# Patient Record
Sex: Female | Born: 1935 | Race: White | Hispanic: No | Smoking: Former smoker
Health system: Southern US, Community
[De-identification: ages and names within clinical notes are randomized; demographics above are authoritative.]

## PROBLEM LIST (undated history)

## (undated) DIAGNOSIS — R609 Edema, unspecified: Secondary | ICD-10-CM

## (undated) DIAGNOSIS — G589 Mononeuropathy, unspecified: Secondary | ICD-10-CM

## (undated) DIAGNOSIS — Z9289 Personal history of other medical treatment: Secondary | ICD-10-CM

## (undated) DIAGNOSIS — D649 Anemia, unspecified: Secondary | ICD-10-CM

## (undated) DIAGNOSIS — M1990 Unspecified osteoarthritis, unspecified site: Secondary | ICD-10-CM

## (undated) DIAGNOSIS — Z87442 Personal history of urinary calculi: Secondary | ICD-10-CM

## (undated) DIAGNOSIS — T4145XA Adverse effect of unspecified anesthetic, initial encounter: Secondary | ICD-10-CM

## (undated) DIAGNOSIS — I4891 Unspecified atrial fibrillation: Secondary | ICD-10-CM

## (undated) DIAGNOSIS — E7800 Pure hypercholesterolemia, unspecified: Secondary | ICD-10-CM

## (undated) DIAGNOSIS — H409 Unspecified glaucoma: Secondary | ICD-10-CM

## (undated) DIAGNOSIS — I10 Essential (primary) hypertension: Secondary | ICD-10-CM

## (undated) DIAGNOSIS — T8859XA Other complications of anesthesia, initial encounter: Secondary | ICD-10-CM

## (undated) DIAGNOSIS — I700 Atherosclerosis of aorta: Secondary | ICD-10-CM

## (undated) DIAGNOSIS — C4490 Unspecified malignant neoplasm of skin, unspecified: Secondary | ICD-10-CM

## (undated) DIAGNOSIS — R911 Solitary pulmonary nodule: Secondary | ICD-10-CM

## (undated) DIAGNOSIS — I5032 Chronic diastolic (congestive) heart failure: Secondary | ICD-10-CM

## (undated) DIAGNOSIS — I080 Rheumatic disorders of both mitral and aortic valves: Secondary | ICD-10-CM

## (undated) DIAGNOSIS — E119 Type 2 diabetes mellitus without complications: Secondary | ICD-10-CM

## (undated) HISTORY — DX: Unspecified osteoarthritis, unspecified site: M19.90

## (undated) HISTORY — DX: Essential (primary) hypertension: I10

## (undated) HISTORY — PX: SKIN CANCER EXCISION: SHX779

## (undated) HISTORY — PX: CATARACT EXTRACTION W/ INTRAOCULAR LENS IMPLANT: SHX1309

## (undated) HISTORY — DX: Edema, unspecified: R60.9

## (undated) HISTORY — DX: Atherosclerosis of aorta: I70.0

## (undated) HISTORY — DX: Unspecified atrial fibrillation: I48.91

## (undated) HISTORY — DX: Pure hypercholesterolemia, unspecified: E78.00

## (undated) HISTORY — DX: Rheumatic disorders of both mitral and aortic valves: I08.0

## (undated) HISTORY — DX: Mononeuropathy, unspecified: G58.9

## (undated) HISTORY — DX: Chronic diastolic (congestive) heart failure: I50.32

---
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
## 1998-01-10 ENCOUNTER — Other Ambulatory Visit: Admission: RE | Admit: 1998-01-10 | Discharge: 1998-01-10 | Payer: Self-pay | Admitting: *Deleted

## 1998-03-01 ENCOUNTER — Encounter (HOSPITAL_COMMUNITY): Admission: RE | Admit: 1998-03-01 | Discharge: 1998-05-30 | Payer: Self-pay | Admitting: Cardiovascular Disease

## 1998-03-22 ENCOUNTER — Ambulatory Visit (HOSPITAL_COMMUNITY): Admission: RE | Admit: 1998-03-22 | Discharge: 1998-03-22 | Payer: Self-pay | Admitting: Cardiovascular Disease

## 1998-10-08 HISTORY — PX: ABDOMINAL HYSTERECTOMY: SHX81

## 1999-02-14 ENCOUNTER — Ambulatory Visit (HOSPITAL_COMMUNITY): Admission: RE | Admit: 1999-02-14 | Discharge: 1999-02-14 | Payer: Self-pay | Admitting: Gastroenterology

## 1999-02-14 ENCOUNTER — Encounter: Payer: Self-pay | Admitting: Gastroenterology

## 1999-02-15 ENCOUNTER — Other Ambulatory Visit: Admission: RE | Admit: 1999-02-15 | Discharge: 1999-02-15 | Payer: Self-pay | Admitting: Gynecology

## 1999-02-16 ENCOUNTER — Other Ambulatory Visit: Admission: RE | Admit: 1999-02-16 | Discharge: 1999-02-16 | Payer: Self-pay | Admitting: Obstetrics & Gynecology

## 1999-03-01 ENCOUNTER — Ambulatory Visit (HOSPITAL_COMMUNITY): Admission: RE | Admit: 1999-03-01 | Discharge: 1999-03-01 | Payer: Self-pay | Admitting: Obstetrics and Gynecology

## 1999-06-15 ENCOUNTER — Encounter (INDEPENDENT_AMBULATORY_CARE_PROVIDER_SITE_OTHER): Payer: Self-pay | Admitting: Specialist

## 1999-06-15 ENCOUNTER — Ambulatory Visit (HOSPITAL_COMMUNITY): Admission: RE | Admit: 1999-06-15 | Discharge: 1999-06-15 | Payer: Self-pay | Admitting: Obstetrics and Gynecology

## 1999-08-11 ENCOUNTER — Encounter: Payer: Self-pay | Admitting: Obstetrics and Gynecology

## 1999-08-16 ENCOUNTER — Encounter (INDEPENDENT_AMBULATORY_CARE_PROVIDER_SITE_OTHER): Payer: Self-pay | Admitting: Specialist

## 1999-08-16 ENCOUNTER — Inpatient Hospital Stay (HOSPITAL_COMMUNITY): Admission: RE | Admit: 1999-08-16 | Discharge: 1999-08-18 | Payer: Self-pay | Admitting: Obstetrics and Gynecology

## 2000-09-24 ENCOUNTER — Other Ambulatory Visit: Admission: RE | Admit: 2000-09-24 | Discharge: 2000-09-24 | Payer: Self-pay | Admitting: Obstetrics and Gynecology

## 2001-09-25 ENCOUNTER — Other Ambulatory Visit: Admission: RE | Admit: 2001-09-25 | Discharge: 2001-09-25 | Payer: Self-pay | Admitting: Obstetrics and Gynecology

## 2003-12-28 ENCOUNTER — Other Ambulatory Visit: Admission: RE | Admit: 2003-12-28 | Discharge: 2003-12-28 | Payer: Self-pay | Admitting: Obstetrics and Gynecology

## 2005-08-29 ENCOUNTER — Ambulatory Visit: Payer: Self-pay | Admitting: Cardiovascular Disease

## 2005-09-07 ENCOUNTER — Ambulatory Visit: Payer: Self-pay | Admitting: Cardiovascular Disease

## 2005-09-07 ENCOUNTER — Encounter: Payer: Self-pay | Admitting: Cardiology

## 2005-09-07 ENCOUNTER — Ambulatory Visit: Payer: Self-pay

## 2005-09-10 ENCOUNTER — Ambulatory Visit: Payer: Self-pay | Admitting: Internal Medicine

## 2005-09-24 ENCOUNTER — Ambulatory Visit: Payer: Self-pay | Admitting: Cardiovascular Disease

## 2005-11-02 ENCOUNTER — Ambulatory Visit: Payer: Self-pay | Admitting: Cardiovascular Disease

## 2005-11-14 ENCOUNTER — Encounter: Admission: RE | Admit: 2005-11-14 | Discharge: 2005-11-14 | Payer: Self-pay | Admitting: Family Medicine

## 2006-02-01 ENCOUNTER — Ambulatory Visit: Payer: Self-pay | Admitting: Cardiovascular Disease

## 2006-07-11 ENCOUNTER — Ambulatory Visit: Payer: Self-pay | Admitting: Cardiovascular Disease

## 2006-08-06 ENCOUNTER — Ambulatory Visit: Payer: Self-pay

## 2007-06-05 ENCOUNTER — Ambulatory Visit: Payer: Self-pay | Admitting: Cardiovascular Disease

## 2007-10-09 HISTORY — PX: TOTAL KNEE ARTHROPLASTY: SHX125

## 2007-11-11 ENCOUNTER — Ambulatory Visit: Payer: Self-pay | Admitting: Cardiovascular Disease

## 2008-09-20 ENCOUNTER — Inpatient Hospital Stay (HOSPITAL_COMMUNITY): Admission: RE | Admit: 2008-09-20 | Discharge: 2008-09-24 | Payer: Self-pay | Admitting: Orthopedic Surgery

## 2008-12-15 ENCOUNTER — Ambulatory Visit: Payer: Self-pay | Admitting: Cardiovascular Disease

## 2009-05-07 ENCOUNTER — Encounter: Payer: Self-pay | Admitting: Cardiovascular Disease

## 2009-06-20 DIAGNOSIS — G589 Mononeuropathy, unspecified: Secondary | ICD-10-CM | POA: Insufficient documentation

## 2009-06-20 DIAGNOSIS — N200 Calculus of kidney: Secondary | ICD-10-CM

## 2009-06-20 DIAGNOSIS — I4891 Unspecified atrial fibrillation: Secondary | ICD-10-CM

## 2009-06-20 DIAGNOSIS — E7800 Pure hypercholesterolemia, unspecified: Secondary | ICD-10-CM | POA: Insufficient documentation

## 2009-06-20 DIAGNOSIS — E119 Type 2 diabetes mellitus without complications: Secondary | ICD-10-CM

## 2009-06-20 DIAGNOSIS — I10 Essential (primary) hypertension: Secondary | ICD-10-CM | POA: Insufficient documentation

## 2009-06-20 DIAGNOSIS — R609 Edema, unspecified: Secondary | ICD-10-CM | POA: Insufficient documentation

## 2009-06-20 DIAGNOSIS — I080 Rheumatic disorders of both mitral and aortic valves: Secondary | ICD-10-CM

## 2009-06-20 DIAGNOSIS — M1990 Unspecified osteoarthritis, unspecified site: Secondary | ICD-10-CM | POA: Insufficient documentation

## 2009-06-20 DIAGNOSIS — H409 Unspecified glaucoma: Secondary | ICD-10-CM | POA: Insufficient documentation

## 2009-06-21 ENCOUNTER — Ambulatory Visit: Payer: Self-pay | Admitting: Cardiovascular Disease

## 2009-06-21 DIAGNOSIS — R011 Cardiac murmur, unspecified: Secondary | ICD-10-CM

## 2009-07-05 ENCOUNTER — Ambulatory Visit: Payer: Self-pay

## 2009-07-05 ENCOUNTER — Encounter: Payer: Self-pay | Admitting: Cardiovascular Disease

## 2009-08-24 ENCOUNTER — Encounter: Admission: RE | Admit: 2009-08-24 | Discharge: 2009-08-24 | Payer: Self-pay | Admitting: Obstetrics and Gynecology

## 2010-09-12 ENCOUNTER — Encounter: Payer: Self-pay | Admitting: Cardiovascular Disease

## 2010-09-12 ENCOUNTER — Ambulatory Visit: Payer: Self-pay | Admitting: Cardiovascular Disease

## 2010-10-29 ENCOUNTER — Encounter: Payer: Self-pay | Admitting: Obstetrics and Gynecology
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
## 2011-02-20 NOTE — Assessment & Plan Note (Signed)
[facility]                            CARDIOLOGY OFFICE NOTE   ZEMA, LIZARDO                   MRN:          [id number]  DATE:11/11/2007                            DOB:          09/30/1935    Jonnelle returns today in follow-up, as we have followed her for  hypertension, atrial fibrillation and edema.   She is doing well.  She has no documented coronary disease.  Her last  Myoview of August 06, 2006 was normal, with no evidence of significant  ischemia.  Her EF was 67%.  She continues to work at [employer].  She is doing fairly well.  Her limitations do not have anything to do  with chest pain, palpitations or dyspnea.  She hurt her ankle 3 years  ago and continues to have some problems with the left ankle; she sees  Dr. Leonides Grills for this.  She also has a right knee problem, is  getting injections by Dr. Ollen Gross.   Her review of systems was otherwise remarkable for occasional easy  bleeding from her varicosities in the leg.  She had a particular problem  this morning in the shower, where she had a small venial that bled.  In  general her INRs have been therapeutic, including today at 2.4.   Her review of systems are otherwise negative.   CURRENT MEDICATIONS:  1. Digitek 0.25 daily.  2. Celebrex 200 mg b.i.d.  3. Metanx once a day.  4. Potassium.  5. Carvedilol 25 daily.  6. Cardia 240 daily.  7. Lisinopril 40 daily.  8. Glipizide 10 daily.  9. Actos 30 daily.  10.Coumadin as directed.  11.Demodex 40 Monday, Wednesday and Friday, 20 other days.  12.Lipitor 10 daily.  13.Metformin 500 b.i.d.  14.Lentis 28 units daily.   PHYSICAL EXAMINATION:  Her exam is remarkable for an elderly white  female in no distress.  She has her trademark bright red hair.  Weight  163, blood pressure 138/86.  She is in atrial fibrillation at a rate of  80, respiratory 14, afebrile.  HEENT:  Unremarkable.  NECK:  Carotids are without  bruit, no lymphadenopathy, thyromegaly or  JVP elevation.  LUNGS:  Clear to diaphragmatic motion.  No wheezing.  S1 and S2 are  normal.  Normal heart sounds.  PMI normal.  ABDOMEN:  Benign.  Bowel sounds positive.  No AAA.  No tenderness.  No  hepatosplenomegaly or hepatojugular reflux.  EXTREMITIES:  Distal pulses are intact.  She has trace edema  bilaterally, with marked varicosities particularly behind the left knee  NEUROLOGIC:  Nonfocal.  SKIN:  Warm and dry.   EKG:  Shows atrial fibrillation with digoxin-effect.   IMPRESSION:  1. CHRONIC ATRIAL FIBRILLATION.  Rate control and anticoagulation      going well.  Continue current doses of Digoxin, beta blocker and      calcium blocker.  To follow up in the Coumadin Clinic every 6      weeks.  2. HYPERTENSION.  Currently well controlled.  Continue current      medications, low-salt diet.  3. LOWER EXTREMITY  VARICOSITIES.  I have asked Makayle to see Dr.      Consuello Closs or CVTS in regards to sclerosis.  I think this      would be helpful, particularly in light of the fact she      occasionally has a venial that bleeds.  She has fairly marked      varicosities behind the left knee, and is at risk for      thrombophlebitis.  She does wear support hose, and she will think      about it.  4.  MULTIPLE ORTHOPEDIC PROBLEMS.  Follow up with Dr.      Homero Fellers Aluisio and Dr. Leonides Grills.Meriam Sprague would be a candidate      for knee surgery, if she has to have a replacement.  She was      limping a bit today and she will continue her Celebrex as an anti-      inflammatory; since it works well.  As there is no documented      coronary disease, I will see her back in a year.     Noralyn Pick. Eden Emms, MD, [facility]  Electronically Signed    PCN/MedQ  DD: 11/11/2007  DT: 11/11/2007  Job #: [id number]   cc:   Ollen Gross, M.D.  Leonides Grills, M.D.

## 2011-02-20 NOTE — Op Note (Signed)
NAMETONEE, SILVERSTEIN            ACCOUNT NO.:  [id number]   MEDICAL RECORD NO.:  [id number]          PATIENT TYPE:  INP   LOCATION:  0008                         FACILITY:  [facility]   PHYSICIAN:  Ollen Gross, M.D.    DATE OF BIRTH:  April 18, 1935   DATE OF PROCEDURE:  09/20/2008  DATE OF DISCHARGE:                               OPERATIVE REPORT   PREOPERATIVE DIAGNOSIS:  Osteoarthritis, right knee.   POSTOPERATIVE DIAGNOSIS:  Osteoarthritis, right knee.   PROCEDURE:  Right total knee arthroplasty.   SURGEON:  Ollen Gross, M.D.   ASSISTANT:  Jamelle Rushing, P.A.   ANESTHESIA:  Spinal.   ESTIMATED BLOOD LOSS:  Minimal.   DRAIN:  None.   TOURNIQUET TIME:  37 minutes at 300 mmHg.   COMPLICATIONS:  None.   CONDITION.:  Stable to recovery.   BRIEF CLINICAL NOTE:  Ms. Isaac is a 76 year old female with severe  end-stage arthritis of the right knee with progressively worsening pain  and dysfunction.  She has failed nonoperative management and presents  now for right total knee arthroplasty.   PROCEDURE IN DETAIL:  After successful administration of spinal  anesthetic, a tourniquet was placed on the right thigh,  and right lower  extremity prepped and draped in the usual sterile fashion.  Extremity  was wrapped in Esmarch, knee flexed, tourniquet inflated to 300 mmHg.  Midline incision was made with a 10-blade through subcutaneous tissue to  the level of the extensor mechanism.  A fresh blade was used to make a  medial parapatellar arthrotomy.  Soft tissue over the proximal medial  tibia is subperiosteally elevated to the joint line with the knife and  into the semimembranosus bursa with a Cobb elevator.  Soft tissue  laterally is elevated with attention being paid to avoid the patellar  tendon on tibial tubercle.  Patella was subluxed laterally, knee flexed  90 degrees, and ACL and PCL removed.  Drill was used to create a  starting hole in the distal femur and the canal  was thoroughly  irrigated.  The 5-degree right valgus alignment guide was placed with  referencing of the posterior condyles and the blocks pinned to remove 11  mm off the distal femur.  Distal femoral resection is made with an  oscillating saw.  I took 11 because of preop flexion contracture.  Sizing blocks were placed, size 4 is most appropriate AP but 3 medial  and lateral, thus a 4 narrow will be the appropriate component.  The  size 4 cutting block was placed and the anterior, posterior and chamfer  cuts are made.  The rotation for the cuts was made off the epicondylar  axis.   The tibia was subluxed forward and menisci removed.  Extramedullary  tibial alignment guide is placed referencing proximally at the medial  aspect of the tibial tubercle and distally along the second metatarsal  axis and tibial crest.  The block was pinned to remove about 2 mm from  the more deficient medial side.  The medial side was fairly deficient.  Tibial resection is made with an oscillating saw.  Sizing block  was  placed, size 3 is most appropriate.  The proximal tibia is then prepared  with the modular drill and keel punch for the size 3.  Femoral  preparation is completed with the intercondylar cut for the size 4.   Size 3 mobile bearing tibial trial, size 4 narrow posterior stabilized  femoral trial and a 12.5-mm posterior stabilized rotating platform  insert trial are placed.  There was a little play with the 12.5 so I  went to the 15 which allowed for full extension with excellent varus-  valgus, anterior and posterior balance throughout full range of motion.  We needed the 15 because of the neck tibial cut to get down to the base  of medial defect.  Patella was then everted and thickness measured to be  24 mm.  Freehand resection was taken to 14 mm, 38 template was placed,  lug holes were drilled, trial patella was placed and it tracks normally.  Osteophytes were removed off the posterior femur  with the trial in  place.  All trials were removed and then the cut bone surfaces are  prepared with pulsatile lavage.  Cement was mixed and once ready for  implantation the size 3 mobile bearing tibial tray, size 4 narrow  posterior stabilized femur and 38 patella are cemented into place and  the patella was held with a clamp.  The trial 15-mm insert was placed,  the knee held in full extension, and all extruded cement removed.  When  the cement was fully hardened and the knee permanent, a 15-mm posterior  stabilized rotating platform insert is placed into the tibial tray.  The  wound was then copiously irrigated with saline solution and the FloSeal  injected on the posterior capsule, medial and lateral gutters and  suprapatellar area.  Moist sponge is placed and tourniquet released with  a total time of 37 minutes.  The sponge was held for 2 minutes and then  removed.  Minimal bleeding was encountered.  The bleeding that is  encountered is stopped with electrocautery.  Wounds again irrigated and  then the arthrotomy closed with interrupted #1 PDS.  Flexion against  gravity is 135 degrees.  Subcu was closed with interrupted 2-0 Vicryl,  subcuticular running 4-0 Monocryl.  The incision is cleaned and dried  and Steri-Strips and a bulky sterile dressing are applied.  She was then  placed into a knee immobilizer, awakened, and transported to recovery in  stable condition.      Ollen Gross, M.D.  Electronically Signed     FA/MEDQ  D:  09/20/2008  T:  09/21/2008  Job:  [id number]

## 2011-02-20 NOTE — Assessment & Plan Note (Signed)
[facility]                            CARDIOLOGY OFFICE NOTE   BRADLEE, BRIDGERS                   MRN:          [id number]  DATE:12/15/2008                            DOB:          [birth year]    Destiny [name] returns today for followup.  She has history of hypertension,  MR, atrial fibrillation, lower extremity edema with significant lower  extremity varicosities.  She has been doing well.  She has no documented  coronary disease.  I believe her last Myoview was in 2007 and was  normal.  She is not having chest pain.   Since I last saw her, she had a right knee replaced by Dr. Lequita Halt.   She continues to take p.r.n. Celebrex.  Her rehab has gone well.  She is  walking much better.   She is on chronic Coumadin for atrial fibrillation.  She did not have  any significant problems in regards to DVTs.   Her INRs have been therapeutic.  In regards to her coronary risk  factors, her hypertension is well controlled.  She is on Lipitor for  hypercholesterolemia and oral hypoglycemics.   She needs a followup hemoglobin A1c.   The patient has not had any significant chest pain.  She does not have  palpitations despite being in chronic AFib.  She has mild exertional  dyspnea which is stable.  She has had some MR in the past.  Her last  echo done September 10, 2005, showed an EF of 40-50% with mild mitral  regurgitation.   Since her symptoms have been stable and her EF was 67% by Myoview, we  have not repeated our assessment of LV function.   REVIEW OF SYSTEMS:  Otherwise, remarkable for occasional pain in her  knee, chronic varicosities, and varicose veins in both lower  extremities.  No recent phlebitis.  Improved appetite.  No weight loss.  No palpitations, syncope, PND, or orthopnea.  Trace lower extremity  edema.  No melena.  No bleeding diathesis, on Coumadin.  No change in  mental status.  No TIA or CVA.   Her meds include:  1. Digoxin 0.25 a  day.  2. Cosopt.  3. Klor-Con 20 b.i.d.  4. Metformin 2 g nightly.  5. Carvedilol 25 b.i.d.  6. Hydrochlorothiazide 12.5 a day.  7. Cinnamon.  8. Fish oil.  9. Metanx.  10.Cartia 240 a day.  11.Lisinopril 40 a day.  12.Glipizide 10 a day.  13.Actos 30 a day.  14.Magnesium.  15.Multivitamins.  16.Coumadin as directed.  17.Lipitor 10 a day.  18.Lantus 28 units at night.   PHYSICAL EXAMINATION:  GENERAL:  Remarkable for an animated female with  red hair.  VITAL SIGNS:  Her weight is 164, blood pressure 140/80, pulse 68 and  irregular, respiratory rate 14, afebrile.  HEENT:  Unremarkable.  NECK:  Carotids normal without bruit.  No lymphadenopathy, thyromegaly,  or JVP elevation.  LUNGS:  Clear.  Good diaphragmatic motion.  No wheezing.  HEART:  S1 and S2 with normal heart sounds.  PMI normal.  ABDOMEN:  Benign.  Bowel sounds positive.  No AAA.  No tenderness.  No  bruit.  No hepatosplenomegaly, hepatojugular reflux, or tenderness.  EXTREMITIES:  Distal pulses are intact.  Marked varicosities in both  lower extremities with recent right knee replacement.  NEUROLOGIC:  Nonfocal.  SKIN:  Warm and dry.  MUSCULOSKELETAL:  No muscular weakness.   EKG shows atrial fibrillation with dig effect, nonspecific ST-T wave  changes.   IMPRESSION:  1. Chronic atrial fibrillation.  Good rate control.  Continue      anticoagulation with Coumadin.  Followup in the clinic.  Currently      asymptomatic.  2. Mitral insufficiency.  Murmur not evident on exam.  No need for      followup at this time.  No need for subacute bacterial endocarditis      prophylaxis.  3. History of decreased left ventricular function in the setting of      atrial fibrillation and lower extremity edema.  Symptoms currently      stable.  Consider followup evaluation of LV function by echo in a      year.  4. Superficial varicosities, marked, referred to Dr. Donia Ast in the      [facility].  The patient is somewhat  hesitant to do this since it      is not likely to be covered by insurance.  Long-term risk of      thrombophlebitis, lower extremity edema, and deep venous thrombosis      were discussed.  5. Diabetes.  Followup with primary care MD.  Hemoglobin A1c to be      done quarterly.  6. Status post right knee replacement.  Continue to follow up with      Physical Therapy at Dr. Deri Fuelling office.  7. Lower extremity edema, improved.  Continue hydrochlorothiazide and      potassium replacement.  These were secondary to venous      insufficiency.  Elevate legs at the end of the day.  Low-salt diet.  8. Neuropathy.  Continue Metanx secondary to diabetes.  No nonhealing      ulcers.  No evidence of significant peripheral vascular disease in      regards to her lower extremity symptoms.  9. Abnormal EKG secondary to chronic atrial fibrillation and dig      effect.  No evidence of ischemia with negative Myoview in 2007 and      no evidence of chest pain.      Noralyn Pick. Eden Emms, MD, [facility]  Electronically Signed    PCN/MedQ  DD: 12/15/2008  DT: 12/16/2008  Job #: [id number]

## 2011-02-20 NOTE — H&P (Signed)
Destiny [name], Destiny [name]            ACCOUNT NO.:  [id number]   MEDICAL RECORD NO.:  [id number]          PATIENT TYPE:  INP   LOCATION:                               FACILITY:  [name]   PHYSICIAN:  Ollen Gross, M.D.    DATE OF BIRTH:  [birth year]   DATE OF ADMISSION:  09/10/2008  DATE OF DISCHARGE:                              HISTORY & PHYSICAL   DATE OF OFFICE VISIT HISTORY AND PHYSICAL:  August 26, 2008.   DATE OF ADMISSION:  September 20, 2008.   CHIEF COMPLAINT:  Right knee pain.   HISTORY OF PRESENT ILLNESS:  The patient is a 76 year old female who has  been seen by Dr. Lequita Halt for ongoing right knee pain.  It has been  ongoing for several years now abd has gotten worse with time.  She has  had cortisone injections in the past.  She has also had an arthroscopy  Dr. Luiz Blare back in 2004, which showed arthritis, really did not provide  her any long-term benefit.  She has been seen by Dr. Lequita Halt, found to  have end-stage arthritis, felt to be a good candidate for surgery.  Risks and benefits have been discussed and she elects to proceed with  surgery.  She has been seen preoperatively by Dr. Charlton Haws.  It is  felt she would be a candidate for knee surgery.   ALLERGIES:  No known drug allergies.   CURRENT MEDICATIONS:  Actos, metformin, Lipitor, Xalatan, Cosopt,  Lantus, calcium plus D, magnesium, Darvocet, Coumadin, glipizide ER,  lisinopril, potassium, hydrochlorothiazide, Diltiazem, digoxin,  carvedilol, multivitamin, vitamin E and cinnamon.   PAST MEDICAL HISTORY:  1. Hypertension.  2. Coronary arterial disease.  3. Hypercholesterolemia.  4. Glaucoma.  5. Past history of cataracts.  6. Insulin dependent diabetes mellitus.  7. Renal calculi.  8. Chronic atrial fib.   PAST SURGICAL HISTORY:  Tonsillectomy, appendectomy, hysterectomy,  cataract surgery, removal of toenails off great toes.   FAMILY HISTORY:  Noncontributory.   SOCIAL HISTORY:  Widow, past smoker.   Lives alone.  She does have a  caregiver lined up after surgery.  She lives in a [place] home.  She  does have a living will.   REVIEW OF SYSTEMS:  GENERAL:  No fevers, chills or night sweats.  NEURO:  No seizures, syncope or paralysis.  RESPIRATORY:  No shortness of  breath, productive cough or hemoptysis.  CARDIOVASCULAR:  No chest pain  or orthopnea.  GI:  No nausea, vomiting, diarrhea, constipation.  GU:  No dysuria, hematuria or discharge.  MUSCULOSKELETAL:  Right knee.   PHYSICAL EXAM:  VITAL SIGNS:  Pulse 54, respirations 12, blood pressure  is 158/62.  GENERAL:  76 year old white female well-nourished, well-developed, in no  acute distress.  She is alert, oriented and cooperative.  She is  accompanied by her friend.  HEENT:  Normocephalic, atraumatic.  Pupils are round and reactive.  Oropharynx clear.  EOMs intact.  She does have full upper and lower  denture plates.  NECK:  Supple.  CHEST:  Clear.  HEART:  Regular rate and rhythm with a grade 2/6 systolic  ejection  murmur best heard over the aortic point.  ABDOMEN:  Soft, nontender.  Bowel sounds present.  RECTAL/BREASTS/GENITALIA:  Not done.  Not pertinent to present illness.  EXTREMITIES:  Right knee:  No effusion.  Range of motion 10-115, marked  crepitus, no instability.   IMPRESSION:  Osteoarthritis right knee.   PLAN:  The patient will be admitted to [facility] and undergo  a right total knee replacement arthroplasty.  Surgery will be performed  by Dr. Ollen Gross.  Dr. Eden Emms is her cardiologist.  He will be  notified of the room on admission and be consulted if needed for any  assistance with the patient throughout the hospital course.      Alexzandrew L. Perkins, P.A.C.      Ollen Gross, M.D.  Electronically Signed    ALP/MEDQ  D:  09/19/2008  T:  09/19/2008  Job:  [id number]   cc:   Ollen Gross, M.D.  Fax: [phone]   Noralyn Pick. Eden Emms, MD, [facility]  [address]  [address]   [place]  [place] [address]   Doris Cheadle. Foy Guadalajara, M.D.  Fax: [phone]

## 2011-02-20 NOTE — Assessment & Plan Note (Signed)
[facility]                            CARDIOLOGY OFFICE NOTE   ANAALICIA, REIMANN                   MRN:          [id number]  DATE:06/05/2007                            DOB:          05/25/1935    Arihana returns today for followup.  She is doing fairly well.  I  primarily follow her for chronic a-fib.  I tend to see her at [employer] where she works.   She has not had any significant palpitations, PND or orthopnea.  There  has been no chest pain.  She has mild chronic lower extremity edema  which is stable.   She has been seen in the Coumadin clinic on a regular basis.  Her INRs  have been therapeutic.   She has a baseline abnormal EKG which shows partially dig-effect, but  also inferolateral T-wave changes.  Her last Myoview study was done on  August 06, 2006 and was normal with no evidence of coronary artery  disease.  She did have a catheterization in 1999 which was normal.  She  has had fluctuating ejection fractions in the past which have been as  low as 35%.  We think that this is somehow related to rate control.  Her  last ejection fraction was low normal in the 50% range.   She has not had any significant congestive heart failure symptoms.  I  have talked to her in the past about getting off Actos, but her primary  care MD has not done this.   REVIEW OF SYSTEMS:  Otherwise remarkable for significant right knee  pain.  She has had some steroid injections by Dr. Lequita Halt.  She may need  surgery in the future.  She needs to see him in followup in a month or  two.   MEDICATIONS:  1. Cartia 240 a day.  2. Lisinopril 40 a day.  3. Glipizide 10 a day.  4. Actos 30 a day.  5. Magnesium.  6. Coreg 12.5 b.i.d.  7. Coumadin as directed.  8. Demadex varying dosages.  9. Lipitor 10 a day.  10.Metformin 500 mg nightly.  11.Lantus 28 a day.   PHYSICAL EXAMINATION:  An elderly white female in no distress.  Her  weight is 162,  blood pressure is 130/80, pulse is 80 and irregular.  Respiratory rate is 14, she is afebrile.  HEENT:  Normal.  Carotids are normal without bruits.  There is no lymphadenopathy.  No  thyromegaly, no JVP elevation.  LUNGS:  Clear with good diaphragmatic motion and wheezing.  There is an S1 and S2 with normal heart sounds.  PMI is normal.  ABDOMEN:  Benign.  Bowel sounds positive.  No tenderness, no  hepatosplenomegaly, no hepatojugular reflux, no triple A, no renal  bruits.  Femorals are +3 bilaterally without bruit.  PTs are +2.  There is trace  lower extremity edema with minor varicosities.  NEURO:  Nonfocal.  SKIN:  Warm and dry.  There is no muscular weakness.   EKG shows atrial fibrillation with inferolateral T-wave changes.  Poor R-  wave progression.  Possible dig-effect.  IMPRESSION:  1. Atrial fibrillation, good rate control on calcium blockers and beta      blocker.  Currently asymptomatic.  Continue Coumadin      anticoagulation.  2. Baseline abnormal EKG, worse than would be expected for just dig-      effect.  She has had normal catheterization many years ago and a      nonischemic Myoview in 2007.  We will continue to follow this, but      I think the EKG is stable.  3. History of mild volume overload in the setting of decreased      ejection fraction.  Likely nonischemic cardiomyopathy.  Continue      current dose of diuretics and angiotensin-converting enzyme      inhibitor.  Currently appears euvolemic.  4. Mild lower extremity edema, improved, made worse by varicosities.      Continue elevation of the legs as much as possible, although this      is hard when she works at [employer].  Continue current dose of      diuretic.  5. Diabetes.  She will talk to Dr. Foy Guadalajara in regards to adjusting her      medications.  I prefer her to be off Actos in terms of her volume      status with lower extremity edema and decreased left ventricular      function.  Hemoglobin  A1c in 3 months.  6. Possible need for right knee surgery.  The patient would have to      have her Coumadin stopped 4-5 days before surgery.  She is      currently cleared for any surgery that may need to take place in      the next year.  She will see Dr. Lequita Halt in regards to any further      prednisone injections.  I will see her back in 6 months.     Noralyn Pick. Eden Emms, MD, [facility]  Electronically Signed    PCN/MedQ  DD: 06/05/2007  DT: 06/06/2007  Job #: [id number]

## 2011-02-23 NOTE — Assessment & Plan Note (Signed)
[facility]                              CARDIOLOGY OFFICE NOTE   Destiny, [name]                   MRN:          [id number]  DATE:07/11/2006                            DOB:          [birth year]    Destiny [name] returns today for followup.  She has chronic atrial fibrillation on  anticoagulation.  She does not like being on Coumadin.  I think she is a  candidate for the ROCKET trial since she is hypertensive and diabetic.  She  has no known coronary disease.  She has not had a stress test in about 4  years.  Given the proclivity for coronary disease in diabetics, I think it  is reasonable to do an adenosine Myoview on her.  She cannot exercise due to  previous arthroscopic surgery in the right knee and left ankle problems.   From a cardiac standpoint, she has been stable.  She had has previous  congestive heart failure with low-normal EF and diastolic dysfunction.  She  is not having PND, orthopnea or lower extremity edema.  Her atrial  fibrillation is well rate controlled on Cartia 240 a day and Coreg.  We have  placed her on Coreg due to her diabetes and decreased insulin resistance.   She is hyperlipidemic and is on 10 of Lipitor.   Her last hemoglobin A1c was equal to 6.9.   Her review of systems otherwise remarkable for some continued left ankle  pain which she is seeing Dr. Lestine Box for.   EXAMINATION:  She is in atrial fibrillation, rate of 70.  Blood pressure is  120/70.  Lungs are clear, carotids are normal.  There is an S1, S2 with  normal heart sounds.  She does have a soft systolic murmur in the right  lateral sternal border.  Abdomen is benign.  Lower extremities with intact  pulses, no edema.  There are some varicosities in the legs.  There is no  thyromegaly, there is no lymphadenopathy.  Pupils are equal, round, and  reactive to light and accommodation.  She did just have some laser surgery  in her left eye and sees [facility] for this.   MEDICATIONS:  Listed in the chart.   IMPRESSION:  Stable chronic atrial fibrillation on Coumadin.  We will see if  she is a candidate for the ROCKET trial.  We will continue her Coreg and  Cartia for rate control.  Her blood pressure is well controlled on  lisinopril.  She has not had any recurrent congestive heart failure.  Activity primarily limited by orthopedic problems.  Continue to be careful  with fluid retention on Actos.  She is requiring 20 of Demodex a day.   As long as her Myoview is normal I will see her back in about 6 months.  Her  EKG today showed atrial fibrillation and nonspecific ST-T wave changes.            ______________________________  Noralyn Pick Eden Emms, MD, [facility]     PCN/MedQ  DD:  07/11/2006  DT:  07/12/2006  Job #:  [id number]

## 2011-02-23 NOTE — Discharge Summary (Signed)
NAMESAVITA, Destiny [name]            ACCOUNT NO.:  [id number]   MEDICAL RECORD NO.:  [id number]          PATIENT TYPE:  INP   LOCATION:  1601                         FACILITY:  [facility]   PHYSICIAN:  Ollen Gross, M.D.    DATE OF BIRTH:  05-11-1935   DATE OF ADMISSION:  09/20/2008  DATE OF DISCHARGE:  09/24/2008                               DISCHARGE SUMMARY   ADMITTING DIAGNOSES:  1. Osteoarthritis right knee.  2. Hypertension.  3. Hypercholesterolemia.  4. Glaucoma.  5. Past history of cataracts.  6. Insulin dependent diabetes mellitus.  7. Renal calculi.  8. Chronic atrial fibrillation.   DISCHARGE DIAGNOSES:  1. Osteoarthritis right knee status post right total knee replacement      arthroplasty.  2. Mild postop blood loss anemia, did not require transfusion.  3. Postop hypokalemia improved.  4. Hypertension.  5. Hypercholesterolemia.  6. Glaucoma.  7. Past history of cataracts.  8. Insulin dependent diabetes mellitus.  9. Renal calculi.  10.Chronic atrial fibrillation.   PROCEDURE:  On September 20, 2008, right total knee.  Surgeon Dr.  Lequita Halt.  Assistant, Oneida Alar PA-C.  Anesthesia spinal.  Consults none.   BRIEF HISTORY:  Destiny [name] is a 76 year old female with severe end-  stage arthritis right knee, progressive worsening pain, dysfunction.  Failed operative management, now presents for right total knee  arthroplasty.   LABORATORY DATA:  Preop CBC hemoglobin 12.3, hematocrit 37.2, white cell  count 6.7, platelets 208.  Postop hemoglobin 10.8 and then down to 10.5,  drift down to 9.8, last H&H was 9.7 and 28.6.  PT/PTT preop 15.8 and 32  respectively.  INR 1.2.  Serial protimes followed per Coumadin protocol.  Last PT/INR 23.2 and 1.9.  Chem panel on admission, elevated glucose of  205, low albumin of 3.4.  Remaining Chem panel within normal limits.  Serial B mets were followed.  Glucose came down to 131, potassium  dropped from 4.4 to 3.2 back up to 3.6.   Remaining electrolytes remained  within normal limits.  Preop UA small bili, trace ketones otherwise  negative.  Cloudy appearance.  Blood group type A+.   STUDIES:  EKG September 15, 2008, atrial fib with slow ventricular  response, incomplete right bundle branch block, anterior septal infarct  age undetermined, ST and T-wave abnormalities since last tracing rate is  slower confirmed by Dr. Peter Swaziland.   Chest x-ray two-view September 15, 2008, moderate enlargement, cardiac  silhouette no acute processes identified.   HOSPITAL COURSE:  The patient admitted to [facility],  tolerated procedure well, later transferred to the recovery room on  orthopedic floor.  Started on PCA and p.o. analgesic pain control  following surgery.  Given 24 hours postop IV antibiotics.  Started on  Coumadin for DVT prophylaxis.  Did have fair amount of pain through the  evening, was doing a little bit better, increased the PCA.  Encouraged  p.o. medications.  She had chronic a fib, so she started back on her  better blockers and digoxin, rate was in the 90s.  She had diabetes so  she was on sliding  scale and had other medications for diabetes.  Potassium was a little low, probably two delusional components.  We put  her on potassium due to the atrial fib and chronic Coumadin.  She was  started on a Lovenox bridge postop until the INR was therapeutic.  She  was placed back on Coumadin.  Started getting up out of bed on day #1,  by day #2, she was walking about 10-15 feet.  Incision looked good.  We  changed the dressing, doing better, pain was under better control.  Potassium was back up to 3.6.  INR was at 2.0, so we discontinued the  Lovenox bridge, continued her therapy.  By day #3, she continued to  progress well, but had not met all of her goals, only walking about 12  feet the morning.  She did a little bit better that afternoon about 75  feet.  Hemoglobin was 9.8.  She was asymptomatic.  Rate  was in the 80s.  She was therapeutic, needed one more day of therapy, and by postop day  #4, 09/24/08, the patient was doing well.  Hemoglobin essentially  stabilized.  It was 9.8 the day before and 9.7 on the date of discharge.  We did place her on iron, rate was controlled in the 90s.  She was  already back on her home medications.  She had a little bit of mild  headache, but other than that, doing well with her therapy and was  discharged home.   DISCHARGE/PLAN:  1. The patient discharged home on 09/24/2008.  2. Discharge diagnoses, please see above.  3. Discharge medication:  Coumadin, Nu-Iron, Percocet, Robaxin.  4. Follow-up last week of December.  Call the office for an      appointment.  5. Diet, heart-healthy diet.  6. Activity:  Weightbearing as tolerated, total knee protocol.  Home      health PT, home health nursing.   DISPOSITION/CONDITION ON DISCHARGE:  Improved.      Destiny [name], P.A.C.      Ollen Gross, M.D.  Electronically Signed    ALP/MEDQ  D:  11/04/2008  T:  11/04/2008  Job:  [id number]   cc:   Ollen Gross, M.D.  Fax: [phone]   Noralyn Pick. Eden Emms, MD, [facility]  [address]  [address]  [place]  [place] [address]   Doris Cheadle. Foy Guadalajara, M.D.  Fax: [phone]

## 2011-03-08 ENCOUNTER — Encounter: Payer: Self-pay | Admitting: Cardiovascular Disease

## 2011-03-19 ENCOUNTER — Other Ambulatory Visit (HOSPITAL_COMMUNITY): Payer: Self-pay | Admitting: Cardiovascular Disease

## 2011-03-19 DIAGNOSIS — I350 Nonrheumatic aortic (valve) stenosis: Secondary | ICD-10-CM

## 2011-03-20 ENCOUNTER — Ambulatory Visit (HOSPITAL_COMMUNITY): Payer: Medicare Other | Attending: Family Medicine | Admitting: Radiology

## 2011-03-20 ENCOUNTER — Ambulatory Visit (INDEPENDENT_AMBULATORY_CARE_PROVIDER_SITE_OTHER): Payer: Self-pay | Admitting: Cardiovascular Disease

## 2011-03-20 ENCOUNTER — Encounter: Payer: Self-pay | Admitting: Cardiovascular Disease

## 2011-03-20 DIAGNOSIS — I4891 Unspecified atrial fibrillation: Secondary | ICD-10-CM

## 2011-03-20 DIAGNOSIS — E119 Type 2 diabetes mellitus without complications: Secondary | ICD-10-CM | POA: Insufficient documentation

## 2011-03-20 DIAGNOSIS — R011 Cardiac murmur, unspecified: Secondary | ICD-10-CM | POA: Insufficient documentation

## 2011-03-20 DIAGNOSIS — R609 Edema, unspecified: Secondary | ICD-10-CM

## 2011-03-20 DIAGNOSIS — I080 Rheumatic disorders of both mitral and aortic valves: Secondary | ICD-10-CM

## 2011-03-20 DIAGNOSIS — I10 Essential (primary) hypertension: Secondary | ICD-10-CM

## 2011-03-20 DIAGNOSIS — I350 Nonrheumatic aortic (valve) stenosis: Secondary | ICD-10-CM

## 2011-03-20 DIAGNOSIS — I079 Rheumatic tricuspid valve disease, unspecified: Secondary | ICD-10-CM | POA: Insufficient documentation

## 2011-03-20 DIAGNOSIS — E7800 Pure hypercholesterolemia, unspecified: Secondary | ICD-10-CM

## 2011-03-20 NOTE — Assessment & Plan Note (Signed)
Cholesterol is at goal.  Continue current dose of statin and diet Rx.  No myalgias or side effects.  F/U  LFT's in 6 months. No results found for this basename: LDLCALC             

## 2011-03-20 NOTE — Assessment & Plan Note (Signed)
Mild AS and trivial MR on echo today. F/U echo in 2 years

## 2011-03-20 NOTE — Assessment & Plan Note (Signed)
Good rate control and anticoagulation.  F/U INR Destiny [name]
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
## 2011-03-20 NOTE — Assessment & Plan Note (Signed)
Well controlled.  Continue current medications and low sodium Dash type diet.    

## 2011-07-13 LAB — GLUCOSE, CAPILLARY
Glucose-Capillary: 104 mg/dL — ABNORMAL HIGH (ref 70–99)
Glucose-Capillary: 117 mg/dL — ABNORMAL HIGH (ref 70–99)
Glucose-Capillary: 120 mg/dL — ABNORMAL HIGH (ref 70–99)
Glucose-Capillary: 123 mg/dL — ABNORMAL HIGH (ref 70–99)
Glucose-Capillary: 136 mg/dL — ABNORMAL HIGH (ref 70–99)
Glucose-Capillary: 154 mg/dL — ABNORMAL HIGH (ref 70–99)
Glucose-Capillary: 165 mg/dL — ABNORMAL HIGH (ref 70–99)
Glucose-Capillary: 166 mg/dL — ABNORMAL HIGH (ref 70–99)
Glucose-Capillary: 171 mg/dL — ABNORMAL HIGH (ref 70–99)
Glucose-Capillary: 205 mg/dL — ABNORMAL HIGH (ref 70–99)

## 2011-07-13 LAB — CBC
HCT: 28.6 % — ABNORMAL LOW (ref 36.0–46.0)
HCT: 31 % — ABNORMAL LOW (ref 36.0–46.0)
HCT: 32.6 % — ABNORMAL LOW (ref 36.0–46.0)
Hemoglobin: 10.5 g/dL — ABNORMAL LOW (ref 12.0–15.0)
Hemoglobin: 12.3 g/dL (ref 12.0–15.0)
Hemoglobin: 9.8 g/dL — ABNORMAL LOW (ref 12.0–15.0)
MCHC: 33.3 g/dL (ref 30.0–36.0)
MCV: 95.8 fL (ref 78.0–100.0)
Platelets: 158 10*3/uL (ref 150–400)
Platelets: 177 10*3/uL (ref 150–400)
RBC: 3.9 MIL/uL (ref 3.87–5.11)
RDW: 14 % (ref 11.5–15.5)
RDW: 14.3 % (ref 11.5–15.5)
WBC: 10 10*3/uL (ref 4.0–10.5)
WBC: 12.8 10*3/uL — ABNORMAL HIGH (ref 4.0–10.5)

## 2011-07-13 LAB — BASIC METABOLIC PANEL
BUN: 7 mg/dL (ref 6–23)
Chloride: 99 mEq/L (ref 96–112)
GFR calc non Af Amer: >60 mL/min (ref >60)
GFR calc non Af Amer: >60 mL/min (ref >60)
Glucose, Bld: 113 mg/dL — ABNORMAL HIGH (ref 70–99)
Potassium: 3.2 mEq/L — ABNORMAL LOW (ref 3.5–5.1)
Potassium: 3.6 mEq/L (ref 3.5–5.1)
Sodium: 137 mEq/L (ref 135–145)

## 2011-07-13 LAB — URINALYSIS, ROUTINE W REFLEX MICROSCOPIC
Nitrite: NEGATIVE
Specific Gravity, Urine: 1.031 — ABNORMAL HIGH (ref 1.005–1.030)
pH: 5.5 (ref 5.0–8.0)

## 2011-07-13 LAB — PROTIME-INR
INR: 1.9 — ABNORMAL HIGH (ref 0.00–1.49)
INR: 2.1 — ABNORMAL HIGH (ref 0.00–1.49)
Prothrombin Time: 15.8 seconds — ABNORMAL HIGH (ref 11.6–15.2)
Prothrombin Time: 23.2 seconds — ABNORMAL HIGH (ref 11.6–15.2)

## 2011-07-13 LAB — COMPREHENSIVE METABOLIC PANEL
ALT: 18 U/L (ref 0–35)
Alkaline Phosphatase: 46 U/L (ref 39–117)
CO2: 28 mEq/L (ref 19–32)
Glucose, Bld: 205 mg/dL — ABNORMAL HIGH (ref 70–99)
Potassium: 4.4 mEq/L (ref 3.5–5.1)
Sodium: 141 mEq/L (ref 135–145)
Total Protein: 6.7 g/dL (ref 6.0–8.3)

## 2011-07-13 LAB — TYPE AND SCREEN: Antibody Screen: NEGATIVE

## 2011-07-13 LAB — ABO/RH: ABO/RH(D): A POS

## 2011-09-17 ENCOUNTER — Ambulatory Visit (INDEPENDENT_AMBULATORY_CARE_PROVIDER_SITE_OTHER): Payer: Medicare Other | Admitting: Cardiovascular Disease

## 2011-09-17 ENCOUNTER — Encounter: Payer: Self-pay | Admitting: Cardiovascular Disease

## 2011-09-17 DIAGNOSIS — E7800 Pure hypercholesterolemia, unspecified: Secondary | ICD-10-CM

## 2011-09-17 DIAGNOSIS — I4891 Unspecified atrial fibrillation: Secondary | ICD-10-CM

## 2011-09-17 DIAGNOSIS — E119 Type 2 diabetes mellitus without complications: Secondary | ICD-10-CM

## 2011-09-17 DIAGNOSIS — I10 Essential (primary) hypertension: Secondary | ICD-10-CM

## 2011-09-17 DIAGNOSIS — R011 Cardiac murmur, unspecified: Secondary | ICD-10-CM

## 2011-09-17 MED ORDER — RIVAROXABAN 20 MG PO TABS: 20.0000 mg | ORAL_TABLET | Freq: Every day | ORAL | Status: DC

## 2011-09-17 NOTE — Assessment & Plan Note (Signed)
Cholesterol is at goal.  Continue current dose of statin and diet Rx.  No myalgias or side effects.  F/U  LFT's in 6 months. No results found for this basename: LDLCALC             

## 2011-09-17 NOTE — Assessment & Plan Note (Signed)
Well controlled.  Continue current medications and low sodium Dash type diet.    
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
## 2011-09-17 NOTE — Assessment & Plan Note (Signed)
Discussed low carb diet.  Target hemoglobin A1c is 6.5 or less.  Continue current medications. F.U Dr Foy Guadalajara

## 2011-09-17 NOTE — Assessment & Plan Note (Signed)
Rate contorl ok  Change to [facility] for anticoagulation  F/U with me 1 month

## 2011-09-17 NOTE — Patient Instructions (Signed)
Your physician recommends that you schedule a follow-up appointment in:  1 MONTH WITH DR [facility] Your physician has recommended you make the following change in your medication: STOP COUMADIN START  XARELTO   20 MG   1 TAB EVERY DAY  START Wednesday  09-19-11

## 2011-09-17 NOTE — Assessment & Plan Note (Signed)
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  F/U echo for symptoms

## 2011-09-18 NOTE — Progress Notes (Signed)
Addended by: Kem Parkinson on: 09/18/2011 04:52 PM   Modules accepted: Orders

## 2011-10-15 ENCOUNTER — Telehealth: Payer: Self-pay | Admitting: *Deleted

## 2011-10-15 NOTE — Telephone Encounter (Signed)
PT AWARE WILL MAIL [employer]  TO PT AS PT REQUESTED  TODAY ./CY

## 2011-10-22 ENCOUNTER — Ambulatory Visit (INDEPENDENT_AMBULATORY_CARE_PROVIDER_SITE_OTHER): Payer: Medicare Other | Admitting: Cardiovascular Disease

## 2011-10-22 ENCOUNTER — Encounter: Payer: Self-pay | Admitting: Cardiovascular Disease

## 2011-10-22 DIAGNOSIS — I10 Essential (primary) hypertension: Secondary | ICD-10-CM

## 2011-10-22 DIAGNOSIS — E119 Type 2 diabetes mellitus without complications: Secondary | ICD-10-CM

## 2011-10-22 DIAGNOSIS — E7800 Pure hypercholesterolemia, unspecified: Secondary | ICD-10-CM

## 2011-10-22 DIAGNOSIS — I4891 Unspecified atrial fibrillation: Secondary | ICD-10-CM

## 2011-10-22 MED ORDER — WARFARIN SODIUM 5 MG PO TABS: ORAL_TABLET | ORAL | Status: DC
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
## 2011-10-22 NOTE — Assessment & Plan Note (Signed)
Mild AS by echo 6/12  Consider F/U in 6 months

## 2011-10-22 NOTE — Patient Instructions (Addendum)
Your physician wants you to follow-up in: 3 MONTHS WITH DR Haywood Filler will receive a reminder letter in the mail two months in advance. If you don't receive a letter, please call our office to schedule the follow-up appointment. Your physician has recommended you make the following change in your medication: RESTART COUMADIN  10 MG TODAY THEN 5 MG DAILY  CHECK PT AT DR FRIED'S OFFICE ON Thursday  10-25-11 AT 11:00 AM

## 2011-10-22 NOTE — Assessment & Plan Note (Signed)
Cholesterol is at goal.  Continue current dose of statin and diet Rx.  No myalgias or side effects.  F/U  LFT's in 6 months. No results found for this basename: LDLCALC             

## 2011-10-22 NOTE — Assessment & Plan Note (Signed)
Well controlled.  Continue current medications and low sodium Dash type diet.    

## 2011-10-22 NOTE — Assessment & Plan Note (Signed)
Good rate control  Resume coumadin  Needs 3 day overlap of coumadin with xarelto

## 2011-10-22 NOTE — Assessment & Plan Note (Signed)
Poor control  Discussed diet  F/U primary

## 2012-02-06 ENCOUNTER — Ambulatory Visit (INDEPENDENT_AMBULATORY_CARE_PROVIDER_SITE_OTHER): Payer: Medicare Other | Admitting: Cardiovascular Disease

## 2012-02-06 ENCOUNTER — Encounter: Payer: Self-pay | Admitting: Cardiovascular Disease

## 2012-02-06 VITALS — BP 142/68 | HR 70 | Ht 62.0 in | Wt 173.0 lb

## 2012-02-06 DIAGNOSIS — R0609 Other forms of dyspnea: Secondary | ICD-10-CM

## 2012-02-06 DIAGNOSIS — I10 Essential (primary) hypertension: Secondary | ICD-10-CM

## 2012-02-06 DIAGNOSIS — R011 Cardiac murmur, unspecified: Secondary | ICD-10-CM

## 2012-02-06 DIAGNOSIS — E7800 Pure hypercholesterolemia, unspecified: Secondary | ICD-10-CM

## 2012-02-06 DIAGNOSIS — E119 Type 2 diabetes mellitus without complications: Secondary | ICD-10-CM

## 2012-02-06 DIAGNOSIS — R609 Edema, unspecified: Secondary | ICD-10-CM

## 2012-02-06 DIAGNOSIS — I4891 Unspecified atrial fibrillation: Secondary | ICD-10-CM

## 2012-02-06 HISTORY — PX: TRANSESOPHAGEAL ECHOCARDIOGRAM: SHX273

## 2012-02-06 LAB — BASIC METABOLIC PANEL
BUN: 16 mg/dL (ref 6–23)
Chloride: 103 mEq/L (ref 96–112)
Potassium: 4.6 mEq/L (ref 3.5–5.1)
Sodium: 141 mEq/L (ref 135–145)

## 2012-02-06 LAB — CBC WITH DIFFERENTIAL/PLATELET
Basophils Relative: 0.4 % (ref 0.0–3.0)
Eosinophils Relative: 3.1 % (ref 0.0–5.0)
HCT: 35.3 % — ABNORMAL LOW (ref 36.0–46.0)
Hemoglobin: 11.3 g/dL — ABNORMAL LOW (ref 12.0–15.0)
Lymphs Abs: 1.2 10*3/uL (ref 0.7–4.0)
Monocytes Relative: 8.1 % (ref 3.0–12.0)
Neutro Abs: 5.3 10*3/uL (ref 1.4–7.7)
RBC: 3.76 Mil/uL — ABNORMAL LOW (ref 3.87–5.11)
RDW: 15.4 % — ABNORMAL HIGH (ref 11.5–14.6)
WBC: 7.3 10*3/uL (ref 4.5–10.5)

## 2012-02-06 LAB — HEPATIC FUNCTION PANEL
ALT: 16 U/L (ref 0–35)
AST: 22 U/L (ref 0–37)
Albumin: 3.7 g/dL (ref 3.5–5.2)
Alkaline Phosphatase: 41 U/L (ref 39–117)
Bilirubin, Direct: 0.1 mg/dL (ref 0.0–0.3)
Total Protein: 6.8 g/dL (ref 6.0–8.3)

## 2012-02-06 LAB — BRAIN NATRIURETIC PEPTIDE: Pro B Natriuretic peptide (BNP): 281 pg/mL — ABNORMAL HIGH (ref 0.0–100.0)

## 2012-02-06 NOTE — Assessment & Plan Note (Signed)
Cholesterol is at goal.  Continue current dose of statin and diet Rx.  No myalgias or side effects.  F/U  LFT's in 6 months. No results found for this basename: LDLCALC             

## 2012-02-06 NOTE — Assessment & Plan Note (Signed)
Diuretic recenlty stopped due to low BP  Checking labs for BNP, BUN and Cr to see where we stand.  Unless Actos stopped she will likely need to be back on diuretic

## 2012-02-06 NOTE — Assessment & Plan Note (Signed)
Given dyspnea will recheck echo but AS does not sound severe. Also check EF and MR

## 2012-02-06 NOTE — Assessment & Plan Note (Signed)
Check A1c.  Woiuld prefer for her not to be on Actos given edema, weight gain.  Will forward to Dr Foy Guadalajara.

## 2012-02-06 NOTE — Patient Instructions (Signed)
Your physician wants you to follow-up in:  6 MONTH WITH DR Haywood Filler will receive a reminder letter in the mail two months in advance. If you don't receive a letter, please call our office to schedule the follow-up appointment. Your physician recommends that you continue on your current medications as directed. Please refer to the Current Medication list given to you today.  Your physician recommends that you return for lab work in: TODAY  BMET BNP  CBC WITH DIFF A1C LIVER  TSH  DX 427.31  Your physician has requested that you have an echocardiogram. Echocardiography is a painless test that uses sound waves to create images of your heart. It provides your doctor with information about the size and shape of your heart and how well your heart's chambers and valves are working. This procedure takes approximately one hour. There are no restrictions for this procedure. DX 427.31

## 2012-02-06 NOTE — Progress Notes (Signed)
Destiny [name] is seen today for F/U chronic afib, She has HTN and lower extremity edema from varicosities. She had successful right knee replacement by Dr. Despina Hick in December 2010 . She continues to struggle with her work at [employer].She has a history of mild MR, EF normal and mild AS with last echo done 9/11 She has progressive  exertional dyspnea . She has been compliant wi th her meds. There is no history of SSSCP or CAD. Has postural dizzyness.  She is on Actos and diuretic recently stopped and she complains of more ankle edema  Tried Xarelto but cost too much Wants to go back on coumadin. Normally had venous INR done at [facility] office  Reviewed labs from [place]: LDL 62 A1c 7.2 Normal LFT;s done 12/12   ROS: Denies fever, malais, weight loss, blurry vision, decreased visual acuity, cough, sputum, SOB, hemoptysis, pleuritic pain, palpitaitons, heartburn, abdominal pain, melena, lower extremity edema, claudication, or rash.  All other systems reviewed and negative  General: Affect appropriate Chronically ill female with red hair HEENT: normal Neck supple with no adenopathy JVP normal no bruits no thyromegaly Lungs clear with no wheezing and good diaphragmatic motion Heart:  S1/S2 midl to moderate AS  murmur, no rub, gallop or click PMI normal Abdomen: benighn, BS positve, no tenderness, no AAA no bruit.  No HSM or HJR Distal pulses intact with no bruits Plus one bilateral edema with varicose veins Neuro non-focal Skin warm and dry No muscular weakness   Current Outpatient Prescriptions  Medication Sig Dispense Refill  . atorvastatin (LIPITOR) 10 MG tablet Take 10 mg by mouth daily.        . Calcium Carbonate-Vit D-Min 600-400 MG-UNIT TABS Take by mouth daily.        . carvedilol (COREG) 25 MG tablet Take 25 mg by mouth 2 (two) times daily.        . Cinnamon 500 MG capsule Take 500 mg by mouth daily.        . cyanocobalamin 500 MCG tablet Take 500 mcg by mouth daily.        . digoxin  (LANOXIN) 0.25 MG tablet Take 250 mcg by mouth daily.        Marland Kitchen DILTIAZEM HCL ER BEADS PO Take by mouth daily.        . dorzolamide-timolol (COSOPT) 22.3-6.8 MG/ML ophthalmic solution 1 drop 2 (two) times daily.        Marland Kitchen glipiZIDE (GLUCOTROL) 10 MG 24 hr tablet Take 10 mg by mouth daily.        . insulin glargine (LANTUS) 100 UNIT/ML injection Inject into the skin daily. 32 units       . latanoprost (XALATAN) 0.005 % ophthalmic solution 1 drop daily.        Marland Kitchen lisinopril (PRINIVIL,ZESTRIL) 40 MG tablet Take 40 mg by mouth daily.        . Magnesium Oxide 250 MG TABS Take by mouth daily.        . metFORMIN (GLUMETZA) 500 MG (MOD) 24 hr tablet 4 tabs by  daily      . Multiple Vitamins-Minerals (MULTIVITAL) tablet Take 1 tablet by mouth daily.        . Omega-3 Fatty Acids (FISH OIL) 1000 MG CAPS Take by mouth daily.        . pioglitazone (ACTOS) 30 MG tablet Take 30 mg by mouth daily.        . potassium chloride SA (K-DUR,KLOR-CON) 20 MEQ tablet Take 20 mEq by mouth  2 (two) times daily.        . vitamin E 400 UNIT capsule Take 400 Units by mouth daily.        Marland Kitchen warfarin (COUMADIN) 5 MG tablet AS DIRECTED  90 tablet  3    Allergies  Review of patient's allergies indicates no known allergies.  Electrocardiogram:  Assessment and Plan

## 2012-02-06 NOTE — Assessment & Plan Note (Signed)
STable to my eyes  From venous disease.  D/C Actos if possible.  Low sodium diet.  Will see what labs look like regarding restarting diuretic

## 2012-02-06 NOTE — Assessment & Plan Note (Signed)
Good rate control and anticoagulation.  INR with Dr Foy Guadalajara

## 2012-02-14 ENCOUNTER — Other Ambulatory Visit: Payer: Self-pay

## 2012-02-14 ENCOUNTER — Ambulatory Visit (HOSPITAL_COMMUNITY): Payer: Medicare Other | Attending: Cardiology

## 2012-02-14 DIAGNOSIS — I4891 Unspecified atrial fibrillation: Secondary | ICD-10-CM | POA: Insufficient documentation

## 2012-02-14 DIAGNOSIS — R0602 Shortness of breath: Secondary | ICD-10-CM | POA: Insufficient documentation

## 2012-02-14 DIAGNOSIS — R609 Edema, unspecified: Secondary | ICD-10-CM

## 2012-02-14 DIAGNOSIS — E785 Hyperlipidemia, unspecified: Secondary | ICD-10-CM | POA: Insufficient documentation

## 2012-03-06 ENCOUNTER — Telehealth: Payer: Self-pay | Admitting: *Deleted

## 2012-03-06 MED ORDER — HYDROCHLOROTHIAZIDE 12.5 MG PO CAPS: 12.5000 mg | ORAL_CAPSULE | Freq: Every day | ORAL | Status: DC | PRN

## 2012-03-06 NOTE — Telephone Encounter (Signed)
Just use HCTZ as needed

## 2012-03-06 NOTE — Telephone Encounter (Signed)
Spoke with pt, aware of dr nishan's recommendations. 

## 2012-03-06 NOTE — Telephone Encounter (Signed)
Spoke with pt, she was recently changed from actos to tradjanta 5 mg daily. She is breathing better, she has no edema. She does not track her bp. She questioned if she needed to restart the HCTZ. Will forward for dr Eden Emms review.

## 2012-03-19 ENCOUNTER — Encounter: Payer: Self-pay | Admitting: Cardiovascular Disease

## 2012-10-27 ENCOUNTER — Other Ambulatory Visit: Payer: Self-pay | Admitting: Urology

## 2012-11-20 ENCOUNTER — Encounter (HOSPITAL_BASED_OUTPATIENT_CLINIC_OR_DEPARTMENT_OTHER): Payer: Self-pay | Admitting: *Deleted

## 2012-11-20 NOTE — Progress Notes (Signed)
To [facility] at 0715-Istat,Ekg on arrival,Npo after Mn-instructed to take diltiazem,lanoxin,coreg klor-con with small amt water that am.

## 2012-11-26 ENCOUNTER — Ambulatory Visit (HOSPITAL_BASED_OUTPATIENT_CLINIC_OR_DEPARTMENT_OTHER): Payer: Medicare Other | Admitting: Certified Registered"

## 2012-11-26 ENCOUNTER — Encounter (HOSPITAL_BASED_OUTPATIENT_CLINIC_OR_DEPARTMENT_OTHER): Payer: Self-pay | Admitting: Certified Registered"

## 2012-11-26 ENCOUNTER — Encounter (HOSPITAL_BASED_OUTPATIENT_CLINIC_OR_DEPARTMENT_OTHER): Payer: Self-pay | Admitting: *Deleted

## 2012-11-26 ENCOUNTER — Ambulatory Visit (HOSPITAL_BASED_OUTPATIENT_CLINIC_OR_DEPARTMENT_OTHER)
Admission: RE | Admit: 2012-11-26 | Discharge: 2012-11-26 | Disposition: A | Discharge status: Home / Self Care | Payer: Medicare Other | Source: Ambulatory Visit | Attending: Urology | Admitting: Urology

## 2012-11-26 ENCOUNTER — Encounter (HOSPITAL_BASED_OUTPATIENT_CLINIC_OR_DEPARTMENT_OTHER)
Admission: RE | Disposition: A | Discharge status: Home / Self Care | Payer: Self-pay | Source: Ambulatory Visit | Attending: Urology

## 2012-11-26 ENCOUNTER — Other Ambulatory Visit: Payer: Self-pay

## 2012-11-26 DIAGNOSIS — E7800 Pure hypercholesterolemia, unspecified: Secondary | ICD-10-CM | POA: Insufficient documentation

## 2012-11-26 DIAGNOSIS — I4891 Unspecified atrial fibrillation: Secondary | ICD-10-CM | POA: Insufficient documentation

## 2012-11-26 DIAGNOSIS — Z7901 Long term (current) use of anticoagulants: Secondary | ICD-10-CM | POA: Insufficient documentation

## 2012-11-26 DIAGNOSIS — Z794 Long term (current) use of insulin: Secondary | ICD-10-CM | POA: Insufficient documentation

## 2012-11-26 DIAGNOSIS — Z87891 Personal history of nicotine dependence: Secondary | ICD-10-CM | POA: Insufficient documentation

## 2012-11-26 DIAGNOSIS — N201 Calculus of ureter: Secondary | ICD-10-CM | POA: Insufficient documentation

## 2012-11-26 DIAGNOSIS — N1330 Unspecified hydronephrosis: Secondary | ICD-10-CM | POA: Insufficient documentation

## 2012-11-26 DIAGNOSIS — E119 Type 2 diabetes mellitus without complications: Secondary | ICD-10-CM | POA: Insufficient documentation

## 2012-11-26 DIAGNOSIS — I509 Heart failure, unspecified: Secondary | ICD-10-CM | POA: Insufficient documentation

## 2012-11-26 DIAGNOSIS — M810 Age-related osteoporosis without current pathological fracture: Secondary | ICD-10-CM | POA: Insufficient documentation

## 2012-11-26 DIAGNOSIS — I10 Essential (primary) hypertension: Secondary | ICD-10-CM | POA: Insufficient documentation

## 2012-11-26 HISTORY — PX: HOLMIUM LASER APPLICATION: SHX5852

## 2012-11-26 HISTORY — PX: CYSTOSCOPY W/ RETROGRADES: SHX1426

## 2012-11-26 HISTORY — DX: Other complications of anesthesia, initial encounter: T88.59XA

## 2012-11-26 HISTORY — DX: Adverse effect of unspecified anesthetic, initial encounter: T41.45XA

## 2012-11-26 LAB — POCT I-STAT 4, (NA,K, GLUC, HGB,HCT)
Glucose, Bld: 175 mg/dL — ABNORMAL HIGH (ref 70–99)
Potassium: 4.3 mEq/L (ref 3.5–5.1)
Sodium: 142 mEq/L (ref 135–145)

## 2012-11-26 LAB — GLUCOSE, CAPILLARY: Glucose-Capillary: 201 mg/dL — ABNORMAL HIGH (ref 70–99)

## 2012-11-26 SURGERY — CYSTOURETEROSCOPY, WITH STENT INSERTION
Anesthesia: General | Site: Ureter | Laterality: Right | Wound class: Clean Contaminated

## 2012-11-26 MED ORDER — CEPHALEXIN 250 MG PO CAPS: 250.0000 mg | ORAL_CAPSULE | Freq: Two times a day (BID) | ORAL | Status: DC

## 2012-11-26 MED ORDER — WARFARIN SODIUM 5 MG PO TABS: ORAL_TABLET | ORAL | Status: DC

## 2012-11-26 MED ORDER — SENNA-DOCUSATE SODIUM 8.6-50 MG PO TABS: 1.0000 | ORAL_TABLET | Freq: Two times a day (BID) | ORAL | Status: DC

## 2012-11-26 MED ORDER — FENTANYL CITRATE 0.05 MG/ML IJ SOLN
INTRAMUSCULAR | Status: DC | PRN
  Administered 2012-11-26: 25 ug via INTRAVENOUS
  Administered 2012-11-26: 50 ug via INTRAVENOUS

## 2012-11-26 MED ORDER — FENTANYL CITRATE 0.05 MG/ML IJ SOLN
25.0000 ug | INTRAMUSCULAR | Status: DC | PRN
  Filled 2012-11-26: qty 1

## 2012-11-26 MED ORDER — LACTATED RINGERS IV SOLN
INTRAVENOUS | Status: DC
  Administered 2012-11-26: 08:00:00 via INTRAVENOUS
  Filled 2012-11-26: qty 1000

## 2012-11-26 MED ORDER — IOHEXOL 350 MG/ML SOLN
INTRAVENOUS | Status: DC | PRN
  Administered 2012-11-26: 18 mL

## 2012-11-26 MED ORDER — MEPERIDINE HCL 25 MG/ML IJ SOLN
6.2500 mg | INTRAMUSCULAR | Status: DC | PRN
  Filled 2012-11-26: qty 1

## 2012-11-26 MED ORDER — DEXAMETHASONE SODIUM PHOSPHATE 4 MG/ML IJ SOLN
INTRAMUSCULAR | Status: DC | PRN
  Administered 2012-11-26: 8 mg via INTRAVENOUS

## 2012-11-26 MED ORDER — LIDOCAINE HCL (CARDIAC) 20 MG/ML IV SOLN
INTRAVENOUS | Status: DC | PRN
  Administered 2012-11-26: 50 mg via INTRAVENOUS

## 2012-11-26 MED ORDER — SODIUM CHLORIDE 0.9 % IR SOLN
Status: DC | PRN
  Administered 2012-11-26: 6000 mL

## 2012-11-26 MED ORDER — ONDANSETRON HCL 4 MG/2ML IJ SOLN
INTRAMUSCULAR | Status: DC | PRN
  Administered 2012-11-26: 4 mg via INTRAVENOUS

## 2012-11-26 MED ORDER — LACTATED RINGERS IV SOLN
INTRAVENOUS | Status: DC
  Filled 2012-11-26: qty 1000

## 2012-11-26 MED ORDER — PROMETHAZINE HCL 25 MG/ML IJ SOLN
6.2500 mg | INTRAMUSCULAR | Status: DC | PRN
  Filled 2012-11-26: qty 1

## 2012-11-26 MED ORDER — PROPOFOL 10 MG/ML IV BOLUS
INTRAVENOUS | Status: DC | PRN
  Administered 2012-11-26: 140 mg via INTRAVENOUS

## 2012-11-26 MED ORDER — EPHEDRINE SULFATE 50 MG/ML IJ SOLN
INTRAMUSCULAR | Status: DC | PRN
  Administered 2012-11-26: 10 mg via INTRAVENOUS

## 2012-11-26 MED ORDER — CEFAZOLIN SODIUM 1-5 GM-% IV SOLN
1.0000 g | INTRAVENOUS | Status: DC
  Filled 2012-11-26: qty 50

## 2012-11-26 MED ORDER — OXYCODONE-ACETAMINOPHEN 5-325 MG PO TABS: 1.0000 | ORAL_TABLET | ORAL | Status: DC | PRN

## 2012-11-26 MED ORDER — CEFAZOLIN SODIUM-DEXTROSE 2-3 GM-% IV SOLR
2.0000 g | INTRAVENOUS | Status: DC
  Filled 2012-11-26: qty 50

## 2012-11-26 SURGICAL SUPPLY — 50 items
ADAPTER CATH URET PLST 4-6FR (CATHETERS) ×2 IMPLANT
ADPR CATH URET STRL DISP 4-6FR (CATHETERS) ×1
BAG DRAIN URO-CYSTO SKYTR STRL (DRAIN) ×2 IMPLANT
BAG DRN UROCATH (DRAIN) ×1
BAG URO CATCHER STRL LF (DRAPE) ×2 IMPLANT
BASKET LASER NITINOL 1.9FR (BASKET) ×2 IMPLANT
BASKET STNLS GEMINI 4WIRE 3FR (BASKET) IMPLANT
BASKET ZERO TIP NITINOL 2.4FR (BASKET) IMPLANT
BRUSH URET BIOPSY 3F (UROLOGICAL SUPPLIES) IMPLANT
BSKT STON RTRVL 120 1.9FR (BASKET) ×1
BSKT STON RTRVL GEM 120X11 3FR (BASKET)
BSKT STON RTRVL ZERO TP 2.4FR (BASKET)
CANISTER SUCT LVC 12 LTR MEDI- (MISCELLANEOUS) ×2 IMPLANT
CATH FOLEY 2WAY  3CC  8FR (CATHETERS)
CATH FOLEY 2WAY 3CC 8FR (CATHETERS) IMPLANT
CATH INTERMIT  6FR 70CM (CATHETERS) ×2 IMPLANT
CATH URET 5FR 28IN CONE TIP (BALLOONS)
CATH URET 5FR 28IN OPEN ENDED (CATHETERS) IMPLANT
CATH URET 5FR 70CM CONE TIP (BALLOONS) IMPLANT
CLOTH BEACON ORANGE TIMEOUT ST (SAFETY) ×2 IMPLANT
DRAPE CAMERA CLOSED 9X96 (DRAPES) ×2 IMPLANT
ELECT REM PT RETURN 9FT ADLT (ELECTROSURGICAL)
ELECTRODE REM PT RTRN 9FT ADLT (ELECTROSURGICAL) IMPLANT
FIBER LASER TRAC TIP (UROLOGICAL SUPPLIES) IMPLANT
GLOVE BIO SURGEON STRL SZ7 (GLOVE) ×2 IMPLANT
GLOVE BIO SURGEON STRL SZ7.5 (GLOVE) ×2 IMPLANT
GLOVE BIOGEL PI IND STRL 7.0 (GLOVE) ×1 IMPLANT
GLOVE BIOGEL PI INDICATOR 7.0 (GLOVE) ×1
GLOVE ECLIPSE 6.5 STRL STRAW (GLOVE) ×2 IMPLANT
GOWN PREVENTION PLUS LG XLONG (DISPOSABLE) IMPLANT
GOWN PREVENTION PLUS XLARGE (GOWN DISPOSABLE) ×2 IMPLANT
GOWN STRL NON-REIN LRG LVL3 (GOWN DISPOSABLE) ×2 IMPLANT
GOWN STRL REIN XL XLG (GOWN DISPOSABLE) ×2 IMPLANT
GUIDEWIRE 0.038 PTFE COATED (WIRE) IMPLANT
GUIDEWIRE ANG ZIPWIRE 038X150 (WIRE) ×2 IMPLANT
GUIDEWIRE STR DUAL SENSOR (WIRE) ×2 IMPLANT
IV NS IRRIG 3000ML ARTHROMATIC (IV SOLUTION) ×4 IMPLANT
KIT BALLIN UROMAX 15FX10 (LABEL) IMPLANT
KIT BALLN UROMAX 15FX4 (MISCELLANEOUS) IMPLANT
KIT BALLN UROMAX 26 75X4 (MISCELLANEOUS)
LASER FIBER DISP (UROLOGICAL SUPPLIES) IMPLANT
PACK CYSTOSCOPY (CUSTOM PROCEDURE TRAY) ×2 IMPLANT
SET HIGH PRES BAL DIL (LABEL)
SHEATH ACCESS URETERAL 24CM (SHEATH) ×2 IMPLANT
SHEATH URET ACCESS 12FR/35CM (UROLOGICAL SUPPLIES) IMPLANT
SHEATH URET ACCESS 12FR/55CM (UROLOGICAL SUPPLIES) IMPLANT
STENT URET 6FRX24 CONTOUR (STENTS) ×2 IMPLANT
SYRINGE 10CC LL (SYRINGE) ×2 IMPLANT
SYRINGE IRR TOOMEY STRL 70CC (SYRINGE) IMPLANT
TUBE FEEDING 8FR 16IN STR KANG (MISCELLANEOUS) ×2 IMPLANT

## 2012-11-26 NOTE — Brief Op Note (Signed)
11/26/2012  9:23 AM  PATIENT:  Destiny [name]  78 y.o. female  PRE-OPERATIVE DIAGNOSIS:  RIGHT URETERAL STONE, HYDRONEPHOSIS  POST-OPERATIVE DIAGNOSIS:  RIGHT URETERAL STONE, HYDRONEPHOSIS  PROCEDURE:  Procedure(s): CYSTOSCOPY WITH URETEROSCOPY AND STENT PLACEMENT (Right) HOLMIUM LASER APPLICATION (Right) CYSTOSCOPY WITH RETROGRADE PYELOGRAM (Right)  SURGEON:  Surgeon(s) and Role:    * Sebastian Ache, MD - Primary  PHYSICIAN ASSISTANT:   ASSISTANTS: none   ANESTHESIA:   general  EBL:  Total I/O In: 200 [I.V.:200] Out: -   BLOOD ADMINISTERED:none  DRAINS: none   LOCAL MEDICATIONS USED:  NONE  SPECIMEN:  Source of Specimen:  Rt ureteral stone  DISPOSITION OF SPECIMEN:  [facility] for Compositional Analysis  COUNTS:  YES  TOURNIQUET:  * No tourniquets in log *  DICTATION: .Other Dictation: Dictation Number [id number]  PLAN OF CARE: Discharge to home after PACU  PATIENT DISPOSITION:  PACU - hemodynamically stable.   Delay start of Pharmacological VTE agent (>24hrs) due to surgical blood loss or risk of bleeding: yes

## 2012-11-26 NOTE — Anesthesia Preprocedure Evaluation (Addendum)
Anesthesia Evaluation  Patient identified by MRN, date of birth, ID band Patient awake    Reviewed: Allergy & Precautions, H&P , NPO status , Patient's Chart, lab work & pertinent test results  History of Anesthesia Complications (+) PROLONGED EMERGENCE  Airway Mallampati: II TM Distance: >3 FB Neck ROM: Full    Dental no notable dental hx. (+) Edentulous Upper and Edentulous Lower   Pulmonary neg pulmonary ROS,  breath sounds clear to auscultation  Pulmonary exam normal       Cardiovascular hypertension, Pt. on medications negative cardio ROS  + dysrhythmias Atrial Fibrillation + Valvular Problems/Murmurs (mild AS by Echo 2013. No MR. EF 60%) AS Rhythm:Regular Rate:Normal     Neuro/Psych negative neurological ROS  negative psych ROS   GI/Hepatic negative GI ROS, Neg liver ROS,   Endo/Other  negative endocrine ROSdiabetes, Insulin Dependent and Oral Hypoglycemic Agents  Renal/GU negative Renal ROS  negative genitourinary   Musculoskeletal negative musculoskeletal ROS (+)   Abdominal   Peds negative pediatric ROS (+)  Hematology negative hematology ROS (+)   Anesthesia Other Findings   Reproductive/Obstetrics negative OB ROS                          Anesthesia Physical Anesthesia Plan  ASA: III  Anesthesia Plan: General   Post-op Pain Management:    Induction: Intravenous  Airway Management Planned: LMA  Additional Equipment:   Intra-op Plan:   Post-operative Plan: Extubation in OR  Informed Consent: I have reviewed the patients History and Physical, chart, labs and discussed the procedure including the risks, benefits and alternatives for the proposed anesthesia with the patient or authorized representative who has indicated his/her understanding and acceptance.   Dental advisory given  Plan Discussed with: CRNA  Anesthesia Plan Comments:         Anesthesia Quick  Evaluation

## 2012-11-26 NOTE — Anesthesia Procedure Notes (Signed)
Procedure Name: LMA Insertion Date/Time: 11/26/2012 8:35 AM Performed by: Renella Cunas D Pre-anesthesia Checklist: Patient identified, Emergency Drugs available, Suction available and Patient being monitored Patient Re-evaluated:Patient Re-evaluated prior to inductionOxygen Delivery Method: Circle System Utilized Preoxygenation: Pre-oxygenation with 100% oxygen Intubation Type: IV induction Ventilation: Mask ventilation without difficulty LMA: LMA inserted LMA Size: 4.0 Number of attempts: 1 Airway Equipment and Method: bite block Placement Confirmation: positive ETCO2 Tube secured with: Tape Dental Injury: Teeth and Oropharynx as per pre-operative assessment

## 2012-11-26 NOTE — Transfer of Care (Signed)
Immediate Anesthesia Transfer of Care Note  Patient: Destiny [name]  Procedure(s) Performed: Procedure(s) (LRB): CYSTOSCOPY WITH URETEROSCOPY AND STENT PLACEMENT (Right) HOLMIUM LASER APPLICATION (Right) CYSTOSCOPY WITH RETROGRADE PYELOGRAM (Right)  Patient Location: PACU  Anesthesia Type: General  Level of Consciousness: awake, oriented, sedated and patient cooperative  Airway & Oxygen Therapy: Patient Spontanous Breathing and Patient connected to face mask oxygen  Post-op Assessment: Report given to PACU RN and Post -op Vital signs reviewed and stable  Post vital signs: Reviewed and stable  Complications: No apparent anesthesia complications

## 2012-11-26 NOTE — Anesthesia Postprocedure Evaluation (Signed)
  Anesthesia Post-op Note  Patient: Destiny [name]  Procedure(s) Performed: Procedure(s) (LRB): CYSTOSCOPY WITH URETEROSCOPY AND STENT PLACEMENT (Right) HOLMIUM LASER APPLICATION (Right) CYSTOSCOPY WITH RETROGRADE PYELOGRAM (Right)  Patient Location: PACU  Anesthesia Type: General  Level of Consciousness: awake and alert   Airway and Oxygen Therapy: Patient Spontanous Breathing  Post-op Pain: mild  Post-op Assessment: Post-op Vital signs reviewed, Patient's Cardiovascular Status Stable, Respiratory Function Stable, Patent Airway and No signs of Nausea or vomiting  Last Vitals:  Filed Vitals:   11/26/12 1015  BP:   Pulse: 62  Temp:   Resp: 15    Post-op Vital Signs: stable   Complications: No apparent anesthesia complications

## 2012-11-26 NOTE — H&P (Signed)
Destiny [name] is an 78 y.o. female.    Chief Complaint: Pre-Op Rt Ureteroscopic Stone Manipulation  HPI:   1 - Microhematuria - Pt with microblood noted by [name] on several occasions. She is on coumadin for A-Fib. She has remote >20PY smoking history. No dye / chemical exposure. No gross episodes. CT Urogram 10/2012 with Rt > Lt nephrolithaisis, no enhancing masses. Cysto pending for time os stone surgery..  2 - Rt Flank Pain / Ureteral Stones - Multifocal Rt ureteral (6mm upper, 6mm mid, severe hydro) and left lower pole 6mm stones found on w/u.  No piror stones. No fevers. She  PMH sig for AFib on Coumadin (no CVA, managed by Charlton Haws of [facility]) , IDDM2, CHF, knee replacement. 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  Today [name] is seen to proceed with rt ureteroscopic stone manipulation to address her proximal rt ureteral obstructing stones as well as cysto to complete he hematuria evaluation.  Most recent UA non-worrisome for infectious parameters.  Past Medical History  Diagnosis Date  . Mitral valve insufficiency and aortic valve insufficiency   . Unspecified essential hypertension   . Mononeuritis of unspecified site   . Edema   . Calculus of kidney   . Unspecified glaucoma(365.9)   . Pure hypercholesterolemia   . Osteoarthrosis, unspecified whether generalized or localized, unspecified site   . Atrial fibrillation 1999-diagnosed  . Diabetes mellitus without mention of complication     Past Surgical History  Procedure Laterality Date  . Catarct surgery Left   . Joint replacement Right 2009    knee  . Tonsillectomy  1943  . Appendectomy  1954  . Abdominal hysterectomy  2000    partial  . Transesophageal echocardiogram  02/2012    History reviewed. No pertinent family history. Social History:  reports that she quit smoking about 24 years ago. She has never used smokeless tobacco. She reports that  drinks  alcohol. Her drug history is not on file.  Allergies: No Known Allergies  No prescriptions prior to admission    No results found for this or any previous visit (from the past 48 hour(s)). No results found.  Review of Systems  Constitutional: Negative.  Negative for fever and chills.  HENT: Negative.   Eyes: Negative.   Respiratory: Negative.   Cardiovascular: Negative.   Gastrointestinal: Negative.   Genitourinary: Positive for hematuria and flank pain.  Musculoskeletal: Negative.   Skin: Negative.   Neurological: Negative.   Endo/Heme/Allergies: Negative.   Psychiatric/Behavioral: Negative.     Height 5\' 2"  (1.575 m), weight 71.668 kg (158 lb). Physical Exam  Constitutional: She is oriented to person, place, and time. She appears well-developed and well-nourished.  HENT:  Head: Normocephalic.  Eyes: EOM are normal. Pupils are equal, round, and reactive to light.  Neck: Normal range of motion. Neck supple.  Cardiovascular: Normal rate.   Respiratory: Effort normal.  GI: Soft. Bowel sounds are normal.  Genitourinary:  Mild Rt CVAT  Musculoskeletal: Normal range of motion.  Neurological: She is alert and oriented to person, place, and time.  Skin: Skin is warm and dry.  Psychiatric: She has a normal mood and affect. Her behavior is normal. Judgment and thought content normal.     Assessment/Plan  1 - Hematuria - Eval with exam, cysto, and imaging fortunately only reveals nerphrolithiasis as likley cause. Will manage as per below.  2 - Rt Flank Pain / Ureteral  Stones - We re-discussed ureteroscopic stone manipulation with basketing and laser-lithotripsy in detail.  We discussed risks including bleeding, infection, damage to kidney / ureter  bladder, rarely loss of kidney. We discussed anesthetic risks and rare but serious surgical complications including DVT, PE, MI, and mortality. We specifically addressed that in 5-10% of cases a staged approach is required with  stenting followed by re-attempt ureteroscopy if anatomy unfavorable. The patient voiced understanding and wises to proceed.   Will need paralysis / ET tube if pt unable to come off anticoagulation.  Floy Angert 11/26/2012, 6:01 AM

## 2012-11-26 NOTE — Op Note (Signed)
Destiny [name], [name] NO.:  [id number]  MEDICAL RECORD NO.:  [id number]  LOCATION:                                 FACILITY:  PHYSICIAN:  Sebastian Ache, MD     DATE OF BIRTH:  1935/09/04  DATE OF PROCEDURE:  11/26/2012 DATE OF DISCHARGE:                              OPERATIVE REPORT   PREOPERATIVE DIAGNOSES:  Right proximal ureteral stone, hydronephrosis, flank pain, hematuria.  PROCEDURES: 1. Cystoscopy with right retrograde pyelogram interpretation. 2. Right ureteroscopy with laser lithotripsy. 3. Placement of right ureteral stent 6 x 24 with tether to right     thigh.  FINDINGS: 1. Moderate to severe right hydronephrosis to the level of the UPJ     where 2 small ureteral stones were seen.  SPECIMEN:  Right ureteral stone for composition analysis.  ESTIMATED BLOOD LOSS:  Nil.  COMPLICATIONS:  None.  INDICATION:  Destiny [name] is a very pleasant 78 year old lady with recent history of flank pain and hematuria.  She was found on evaluation to have 2 small right ureteral stones and moderate-severe right hydronephrosis. Overall, her renal function was normal.  She has history of AFib on Coumadin.  She had cardiac clearance with holding her Coumadin for 5 days prior in preparation for definitive management of this with right ureteroscopic stone manipulation.  Informed consent obtained and placed in medical record.  PROCEDURE IN DETAIL:  The patient being Destiny [name] was verified and the procedure being right ureteroscopic stone manipulation was confirmed.  The procedure was carried out.  Time-out was performed. Intravenous antibiotics administered.  General LMA anesthesia was introduced.  The patient was placed into a low lithotomy position. Sterile field was created by prepping and draping the patient's vagina, introitus, and proximal thighs using iodine x3.  Next, cystourethroscopy was performed using a 22-French rigid cystoscope with 12-degree  offset lens.  Inspection of the bladder revealed no diverticula, calcifications, papular lesions.  There was a moderate cystocele not past the introitus resulted in some angulation of the ureters.  The right ureteral orifice was gently cannulated using a 6-French end-hole catheter and right retrograde pyelogram was obtained.  Right retrograde pyelogram demonstrated single right ureter, single system right kidney.  There was a filling defect in the mid ureter consistent with a possible stone.  There was also filling defect in the proximal ureter consistent with possible stone as well as moderate to severe hydronephrosis above this.  A 0.038 Glidewire was advanced to the level of upper pole and set aside as a safety wire.  Next, semi-rigid ureteroscopy was performed in the entire length of the right ureter alongside a separate Sensor working wire.  In the mid ureter, a single calcification was encountered.  This appeared to be amenabe to simple basketing.  As such, an escape type basket was used to grasp and this removed in its entirety.  Inspection of the remainder of the ureter revealed no additional calcifications.  As there was suspicion of stone multifocality, it was felt that inspection the renal pelvis was warranted.  As such, a 12/14 ureteral access sheath was placed at the level of the proximal ureter over the Sensor working wire using continuous fluoroscopic guidance.  Next, a flexible ureteroscopy was performed of the proximal ureter and systematic inspection of all calices was performed on the right side using 8-French digital ureteroscope.  Indeed, a separate calcification was noted.  This appeared approximately 6-7 mm and too large for simple basketing.  As such, holmium laser energy was applied using a 200 nanometer fiber at settings of 0.5 joules and 5 hertz.  This was fragmented into 3 smaller pieces.  Each of these were grasped with an escape-type basket and brought out  in their entirety.  Repeat inspection of the renal pelvis revealed no residual stone fragments.  The sheath was removed under continuous ureteroscopic laser and no mucosal abnormalities were found. Notably, all ureteroscopic portions of the procedure were performed with a 8-French feeding tube in the urinary bladder for pressure release. Finally, a new 6 x 24 double-J stent was placed at the level of the kidney and urinary bladder using continuous fluoroscopic vision.  Tether was fashioned in the patient's right thigh.  The procedure was then terminated.  The patient tolerated the procedure well.  There were no immediate periprocedural complications.  The patient was taken to postanesthesia care unit in stable condition.          ______________________________ Sebastian Ache, MD     TM/MEDQ  D:  11/26/2012  T:  11/26/2012  Job:  [id number]

## 2012-11-27 ENCOUNTER — Encounter (HOSPITAL_BASED_OUTPATIENT_CLINIC_OR_DEPARTMENT_OTHER): Payer: Self-pay | Admitting: Urology

## 2013-01-14 ENCOUNTER — Ambulatory Visit: Payer: Medicare Other | Admitting: Cardiovascular Disease

## 2013-04-27 ENCOUNTER — Encounter: Payer: Self-pay | Admitting: Cardiovascular Disease

## 2013-04-27 ENCOUNTER — Ambulatory Visit (INDEPENDENT_AMBULATORY_CARE_PROVIDER_SITE_OTHER): Payer: Medicare Other | Admitting: Cardiovascular Disease

## 2013-04-27 VITALS — BP 130/70 | HR 63 | Wt 161.0 lb

## 2013-04-27 DIAGNOSIS — R0989 Other specified symptoms and signs involving the circulatory and respiratory systems: Secondary | ICD-10-CM

## 2013-04-27 DIAGNOSIS — I4891 Unspecified atrial fibrillation: Secondary | ICD-10-CM

## 2013-04-27 DIAGNOSIS — I10 Essential (primary) hypertension: Secondary | ICD-10-CM

## 2013-04-27 DIAGNOSIS — I080 Rheumatic disorders of both mitral and aortic valves: Secondary | ICD-10-CM

## 2013-04-27 DIAGNOSIS — E7800 Pure hypercholesterolemia, unspecified: Secondary | ICD-10-CM

## 2013-04-27 DIAGNOSIS — E119 Type 2 diabetes mellitus without complications: Secondary | ICD-10-CM

## 2013-04-27 NOTE — Assessment & Plan Note (Signed)
Cholesterol is at goal.  Continue current dose of statin and diet Rx.  No myalgias or side effects.  F/U  LFT's in 6 months. No results found for this basename: [facility]   Labs done with Dr Foy Gu[place] see HPI

## 2013-04-27 NOTE — Patient Instructions (Addendum)
Your physician wants you to follow-up in:    6 MONTHS WITH DR  NISHAN  You will receive a reminder letter in the mail two months in advance. If you don't receive a letter, please call our office to schedule the follow-up appointment.  Your physician recommends that you continue on your current medications as directed. Please refer to the Current Medication list given to you today.   Your physician has requested that you have a carotid duplex. This test is an ultrasound of the carotid arteries in your neck. It looks at blood flow through these arteries that supply the brain with blood. Allow one hour for this exam. There are no restrictions or special instructions.   

## 2013-04-27 NOTE — Assessment & Plan Note (Signed)
Good rate control and anticoagulation ECG 2/14 stable Digoxan effect

## 2013-04-27 NOTE — Assessment & Plan Note (Signed)
No change in murmur consider f/u echo in a year

## 2013-04-27 NOTE — Assessment & Plan Note (Signed)
Well controlled.  Continue current medications and low sodium Dash type diet.    

## 2013-04-27 NOTE — Assessment & Plan Note (Signed)
Discussed low carb diet.  Target hemoglobin A1c is 6.5 or less.  Continue current medications.  

## 2013-04-27 NOTE — Progress Notes (Signed)
Patient ID: Destiny [name], female   DOB: [birth year], 78 y.o.   MRN: [id number] Destiny [name] is seen today for F/U chronic afib, She has HTN and lower extremity edema from varicosities. She had successful right knee replacement by Dr. Despina Hick in December 2010 . She continues to struggle with her work at [employer].She has a history of mild MR, EF normal and mild AS with last echo done 9/11 She has progressive exertional dyspnea . She has been compliant wi th her meds. There is no history of SSSCP or CAD. Has postural dizzyness. She is on Actos and diuretic recently stopped and she complains of more ankle edema  Tried Xarelto but cost too much Back on coumadin since 2013  Normally had venous INR done at [facility] office  Reviewed labs from [place]: LDL 62 A1c 7.2 Normal LFT;s done 12/13  Doesn't like her boss at [employer]  Recent surgery for kidney stones and doing better. Coumadin stopped with no lovenox bridge and did fine  ROS: Denies fever, malais, weight loss, blurry vision, decreased visual acuity, cough, sputum, SOB, hemoptysis, pleuritic pain, palpitaitons, heartburn, abdominal pain, melena, lower extremity edema, claudication, or rash.  All other systems reviewed and negative  General: Affect appropriate Healthy:  appears stated age HEENT: normal Neck supple with no adenopathy JVP normal right  bruits no thyromegaly Lungs clear with no wheezing and good diaphragmatic motion Heart:  S1/S2 SEM murmur, no rub, gallop or click PMI normal Abdomen: benighn, BS positve, no tenderness, no AAA no bruit.  No HSM or HJR Distal pulses intact with no bruits No edema Neuro non-focal Skin warm and dry No muscular weakness   Current Outpatient Prescriptions  Medication Sig Dispense Refill  . atorvastatin (LIPITOR) 10 MG tablet Take 10 mg by mouth daily.        . Calcium Carbonate-Vit D-Min 600-400 MG-UNIT TABS Take by mouth daily.        Marland Kitchen CARTIA XT 240 MG 24 hr capsule Take 1 capsule by  mouth daily.      . carvedilol (COREG) 25 MG tablet Take 25 mg by mouth 2 (two) times daily.        . Cinnamon 500 MG capsule Take 500 mg by mouth daily.        . cyanocobalamin 500 MCG tablet Take 500 mcg by mouth daily.        . digoxin (LANOXIN) 0.25 MG tablet Take 250 mcg by mouth daily.        . dorzolamide-timolol (COSOPT) 22.3-6.8 MG/ML ophthalmic solution 1 drop 2 (two) times daily.        Marland Kitchen glipiZIDE (GLUCOTROL) 10 MG 24 hr tablet Take 10 mg by mouth daily.        . hydrochlorothiazide (MICROZIDE) 12.5 MG capsule Take 12.5 mg by mouth daily.      . insulin glargine (LANTUS) 100 UNIT/ML injection Inject into the skin at bedtime. 32 units      . latanoprost (XALATAN) 0.005 % ophthalmic solution 1 drop daily.        Marland Kitchen linagliptin (TRADJENTA) 5 MG TABS tablet Take 5 mg by mouth daily.      Marland Kitchen lisinopril (PRINIVIL,ZESTRIL) 40 MG tablet Take 40 mg by mouth daily.        . Magnesium Oxide 250 MG TABS Take by mouth daily.        . metFORMIN (GLUMETZA) 500 MG (MOD) 24 hr tablet 4 tabs by  daily      . Multiple Vitamins-Minerals (  MULTIVITAL) tablet Take 1 tablet by mouth daily.        . Omega-3 Fatty Acids (FISH OIL) 1000 MG CAPS Take by mouth daily.        . potassium chloride SA (K-DUR,KLOR-CON) 20 MEQ tablet Take 20 mEq by mouth 2 (two) times daily.        . vitamin E 400 UNIT capsule Take 400 Units by mouth daily.        Marland Kitchen warfarin (COUMADIN) 5 MG tablet AS DIRECTED. Restart after ureteral stent removal.  90 tablet  3   No current facility-administered medications for this visit.    Allergies  Review of patient's allergies indicates no known allergies.  Electrocardiogram:  11/28/12 AFIB RATE 64 NONSPECIFIC ST/T WAVE CHANGES   Assessment and Plan

## 2013-05-11 ENCOUNTER — Encounter (INDEPENDENT_AMBULATORY_CARE_PROVIDER_SITE_OTHER): Payer: Medicare Other

## 2013-05-11 DIAGNOSIS — R0989 Other specified symptoms and signs involving the circulatory and respiratory systems: Secondary | ICD-10-CM

## 2013-09-18 ENCOUNTER — Telehealth: Payer: Self-pay | Admitting: *Deleted

## 2013-09-18 NOTE — Telephone Encounter (Signed)
Spoke with pt, questions answered.

## 2013-09-18 NOTE — Telephone Encounter (Signed)
Left message for pt to call, she had left me a voicemail to call her.

## 2013-09-18 NOTE — Telephone Encounter (Signed)
Patient returned call

## 2013-11-03 ENCOUNTER — Encounter: Payer: Self-pay | Admitting: Cardiovascular Disease

## 2013-11-03 ENCOUNTER — Ambulatory Visit (INDEPENDENT_AMBULATORY_CARE_PROVIDER_SITE_OTHER): Payer: Medicare HMO | Admitting: Cardiovascular Disease

## 2013-11-03 VITALS — BP 135/75 | HR 80 | Resp 11 | Ht 62.0 in | Wt 156.1 lb

## 2013-11-03 DIAGNOSIS — I10 Essential (primary) hypertension: Secondary | ICD-10-CM

## 2013-11-03 DIAGNOSIS — E119 Type 2 diabetes mellitus without complications: Secondary | ICD-10-CM

## 2013-11-03 DIAGNOSIS — I359 Nonrheumatic aortic valve disorder, unspecified: Secondary | ICD-10-CM

## 2013-11-03 DIAGNOSIS — I350 Nonrheumatic aortic (valve) stenosis: Secondary | ICD-10-CM

## 2013-11-03 DIAGNOSIS — R0989 Other specified symptoms and signs involving the circulatory and respiratory systems: Secondary | ICD-10-CM

## 2013-11-03 DIAGNOSIS — I4891 Unspecified atrial fibrillation: Secondary | ICD-10-CM

## 2013-11-03 DIAGNOSIS — R011 Cardiac murmur, unspecified: Secondary | ICD-10-CM

## 2013-11-03 NOTE — Assessment & Plan Note (Signed)
F/U echo for AS  Likely moderate

## 2013-11-03 NOTE — Assessment & Plan Note (Signed)
Good rate control and anticoagulation f/u coumadin clinic next week

## 2013-11-03 NOTE — Assessment & Plan Note (Signed)
Known 37% LICA  F/u duplex next visit

## 2013-11-03 NOTE — Assessment & Plan Note (Signed)
Well controlled.  Continue current medications and low sodium Dash type diet.    

## 2013-11-03 NOTE — Patient Instructions (Signed)
Your physician has requested that you have an echocardiogram. Echocardiography is a painless test that uses sound waves to create images of your heart. It provides your doctor with information about the size and shape of your heart and how well your heart's chambers and valves are working. This procedure takes approximately one hour. There are no restrictions for this procedure.  Your physician wants you to follow-up in: 6 months with Dr. Johnsie Cancel. You will receive a reminder letter in the mail two months in advance. If you don't receive a letter, please call our office to schedule the follow-up appointment.

## 2013-11-03 NOTE — Assessment & Plan Note (Signed)
Discussed low carb diet.  Target hemoglobin A1c is 6.5 or less.  Continue current medications.  

## 2013-11-03 NOTE — Progress Notes (Signed)
Patient ID: Destiny [name], female   DOB: 09-10-1935, 79 y.o.   MRN: [id number] Destiny [name] is seen today for F/U chronic afib, She has HTN and lower extremity edema from varicosities. She had successful right knee replacement by Dr. Maureen Ralphs in December 2010 . She continues to struggle with her work at [employer].She has a history of mild MR, EF normal and mild AS with last echo done 9/11 She has progressive exertional dyspnea . She has been compliant wi th her meds. There is no history of SSSCP or CAD. Has postural dizzyness. She is on Actos and diuretic recently stopped and she complains of more ankle edema  Tried Xarelto but cost too much Back on coumadin since 2013 Normally had venous INR done at [facility] office  Reviewed labs from [place]: LDL 62 A1c 7.2 Normal LFT;s done 12/13  Doesn't like her boss at [employer]  Recent surgery for kidney stones and doing better. Coumadin stopped with no lovenox bridge and did fine      ROS: Denies fever, malais, weight loss, blurry vision, decreased visual acuity, cough, sputum, SOB, hemoptysis, pleuritic pain, palpitaitons, heartburn, abdominal pain, melena, lower extremity edema, claudication, or rash.  All other systems reviewed and negative  General: Affect appropriate Healthy:  appears stated age 84: normal Neck supple with no adenopathy JVP normal right  bruits no thyromegaly Lungs clear with no wheezing and good diaphragmatic motion Heart:  S1/S2 AS murmur, no rub, gallop or click PMI normal Abdomen: benighn, BS positve, no tenderness, no AAA no bruit.  No HSM or HJR Distal pulses intact with no bruits No edema Neuro non-focal Skin warm and dry No muscular weakness   Current Outpatient Prescriptions  Medication Sig Dispense Refill  . ONE TOUCH ULTRA TEST test strip       . atorvastatin (LIPITOR) 10 MG tablet Take 10 mg by mouth daily.        . Calcium Carbonate-Vit D-Min 600-400 MG-UNIT TABS Take by mouth daily.        Marland Kitchen CARTIA  XT 240 MG 24 hr capsule Take 1 capsule by mouth daily.      . carvedilol (COREG) 25 MG tablet Take 25 mg by mouth 2 (two) times daily.        . Cinnamon 500 MG capsule Take 500 mg by mouth daily.        . cyanocobalamin 500 MCG tablet Take 500 mcg by mouth daily.        . digoxin (LANOXIN) 0.25 MG tablet Take 250 mcg by mouth daily.        . dorzolamide-timolol (COSOPT) 22.3-6.8 MG/ML ophthalmic solution 1 drop 2 (two) times daily.        Marland Kitchen glipiZIDE (GLUCOTROL) 10 MG 24 hr tablet Take 10 mg by mouth daily.        . hydrochlorothiazide (MICROZIDE) 12.5 MG capsule Take 12.5 mg by mouth daily.      . insulin glargine (LANTUS) 100 UNIT/ML injection Inject into the skin at bedtime. 32 units      . latanoprost (XALATAN) 0.005 % ophthalmic solution 1 drop daily.        Marland Kitchen linagliptin (TRADJENTA) 5 MG TABS tablet Take 5 mg by mouth daily.      Marland Kitchen lisinopril (PRINIVIL,ZESTRIL) 40 MG tablet Take 40 mg by mouth daily.        . Magnesium Oxide 250 MG TABS Take by mouth daily.        . metFORMIN (GLUMETZA) 500 MG (MOD)  24 hr tablet 4 tabs by  daily      . Multiple Vitamins-Minerals (MULTIVITAL) tablet Take 1 tablet by mouth daily.        . Omega-3 Fatty Acids (FISH OIL) 1000 MG CAPS Take by mouth daily.        . potassium chloride SA (K-DUR,KLOR-CON) 20 MEQ tablet Take 20 mEq by mouth 2 (two) times daily.        . vitamin E 400 UNIT capsule Take 400 Units by mouth daily.        Marland Kitchen warfarin (COUMADIN) 5 MG tablet AS DIRECTED. Restart after ureteral stent removal.  90 tablet  3   No current facility-administered medications for this visit.    Allergies  Review of patient's allergies indicates no known allergies.  Electrocardiogram:  afib nonspecific St/T wave changes   Assessment and Plan

## 2013-11-24 ENCOUNTER — Other Ambulatory Visit (HOSPITAL_COMMUNITY): Payer: Medicare HMO

## 2013-12-16 ENCOUNTER — Encounter: Payer: Self-pay | Admitting: Cardiovascular Disease

## 2013-12-16 LAB — PROTIME-INR

## 2013-12-24 ENCOUNTER — Other Ambulatory Visit (HOSPITAL_BASED_OUTPATIENT_CLINIC_OR_DEPARTMENT_OTHER): Payer: Self-pay | Admitting: Family Medicine

## 2013-12-24 DIAGNOSIS — K863 Pseudocyst of pancreas: Principal | ICD-10-CM

## 2013-12-24 DIAGNOSIS — K862 Cyst of pancreas: Secondary | ICD-10-CM

## 2014-01-05 ENCOUNTER — Ambulatory Visit (HOSPITAL_COMMUNITY): Payer: Medicare HMO | Attending: Cardiology | Admitting: Radiology

## 2014-01-05 DIAGNOSIS — I079 Rheumatic tricuspid valve disease, unspecified: Secondary | ICD-10-CM | POA: Insufficient documentation

## 2014-01-05 DIAGNOSIS — I10 Essential (primary) hypertension: Secondary | ICD-10-CM | POA: Insufficient documentation

## 2014-01-05 DIAGNOSIS — R609 Edema, unspecified: Secondary | ICD-10-CM | POA: Insufficient documentation

## 2014-01-05 DIAGNOSIS — Z87891 Personal history of nicotine dependence: Secondary | ICD-10-CM | POA: Insufficient documentation

## 2014-01-05 DIAGNOSIS — R0989 Other specified symptoms and signs involving the circulatory and respiratory systems: Secondary | ICD-10-CM | POA: Insufficient documentation

## 2014-01-05 DIAGNOSIS — I359 Nonrheumatic aortic valve disorder, unspecified: Secondary | ICD-10-CM

## 2014-01-05 DIAGNOSIS — I350 Nonrheumatic aortic (valve) stenosis: Secondary | ICD-10-CM

## 2014-01-05 DIAGNOSIS — I080 Rheumatic disorders of both mitral and aortic valves: Secondary | ICD-10-CM | POA: Insufficient documentation

## 2014-01-05 DIAGNOSIS — R011 Cardiac murmur, unspecified: Secondary | ICD-10-CM | POA: Insufficient documentation

## 2014-01-05 DIAGNOSIS — I4891 Unspecified atrial fibrillation: Secondary | ICD-10-CM | POA: Insufficient documentation

## 2014-01-05 DIAGNOSIS — E119 Type 2 diabetes mellitus without complications: Secondary | ICD-10-CM | POA: Insufficient documentation

## 2014-01-05 NOTE — Progress Notes (Signed)
Echocardiogram performed.  

## 2014-01-06 ENCOUNTER — Ambulatory Visit (HOSPITAL_BASED_OUTPATIENT_CLINIC_OR_DEPARTMENT_OTHER)
Admission: RE | Admit: 2014-01-06 | Discharge: 2014-01-06 | Disposition: A | Discharge status: Home / Self Care | Payer: Medicare HMO | Source: Ambulatory Visit | Attending: Family Medicine | Admitting: Family Medicine

## 2014-01-06 ENCOUNTER — Encounter (HOSPITAL_BASED_OUTPATIENT_CLINIC_OR_DEPARTMENT_OTHER): Payer: Self-pay

## 2014-01-06 DIAGNOSIS — K8020 Calculus of gallbladder without cholecystitis without obstruction: Secondary | ICD-10-CM | POA: Insufficient documentation

## 2014-01-06 DIAGNOSIS — K862 Cyst of pancreas: Secondary | ICD-10-CM | POA: Insufficient documentation

## 2014-01-06 DIAGNOSIS — K863 Pseudocyst of pancreas: Principal | ICD-10-CM

## 2014-01-06 DIAGNOSIS — R109 Unspecified abdominal pain: Secondary | ICD-10-CM | POA: Insufficient documentation

## 2014-01-06 DIAGNOSIS — K4090 Unilateral inguinal hernia, without obstruction or gangrene, not specified as recurrent: Secondary | ICD-10-CM | POA: Insufficient documentation

## 2014-01-06 MED ORDER — IOHEXOL 300 MG/ML  SOLN
100.0000 mL | Freq: Once | INTRAMUSCULAR | Status: AC | PRN
  Administered 2014-01-06: 100 mL via INTRAVENOUS

## 2014-01-06 NOTE — Discharge Instructions (Signed)
° ° °  Outpatient Metformin Instructions (Glucophage, Glucovance, Fortamet, Riomet, Metaglip, Glumetza, Actoplus met  Avandamet, Janumet)   Patient: Destiny [name]                                                01/06/2014:    Radiology Exam:     As part of your exam today in the Radiology Department, you were given a radiographic contrast material or x-ray dye.  Because you have had this contrast material and you are taking a Metformin drug (Glucophage, Glucovance, Avandamet, Fortamet, Riomet, Metaglip, Glumetza, Actoplus met, Actoplus Met XR, Prandimet or Janumet), please observe the following instructions:   DO NOT  Take this medication for 48 hours after your exam.  Because you have normal renal function and have no comorbidities, you may restart your medication in 48 hours with no need for a renal function test or consultation with your physician.  You have normal renal function but have some comorbidities.  Comorbidities include liver disease, alcohol overuse, heart failure, myocardial or muscular ischemia, sepsis, or other severe infection.  Therefore you should consult your physician before restarting your medication.  You have impaired renal function.  You should consult your physician before restarting your medication and you are advised to get a renal function test before restarting your medication.  Please discuss this with your physician.   Call your doctor before you start taking this medication again.  Your doctor may want to check your kidney function before you start taking this medication again.  I understand these instructions and have had an opportunity to discuss them with Radiology Department personnel.

## 2014-01-14 ENCOUNTER — Telehealth: Payer: Self-pay | Admitting: Cardiovascular Disease

## 2014-01-14 NOTE — Telephone Encounter (Signed)
Echo report routed to Dr. Johnsie Cancel for review. Will contact pt once reviewed.

## 2014-01-14 NOTE — Telephone Encounter (Signed)
Spoke with pt and let her know Dr.Nishan needs to review echo and we will call after reviewed.  She will not be home until after 4:30 tomorrow.

## 2014-01-14 NOTE — Telephone Encounter (Signed)
New message ° ° ° ° ° °Want echo results °

## 2014-01-15 NOTE — Telephone Encounter (Signed)
Just mild AS echo in a year ----- Message ----- From: Baxter Flattery Sent: 01/14/2014 3:46 PM To: Josue Hector, MD ECHO REPORT  PT  AWARE OF  ECHO  RESULTS COPY  MAILED  AT PT'S REQUEST .Adonis Housekeeper

## 2014-04-20 ENCOUNTER — Encounter: Payer: Self-pay | Admitting: Cardiovascular Disease

## 2014-04-20 ENCOUNTER — Ambulatory Visit (INDEPENDENT_AMBULATORY_CARE_PROVIDER_SITE_OTHER): Payer: Commercial Managed Care - HMO | Admitting: Cardiovascular Disease

## 2014-04-20 VITALS — BP 158/66 | HR 46 | Ht 62.0 in | Wt 152.4 lb

## 2014-04-20 DIAGNOSIS — I482 Chronic atrial fibrillation, unspecified: Secondary | ICD-10-CM

## 2014-04-20 DIAGNOSIS — R609 Edema, unspecified: Secondary | ICD-10-CM | POA: Diagnosis not present

## 2014-04-20 DIAGNOSIS — I10 Essential (primary) hypertension: Secondary | ICD-10-CM | POA: Diagnosis not present

## 2014-04-20 DIAGNOSIS — I4891 Unspecified atrial fibrillation: Secondary | ICD-10-CM | POA: Diagnosis not present

## 2014-04-20 DIAGNOSIS — E7800 Pure hypercholesterolemia, unspecified: Secondary | ICD-10-CM

## 2014-04-20 LAB — CBC WITH DIFFERENTIAL/PLATELET
Basophils Absolute: 0.1 10*3/uL (ref 0.0–0.1)
Basophils Relative: 1.3 % (ref 0.0–3.0)
EOS PCT: 3.1 % (ref 0.0–5.0)
Eosinophils Absolute: 0.3 10*3/uL (ref 0.0–0.7)
HEMATOCRIT: 37.1 % (ref 36.0–46.0)
HEMOGLOBIN: 12.1 g/dL (ref 12.0–15.0)
LYMPHS ABS: 2.1 10*3/uL (ref 0.7–4.0)
Lymphocytes Relative: 25.8 % (ref 12.0–46.0)
MCHC: 32.6 g/dL (ref 30.0–36.0)
MCV: 93.7 fl (ref 78.0–100.0)
MONOS PCT: 9.8 % (ref 3.0–12.0)
Monocytes Absolute: 0.8 10*3/uL (ref 0.1–1.0)
Neutro Abs: 5 10*3/uL (ref 1.4–7.7)
Neutrophils Relative %: 60 % (ref 43.0–77.0)
PLATELETS: 176 10*3/uL (ref 150.0–400.0)
RBC: 3.96 Mil/uL (ref 3.87–5.11)
RDW: 14.9 % (ref 11.5–15.5)
WBC: 8.3 10*3/uL (ref 4.0–10.5)

## 2014-04-20 LAB — BASIC METABOLIC PANEL
BUN: 10 mg/dL (ref 6–23)
CALCIUM: 9.8 mg/dL (ref 8.4–10.5)
CHLORIDE: 101 meq/L (ref 96–112)
CO2: 29 meq/L (ref 19–32)
Creatinine, Ser: 0.6 mg/dL (ref 0.4–1.2)
GFR: 109.22 mL/min (ref >60.00)
GLUCOSE: 105 mg/dL — AB (ref 70–99)
Potassium: 3.6 mEq/L (ref 3.5–5.1)
SODIUM: 137 meq/L (ref 135–145)

## 2014-04-20 NOTE — Assessment & Plan Note (Signed)
Continue current meds dependant from varicosities  Stable and non painful

## 2014-04-20 NOTE — Assessment & Plan Note (Signed)
Well controlled.  Continue current medications and low sodium Dash type diet.    

## 2014-04-20 NOTE — Patient Instructions (Addendum)
Your physician recommends that you schedule a follow-up appointment in: [place]  [place] Your physician has recommended you make the following change in your medication: STOP  DIGOXIN AND  CARDIZEM  AND  DECREASE CARVEDILOL  25 MG  TO  1 TAB  DAILY  Your physician recommends that you return for lab work in: [place]

## 2014-04-20 NOTE — Progress Notes (Signed)
Patient ID: REATHEL TURI, female   DOB: 10/13/1934, 79 y.o.   MRN: [id number] Destiny [name] is seen today for F/U chronic afib, She has HTN and lower extremity edema from varicosities. She had successful right knee replacement by Dr. Maureen Ralphs in December 2010 . She continues to struggle with her work at [employer].She has a history of mild MR, EF normal and mild AS . She has been compliant wi th her meds. There is no history of SSSCP or CAD. Has postural dizzyness.     Tried Xarelto but cost too much Back on coumadin since 2013 Normally had venous INR done at [place] office  Last year was off coumadin for kidney stone surgery and did not require lovenox bridge  Reviewed labs from [place]: LDL 62 A1c 7.2 Normal LFT;s   Echo 3/15  Study Conclusions  - Left ventricle: The cavity size was mildly dilated. Wall thickness was increased in a pattern of moderate LVH. Systolic function was normal. The estimated ejection fraction was in the range of 60% to 65%. Wall motion was normal; there were no regional wall motion abnormalities. - Aortic valve: There was mild stenosis. - Left atrium: The atrium was moderately to severely dilated. - Right atrium: The atrium was moderately dilated.  Mean gradient 15 mmHg peak 27   Some diarrhea  No presyncope or chest pain    ROS: Denies fever, malais, weight loss, blurry vision, decreased visual acuity, cough, sputum, SOB, hemoptysis, pleuritic pain, palpitaitons, heartburn, abdominal pain, melena, lower extremity edema, claudication, or rash.  All other systems reviewed and negative  General: Affect appropriate Healthy:  appears stated age 79: normal Neck supple with no adenopathy JVP normal no bruits no thyromegaly Lungs clear with no wheezing and good diaphragmatic motion Heart:  S1/S2 AS  murmur, no rub, gallop or click PMI normal Abdomen: benighn, BS positve, no tenderness, no AAA no bruit.  No HSM or HJR Distal pulses intact with no  bruits Plus one bilateral  Edema  With varicosities  Neuro non-focal Skin warm and dry No muscular weakness   Current Outpatient Prescriptions  Medication Sig Dispense Refill  . atorvastatin (LIPITOR) 10 MG tablet Take 10 mg by mouth daily.        . Calcium Carbonate-Vit D-Min 600-400 MG-UNIT TABS Take by mouth daily.        Marland Kitchen CARTIA XT 240 MG 24 hr capsule Take 1 capsule by mouth daily.      . carvedilol (COREG) 25 MG tablet Take 25 mg by mouth 2 (two) times daily.        . Cinnamon 500 MG capsule Take 500 mg by mouth daily.        . cyanocobalamin 500 MCG tablet Take 500 mcg by mouth daily.        . digoxin (LANOXIN) 0.25 MG tablet Take 250 mcg by mouth daily.        Marland Kitchen diltiazem (CARDIZEM) 120 MG tablet Take 120 mg by mouth 2 (two) times daily.      . dorzolamide-timolol (COSOPT) 22.3-6.8 MG/ML ophthalmic solution 1 drop 2 (two) times daily.       Marland Kitchen glipiZIDE (GLUCOTROL) 10 MG 24 hr tablet Take 10 mg by mouth daily.        . hydrochlorothiazide (MICROZIDE) 12.5 MG capsule Take 12.5 mg by mouth daily.      . insulin glargine (LANTUS) 100 UNIT/ML injection Inject into the skin at bedtime. 32 units      . latanoprost (XALATAN) 0.005 %  ophthalmic solution 1 drop daily.        Marland Kitchen linagliptin (TRADJENTA) 5 MG TABS tablet Take 5 mg by mouth daily.      Marland Kitchen lisinopril (PRINIVIL,ZESTRIL) 40 MG tablet Take 40 mg by mouth daily.        . Magnesium Oxide 250 MG TABS Take by mouth daily.        . metFORMIN (GLUMETZA) 500 MG (MOD) 24 hr tablet 4 tabs by  daily      . Multiple Vitamins-Minerals (MULTIVITAL) tablet Take 1 tablet by mouth daily.        . Omega-3 Fatty Acids (FISH OIL) 1000 MG CAPS Take by mouth daily.        . ONE TOUCH ULTRA TEST test strip       . potassium chloride SA (K-DUR,KLOR-CON) 20 MEQ tablet Take 20 mEq by mouth 2 (two) times daily.        . vitamin E 400 UNIT capsule Take 400 Units by mouth daily.        Marland Kitchen warfarin (COUMADIN) 5 MG tablet AS DIRECTED. Restart after ureteral  stent removal.  90 tablet  3   No current facility-administered medications for this visit.    Allergies  Review of patient's allergies indicates no known allergies.  Electrocardiogram:   11/29/11  afib rate 64 otherwise normal  Today junctional rate 46 QT 424/371 Dig effect   Assessment and Plan

## 2014-04-20 NOTE — Assessment & Plan Note (Signed)
Cholesterol is at goal.  Continue current dose of statin and diet Rx.  No myalgias or side effects.  F/U  LFT's in 6 months. No results found for this basename: [facility]  Labs with Dr Maceo Pro

## 2014-04-20 NOTE — Assessment & Plan Note (Signed)
Stop cardizem and dig  Decrease beta blocker  Check BMET and digoxin level today No indication for hospitalization.  INR being followed by Dr Maceo Pro   She had "vaginal" bleeding with xarelto and it was too expensive for her

## 2014-04-30 ENCOUNTER — Encounter: Payer: Self-pay | Admitting: Cardiovascular Disease

## 2014-04-30 LAB — PROTIME-INR

## 2014-06-29 ENCOUNTER — Telehealth: Payer: Self-pay | Admitting: Cardiovascular Disease

## 2014-06-29 NOTE — Telephone Encounter (Signed)
New message           Pt would like Altha Harm to give her a call concerning some paperwork she just received in the mail

## 2014-06-29 NOTE — Telephone Encounter (Signed)
PT  HAS  FORM THAT NEEDS TO BE COMPLETED  WILL BRING  TO [facility] ON  Friday   AND WILL BE COMPLETED  AND WILL FAX  TO   [facility]   AT  PT'S REQUEST./CY

## 2014-07-02 ENCOUNTER — Ambulatory Visit (INDEPENDENT_AMBULATORY_CARE_PROVIDER_SITE_OTHER): Payer: Commercial Managed Care - HMO | Admitting: Cardiovascular Disease

## 2014-07-02 ENCOUNTER — Encounter: Payer: Self-pay | Admitting: Cardiovascular Disease

## 2014-07-02 ENCOUNTER — Ambulatory Visit: Payer: Commercial Managed Care - HMO | Admitting: Cardiovascular Disease

## 2014-07-02 VITALS — BP 132/82 | HR 60 | Ht 62.0 in | Wt 157.1 lb

## 2014-07-02 DIAGNOSIS — I10 Essential (primary) hypertension: Secondary | ICD-10-CM

## 2014-07-02 DIAGNOSIS — L989 Disorder of the skin and subcutaneous tissue, unspecified: Secondary | ICD-10-CM

## 2014-07-02 DIAGNOSIS — E7800 Pure hypercholesterolemia, unspecified: Secondary | ICD-10-CM

## 2014-07-02 DIAGNOSIS — I4891 Unspecified atrial fibrillation: Secondary | ICD-10-CM

## 2014-07-02 DIAGNOSIS — E119 Type 2 diabetes mellitus without complications: Secondary | ICD-10-CM

## 2014-07-02 DIAGNOSIS — I4819 Other persistent atrial fibrillation: Secondary | ICD-10-CM

## 2014-07-02 NOTE — Progress Notes (Signed)
Patient ID: Destiny [name], female   DOB: [birth year], 79 y.o.   MRN: [id number] Destiny [name] is seen today for F/U chronic afib, She has HTN and lower extremity edema from varicosities. She had successful right knee replacement by Dr. Maureen Ralphs in December 2010 . She continues to struggle with her work at [employer].She has a history of mild MR, EF normal and mild AS . She has been compliant wi th her meds. There is no history of SSSCP or CAD. Has postural dizzyness.  Tried Xarelto but cost too much Back on coumadin since 2013 Normally had venous INR done at [place] office Last year was off coumadin for kidney stone surgery and did not require lovenox bridge  Reviewed labs from [facility]: LDL 62 A1c 7.2 Normal LFT;s  Echo 3/15  Study Conclusions  - Left ventricle: The cavity size was mildly dilated. Wall thickness was increased in a pattern of moderate LVH. Systolic function was normal. The estimated ejection fraction was in the range of 60% to 65%. Wall motion was normal; there were no regional wall motion abnormalities. - Aortic valve: There was mild stenosis. - Left atrium: The atrium was moderately to severely dilated. - Right atrium: The atrium was moderately dilated.  Mean gradient 15 mmHg peak 27   Last visit HR 46  And cardizem / Digoxen stopped  Feels much better with more energy Insulin dose also lowered    ROS: Denies fever, malais, weight loss, blurry vision, decreased visual acuity, cough, sputum, SOB, hemoptysis, pleuritic pain, palpitaitons, heartburn, abdominal pain, melena, lower extremity edema, claudication, or rash.  All other systems reviewed and negative  General: Affect appropriate Healthy:  appears stated age 79: normal Neck supple with no adenopathy JVP normal no bruits no thyromegaly Lungs rhonchi mild  Wheezing LLL  and good diaphragmatic motion Heart:  S1/S2 no murmur, no rub, gallop or click PMI normal Abdomen: benighn, BS positve, no tenderness, no AAA no  bruit.  No HSM or HJR Distal pulses intact with no bruits No edema Neuro non-focal Skin warm and dry  Basal cell over left temporal area  No muscular weakness   Current Outpatient Prescriptions  Medication Sig Dispense Refill  . atorvastatin (LIPITOR) 10 MG tablet Take 10 mg by mouth daily.        . Calcium Carbonate-Vit D-Min 600-400 MG-UNIT TABS Take by mouth daily.        Marland Kitchen CARTIA XT 240 MG 24 hr capsule Take 1 capsule by mouth daily.      . carvedilol (COREG) 25 MG tablet Take 25 mg by mouth 2 (two) times daily.        . Cinnamon 500 MG capsule Take 500 mg by mouth daily.        . cyanocobalamin 500 MCG tablet Take 500 mcg by mouth daily.        . dorzolamide-timolol (COSOPT) 22.3-6.8 MG/ML ophthalmic solution 1 drop 2 (two) times daily.       Marland Kitchen glipiZIDE (GLUCOTROL) 10 MG 24 hr tablet Take 10 mg by mouth daily.        . hydrochlorothiazide (MICROZIDE) 12.5 MG capsule Take 12.5 mg by mouth daily.      . insulin glargine (LANTUS) 100 UNIT/ML injection Inject into the skin at bedtime. 32 units      . latanoprost (XALATAN) 0.005 % ophthalmic solution 1 drop daily.        Marland Kitchen linagliptin (TRADJENTA) 5 MG TABS tablet Take 5 mg by mouth daily.      Marland Kitchen  lisinopril (PRINIVIL,ZESTRIL) 40 MG tablet Take 40 mg by mouth daily.        . Magnesium Oxide 250 MG TABS Take by mouth daily.        . metFORMIN (GLUMETZA) 500 MG (MOD) 24 hr tablet 4 tabs by  daily      . Multiple Vitamins-Minerals (MULTIVITAL) tablet Take 1 tablet by mouth daily.        . Omega-3 Fatty Acids (FISH OIL) 1000 MG CAPS Take by mouth daily.        . ONE TOUCH ULTRA TEST test strip       . potassium chloride SA (K-DUR,KLOR-CON) 20 MEQ tablet Take 20 mEq by mouth 2 (two) times daily.        . vitamin E 400 UNIT capsule Take 400 Units by mouth daily.        Marland Kitchen warfarin (COUMADIN) 5 MG tablet AS DIRECTED. Restart after ureteral stent removal.  90 tablet  3   No current facility-administered medications for this visit.     Allergies  Review of patient's allergies indicates no known allergies.  Electrocardiogram: afib rate 56  LVH with strain   Assessment and Plan

## 2014-07-02 NOTE — Assessment & Plan Note (Signed)
Cholesterol is at goal.  Continue current dose of statin and diet Rx.  No myalgias or side effects.  F/U  LFT's in 6 months. No results found for this basename: LDLCALC             

## 2014-07-02 NOTE — Assessment & Plan Note (Signed)
Discussed low carb diet.  Target hemoglobin A1c is 6.5 or less.  Continue current medications. F/U Dr Maceo Pro  Insulin dose recently decreased

## 2014-07-02 NOTE — Assessment & Plan Note (Signed)
Well controlled.  Continue current medications and low sodium Dash type diet.    

## 2014-07-02 NOTE — Assessment & Plan Note (Signed)
Good rate control and anticoagulation bradycardia resolved off diltiazem and digoxen

## 2014-07-02 NOTE — Patient Instructions (Signed)
Your physician wants you to follow-up in:  6 MONTHS WITH DR NISHAN  You will receive a reminder letter in the mail two months in advance. If you don't receive a letter, please call our office to schedule the follow-up appointment. Your physician recommends that you continue on your current medications as directed. Please refer to the Current Medication list given to you today. 

## 2014-07-02 NOTE — Assessment & Plan Note (Signed)
Appears to have basal cell carcinoma left temporal area.  Dr Maceo Pro to do biopsy next week  Hold coumadin 2 days before Prefer f/u with Dr. Janann August for wide excision if needed

## 2014-07-13 ENCOUNTER — Emergency Department (HOSPITAL_COMMUNITY): Payer: Medicare HMO

## 2014-07-13 ENCOUNTER — Encounter (HOSPITAL_COMMUNITY): Payer: Self-pay | Admitting: Emergency Medicine

## 2014-07-13 ENCOUNTER — Observation Stay (HOSPITAL_COMMUNITY)
Admission: EM | Admit: 2014-07-13 | Discharge: 2014-07-15 | Disposition: A | Discharge status: Home / Self Care | Payer: Medicare HMO | Attending: Family Medicine | Admitting: Family Medicine

## 2014-07-13 DIAGNOSIS — Y92099 Unspecified place in other non-institutional residence as the place of occurrence of the external cause: Secondary | ICD-10-CM

## 2014-07-13 DIAGNOSIS — H409 Unspecified glaucoma: Secondary | ICD-10-CM | POA: Diagnosis not present

## 2014-07-13 DIAGNOSIS — Y929 Unspecified place or not applicable: Secondary | ICD-10-CM | POA: Diagnosis not present

## 2014-07-13 DIAGNOSIS — Z794 Long term (current) use of insulin: Secondary | ICD-10-CM | POA: Insufficient documentation

## 2014-07-13 DIAGNOSIS — R109 Unspecified abdominal pain: Principal | ICD-10-CM

## 2014-07-13 DIAGNOSIS — E785 Hyperlipidemia, unspecified: Secondary | ICD-10-CM | POA: Diagnosis not present

## 2014-07-13 DIAGNOSIS — W19XXXA Unspecified fall, initial encounter: Secondary | ICD-10-CM | POA: Diagnosis not present

## 2014-07-13 DIAGNOSIS — Y92009 Unspecified place in unspecified non-institutional (private) residence as the place of occurrence of the external cause: Secondary | ICD-10-CM

## 2014-07-13 DIAGNOSIS — M1990 Unspecified osteoarthritis, unspecified site: Secondary | ICD-10-CM | POA: Insufficient documentation

## 2014-07-13 DIAGNOSIS — R0902 Hypoxemia: Secondary | ICD-10-CM | POA: Diagnosis not present

## 2014-07-13 DIAGNOSIS — I340 Nonrheumatic mitral (valve) insufficiency: Secondary | ICD-10-CM | POA: Diagnosis not present

## 2014-07-13 DIAGNOSIS — Z79899 Other long term (current) drug therapy: Secondary | ICD-10-CM | POA: Diagnosis not present

## 2014-07-13 DIAGNOSIS — I358 Other nonrheumatic aortic valve disorders: Secondary | ICD-10-CM | POA: Diagnosis not present

## 2014-07-13 DIAGNOSIS — Z7901 Long term (current) use of anticoagulants: Secondary | ICD-10-CM | POA: Insufficient documentation

## 2014-07-13 DIAGNOSIS — Z87891 Personal history of nicotine dependence: Secondary | ICD-10-CM | POA: Insufficient documentation

## 2014-07-13 DIAGNOSIS — I10 Essential (primary) hypertension: Secondary | ICD-10-CM | POA: Insufficient documentation

## 2014-07-13 DIAGNOSIS — I482 Chronic atrial fibrillation, unspecified: Secondary | ICD-10-CM

## 2014-07-13 DIAGNOSIS — E119 Type 2 diabetes mellitus without complications: Secondary | ICD-10-CM

## 2014-07-13 DIAGNOSIS — I4891 Unspecified atrial fibrillation: Secondary | ICD-10-CM

## 2014-07-13 DIAGNOSIS — K862 Cyst of pancreas: Secondary | ICD-10-CM | POA: Insufficient documentation

## 2014-07-13 DIAGNOSIS — R10A1 Flank pain, right side: Secondary | ICD-10-CM

## 2014-07-13 LAB — COMPREHENSIVE METABOLIC PANEL
ALBUMIN: 3.3 g/dL — AB (ref 3.5–5.2)
ALT: 15 U/L (ref 0–35)
ANION GAP: 11 (ref 5–15)
AST: 23 U/L (ref 0–37)
Alkaline Phosphatase: 35 U/L — ABNORMAL LOW (ref 39–117)
BILIRUBIN TOTAL: 0.7 mg/dL (ref 0.3–1.2)
BUN: 10 mg/dL (ref 6–23)
CALCIUM: 8.5 mg/dL (ref 8.4–10.5)
CHLORIDE: 102 meq/L (ref 96–112)
CO2: 25 mEq/L (ref 19–32)
CREATININE: 0.48 mg/dL — AB (ref 0.50–1.10)
GFR calc Af Amer: >90 mL/min (ref >90)
GFR calc non Af Amer: >90 mL/min (ref >90)
Glucose, Bld: 185 mg/dL — ABNORMAL HIGH (ref 70–99)
Potassium: 4.1 mEq/L (ref 3.7–5.3)
Sodium: 138 mEq/L (ref 137–147)
TOTAL PROTEIN: 6.6 g/dL (ref 6.0–8.3)

## 2014-07-13 LAB — CBC WITH DIFFERENTIAL/PLATELET
Basophils Absolute: 0 10*3/uL (ref 0.0–0.1)
Basophils Absolute: 0 10*3/uL (ref 0.0–0.1)
Basophils Relative: 0 % (ref 0–1)
Basophils Relative: 0 % (ref 0–1)
EOS ABS: 0 10*3/uL (ref 0.0–0.7)
EOS PCT: 0 % (ref 0–5)
Eosinophils Absolute: 0 10*3/uL (ref 0.0–0.7)
Eosinophils Relative: 0 % (ref 0–5)
HCT: 34.8 % — ABNORMAL LOW (ref 36.0–46.0)
HEMATOCRIT: 37.4 % (ref 36.0–46.0)
HEMOGLOBIN: 11.2 g/dL — AB (ref 12.0–15.0)
HEMOGLOBIN: 12.3 g/dL (ref 12.0–15.0)
LYMPHS ABS: 0.9 10*3/uL (ref 0.7–4.0)
LYMPHS PCT: 17 % (ref 12–46)
Lymphocytes Relative: 9 % — ABNORMAL LOW (ref 12–46)
Lymphs Abs: 1.2 10*3/uL (ref 0.7–4.0)
MCH: 30.1 pg (ref 26.0–34.0)
MCH: 30.8 pg (ref 26.0–34.0)
MCHC: 32.9 g/dL (ref 30.0–36.0)
MCHC: 32.2 g/dL (ref 30.0–36.0)
MCV: 93.5 fL (ref 78.0–100.0)
MCV: 93.7 fL (ref 78.0–100.0)
MONO ABS: 0.6 10*3/uL (ref 0.1–1.0)
MONOS PCT: 6 % (ref 3–12)
Monocytes Absolute: 0.7 10*3/uL (ref 0.1–1.0)
Monocytes Relative: 9 % (ref 3–12)
NEUTROS ABS: 5.4 10*3/uL (ref 1.7–7.7)
Neutro Abs: 8.5 10*3/uL — ABNORMAL HIGH (ref 1.7–7.7)
Neutrophils Relative %: 85 % — ABNORMAL HIGH (ref 43–77)
Neutrophils Relative %: 74 % (ref 43–77)
PLATELETS: 195 10*3/uL (ref 150–400)
Platelets: 179 10*3/uL (ref 150–400)
RBC: 3.72 MIL/uL — AB (ref 3.87–5.11)
RBC: 3.99 MIL/uL (ref 3.87–5.11)
RDW: 14.3 % (ref 11.5–15.5)
RDW: 14.6 % (ref 11.5–15.5)
WBC: 9.9 10*3/uL (ref 4.0–10.5)
WBC: 7.3 10*3/uL (ref 4.0–10.5)

## 2014-07-13 LAB — GLUCOSE, CAPILLARY
GLUCOSE-CAPILLARY: 170 mg/dL — AB (ref 70–99)
Glucose-Capillary: 171 mg/dL — ABNORMAL HIGH (ref 70–99)
Glucose-Capillary: 176 mg/dL — ABNORMAL HIGH (ref 70–99)
Glucose-Capillary: 156 mg/dL — ABNORMAL HIGH (ref 70–99)

## 2014-07-13 LAB — BASIC METABOLIC PANEL
Anion gap: 15 (ref 5–15)
BUN: 12 mg/dL (ref 6–23)
CALCIUM: 9.4 mg/dL (ref 8.4–10.5)
CO2: 25 meq/L (ref 19–32)
CREATININE: 0.43 mg/dL — AB (ref 0.50–1.10)
Chloride: 95 mEq/L — ABNORMAL LOW (ref 96–112)
GFR calc Af Amer: >90 mL/min (ref >90)
GLUCOSE: 266 mg/dL — AB (ref 70–99)
Potassium: 3.9 mEq/L (ref 3.7–5.3)
Sodium: 135 mEq/L — ABNORMAL LOW (ref 137–147)

## 2014-07-13 LAB — PROTIME-INR
INR: 1.4 (ref 0.00–1.49)
Prothrombin Time: 17.3 seconds — ABNORMAL HIGH (ref 11.6–15.2)

## 2014-07-13 LAB — I-STAT CHEM 8, ED
BUN: 10 mg/dL (ref 6–23)
CALCIUM ION: 1.21 mmol/L (ref 1.13–1.30)
CHLORIDE: 98 meq/L (ref 96–112)
Creatinine, Ser: 0.5 mg/dL (ref 0.50–1.10)
GLUCOSE: 267 mg/dL — AB (ref 70–99)
HCT: 41 % (ref 36.0–46.0)
Hemoglobin: 13.9 g/dL (ref 12.0–15.0)
Potassium: 3.6 mEq/L — ABNORMAL LOW (ref 3.7–5.3)
Sodium: 137 mEq/L (ref 137–147)
TCO2: 27 mmol/L (ref 0–100)

## 2014-07-13 LAB — TROPONIN I: Troponin I: <0.3 ng/mL (ref <0.30)

## 2014-07-13 LAB — PRO B NATRIURETIC PEPTIDE: Pro B Natriuretic peptide (BNP): 2973 pg/mL — ABNORMAL HIGH (ref 0–450)

## 2014-07-13 LAB — CK: CK TOTAL: 101 U/L (ref 7–177)

## 2014-07-13 MED ORDER — VITAMIN E 180 MG (400 UNIT) PO CAPS
400.0000 [IU] | ORAL_CAPSULE | Freq: Every day | ORAL | Status: DC
  Administered 2014-07-13 – 2014-07-15 (×3): 400 [IU] via ORAL
  Filled 2014-07-13 (×3): qty 1

## 2014-07-13 MED ORDER — WARFARIN - PHARMACIST DOSING INPATIENT: Freq: Every day | Status: DC

## 2014-07-13 MED ORDER — FENTANYL CITRATE 0.05 MG/ML IJ SOLN
50.0000 ug | Freq: Once | INTRAMUSCULAR | Status: AC
  Administered 2014-07-13: 50 ug via INTRAVENOUS
  Filled 2014-07-13: qty 2

## 2014-07-13 MED ORDER — FISH OIL 1000 MG PO CAPS: 1.0000 | ORAL_CAPSULE | Freq: Every day | ORAL | Status: DC

## 2014-07-13 MED ORDER — HYDROCHLOROTHIAZIDE 12.5 MG PO CAPS
12.5000 mg | ORAL_CAPSULE | Freq: Every day | ORAL | Status: DC
  Administered 2014-07-13 – 2014-07-14 (×2): 12.5 mg via ORAL
  Filled 2014-07-13 (×3): qty 1

## 2014-07-13 MED ORDER — INSULIN GLARGINE 100 UNIT/ML ~~LOC~~ SOLN
16.0000 [IU] | Freq: Every day | SUBCUTANEOUS | Status: DC
  Administered 2014-07-13 – 2014-07-14 (×2): 16 [IU] via SUBCUTANEOUS
  Filled 2014-07-13 (×3): qty 0.16

## 2014-07-13 MED ORDER — ONDANSETRON HCL 4 MG PO TABS: 4.0000 mg | ORAL_TABLET | Freq: Four times a day (QID) | ORAL | Status: DC | PRN

## 2014-07-13 MED ORDER — ACETAMINOPHEN 650 MG RE SUPP: 650.0000 mg | Freq: Four times a day (QID) | RECTAL | Status: DC | PRN

## 2014-07-13 MED ORDER — CARVEDILOL 25 MG PO TABS
25.0000 mg | ORAL_TABLET | Freq: Once | ORAL | Status: AC
  Administered 2014-07-13: 25 mg via ORAL
  Filled 2014-07-13: qty 1

## 2014-07-13 MED ORDER — DILTIAZEM LOAD VIA INFUSION
5.0000 mg | Freq: Once | INTRAVENOUS | Status: DC
  Filled 2014-07-13: qty 5

## 2014-07-13 MED ORDER — SODIUM CHLORIDE 0.9 % IJ SOLN
3.0000 mL | Freq: Two times a day (BID) | INTRAMUSCULAR | Status: DC
  Administered 2014-07-13 – 2014-07-15 (×2): 3 mL via INTRAVENOUS

## 2014-07-13 MED ORDER — ONDANSETRON HCL 4 MG/2ML IJ SOLN
4.0000 mg | Freq: Four times a day (QID) | INTRAMUSCULAR | Status: DC | PRN
  Administered 2014-07-13 – 2014-07-14 (×3): 4 mg via INTRAVENOUS
  Filled 2014-07-13 (×3): qty 2

## 2014-07-13 MED ORDER — ACETAMINOPHEN 325 MG PO TABS
650.0000 mg | ORAL_TABLET | Freq: Four times a day (QID) | ORAL | Status: DC | PRN
  Administered 2014-07-13 – 2014-07-14 (×2): 650 mg via ORAL
  Filled 2014-07-13 (×2): qty 2

## 2014-07-13 MED ORDER — IOHEXOL 300 MG/ML  SOLN
100.0000 mL | Freq: Once | INTRAMUSCULAR | Status: AC | PRN
  Administered 2014-07-13: 100 mL via INTRAVENOUS

## 2014-07-13 MED ORDER — SODIUM CHLORIDE 0.9 % IV BOLUS (SEPSIS)
1000.0000 mL | Freq: Once | INTRAVENOUS | Status: AC
  Administered 2014-07-13: 1000 mL via INTRAVENOUS

## 2014-07-13 MED ORDER — MORPHINE SULFATE 2 MG/ML IJ SOLN
1.0000 mg | INTRAMUSCULAR | Status: DC | PRN
  Administered 2014-07-13 – 2014-07-14 (×7): 1 mg via INTRAVENOUS
  Filled 2014-07-13 (×7): qty 1

## 2014-07-13 MED ORDER — ADULT MULTIVITAMIN W/MINERALS CH
1.0000 | ORAL_TABLET | Freq: Every day | ORAL | Status: DC
  Administered 2014-07-13 – 2014-07-15 (×3): 1 via ORAL
  Filled 2014-07-13 (×3): qty 1

## 2014-07-13 MED ORDER — DILTIAZEM HCL 100 MG IV SOLR
5.0000 mg/h | INTRAVENOUS | Status: DC
  Filled 2014-07-13: qty 100

## 2014-07-13 MED ORDER — CYANOCOBALAMIN 500 MCG PO TABS
500.0000 ug | ORAL_TABLET | Freq: Every day | ORAL | Status: DC
  Administered 2014-07-14 – 2014-07-15 (×2): 500 ug via ORAL
  Filled 2014-07-13 (×2): qty 1

## 2014-07-13 MED ORDER — FUROSEMIDE 10 MG/ML IJ SOLN
20.0000 mg | Freq: Once | INTRAMUSCULAR | Status: AC
  Administered 2014-07-13: 20 mg via INTRAVENOUS
  Filled 2014-07-13: qty 2

## 2014-07-13 MED ORDER — MULTIVITAL PO TABS: 1.0000 | ORAL_TABLET | Freq: Every day | ORAL | Status: DC

## 2014-07-13 MED ORDER — DORZOLAMIDE HCL-TIMOLOL MAL 2-0.5 % OP SOLN
1.0000 [drp] | Freq: Two times a day (BID) | OPHTHALMIC | Status: DC
  Administered 2014-07-13 – 2014-07-15 (×5): 1 [drp] via OPHTHALMIC
  Filled 2014-07-13: qty 10

## 2014-07-13 MED ORDER — OMEGA-3-ACID ETHYL ESTERS 1 G PO CAPS
1.0000 g | ORAL_CAPSULE | Freq: Every day | ORAL | Status: DC
  Administered 2014-07-13 – 2014-07-15 (×3): 1 g via ORAL
  Filled 2014-07-13 (×3): qty 1

## 2014-07-13 MED ORDER — WARFARIN SODIUM 6 MG PO TABS
6.0000 mg | ORAL_TABLET | Freq: Once | ORAL | Status: AC
  Administered 2014-07-13: 6 mg via ORAL
  Filled 2014-07-13: qty 1

## 2014-07-13 MED ORDER — MAGNESIUM OXIDE 400 (241.3 MG) MG PO TABS
200.0000 mg | ORAL_TABLET | Freq: Every day | ORAL | Status: DC
  Administered 2014-07-13 – 2014-07-15 (×3): 200 mg via ORAL
  Filled 2014-07-13 (×3): qty 0.5

## 2014-07-13 MED ORDER — ATORVASTATIN CALCIUM 10 MG PO TABS
10.0000 mg | ORAL_TABLET | Freq: Every day | ORAL | Status: DC
Start: 2014-07-13 — End: 2014-07-15
  Administered 2014-07-13 – 2014-07-14 (×2): 10 mg via ORAL
  Filled 2014-07-13 (×3): qty 1

## 2014-07-13 MED ORDER — HEPARIN (PORCINE) IN NACL 100-0.45 UNIT/ML-% IJ SOLN
900.0000 [IU]/h | INTRAMUSCULAR | Status: DC
  Administered 2014-07-13: 900 [IU]/h via INTRAVENOUS
  Filled 2014-07-13: qty 250

## 2014-07-13 MED ORDER — MORPHINE SULFATE 4 MG/ML IJ SOLN
4.0000 mg | Freq: Once | INTRAMUSCULAR | Status: AC
  Administered 2014-07-13: 4 mg via INTRAVENOUS
  Filled 2014-07-13: qty 1

## 2014-07-13 MED ORDER — SODIUM CHLORIDE 0.9 % IJ SOLN
3.0000 mL | Freq: Two times a day (BID) | INTRAMUSCULAR | Status: DC
  Administered 2014-07-13 – 2014-07-15 (×4): 3 mL via INTRAVENOUS

## 2014-07-13 MED ORDER — LATANOPROST 0.005 % OP SOLN
1.0000 [drp] | Freq: Every day | OPHTHALMIC | Status: DC
  Administered 2014-07-13 – 2014-07-14 (×2): 1 [drp] via OPHTHALMIC
  Filled 2014-07-13: qty 2.5

## 2014-07-13 MED ORDER — MAGNESIUM OXIDE 250 MG PO TABS: 1.0000 | ORAL_TABLET | Freq: Every day | ORAL | Status: DC

## 2014-07-13 MED ORDER — LINAGLIPTIN 5 MG PO TABS
5.0000 mg | ORAL_TABLET | Freq: Every day | ORAL | Status: DC
  Administered 2014-07-13 – 2014-07-15 (×3): 5 mg via ORAL
  Filled 2014-07-13 (×3): qty 1

## 2014-07-13 MED ORDER — CARVEDILOL 12.5 MG PO TABS
12.5000 mg | ORAL_TABLET | Freq: Two times a day (BID) | ORAL | Status: DC
Start: 2014-07-13 — End: 2014-07-15
  Administered 2014-07-13 – 2014-07-15 (×4): 12.5 mg via ORAL
  Filled 2014-07-13 (×4): qty 1

## 2014-07-13 MED ORDER — CETYLPYRIDINIUM CHLORIDE 0.05 % MT LIQD
7.0000 mL | Freq: Two times a day (BID) | OROMUCOSAL | Status: DC
  Administered 2014-07-13 – 2014-07-15 (×5): 7 mL via OROMUCOSAL

## 2014-07-13 MED ORDER — INSULIN ASPART 100 UNIT/ML ~~LOC~~ SOLN
0.0000 [IU] | Freq: Three times a day (TID) | SUBCUTANEOUS | Status: DC
  Administered 2014-07-13 (×3): 2 [IU] via SUBCUTANEOUS
  Administered 2014-07-14 (×2): 1 [IU] via SUBCUTANEOUS
  Administered 2014-07-15: 3 [IU] via SUBCUTANEOUS

## 2014-07-13 MED ORDER — LISINOPRIL 40 MG PO TABS
40.0000 mg | ORAL_TABLET | Freq: Every day | ORAL | Status: DC
  Administered 2014-07-13 – 2014-07-15 (×3): 40 mg via ORAL
  Filled 2014-07-13 (×3): qty 1

## 2014-07-13 MED ORDER — GLIPIZIDE ER 10 MG PO TB24
10.0000 mg | ORAL_TABLET | Freq: Every day | ORAL | Status: DC
  Administered 2014-07-13 – 2014-07-15 (×3): 10 mg via ORAL
  Filled 2014-07-13 (×3): qty 1

## 2014-07-13 MED ORDER — CARVEDILOL 25 MG PO TABS: 25.0000 mg | ORAL_TABLET | Freq: Every day | ORAL | Status: DC

## 2014-07-13 NOTE — ED Notes (Signed)
Patient arrives by [facility] with complaints of a fall at 1400 yesterday.  Patient lives by herself and had driven herself to the dermatologist, after returning home, she slipped and fell and injured her back.  Patient stated back pain worsened and now radiating to her right upper and lower quadrants.  Patient received Zofran 4 mg IV by EMS for complaints of nausea and has an IV of #20 angio left ACF.  12 lead shows Atrial fib with RVR 120-patient has a history of atrial fib.  CBG 290 and patient has not taken her Lantus tonight.  Vital signs 164/104 HR 120 O2 sat 96% with DI26

## 2014-07-13 NOTE — ED Notes (Signed)
Pt able to ambulate in hallway with the walker with standby assist without any difficulties. Pt had c/o of soreness in the right back area but was able to bear weight on the right leg. After ambulation p'ts heart rate was in the 130s but decreased down to 110s once settled in the bed. Pt placed back on 2L N/C after ambulation due to O2 sats dropping down to 87%-88%.

## 2014-07-13 NOTE — Care Management Note (Addendum)
    Page 1 of 2   07/15/2014     11:04:35 AM CARE MANAGEMENT NOTE 07/15/2014  Patient:  Destiny [name], Destiny [name]   Account Number:  [id number]  Date Initiated:  07/13/2014  Documentation initiated by:  [facility]  Subjective/Objective Assessment:   79 Y/O F ADMITTED W/R FLANK PAIN.     Action/Plan:   FROM HOME.   Anticipated DC Date:  07/15/2014   Anticipated DC Plan:  [place]  CM consult      Choice offered to / List presented to:  C-1 Patient   DME arranged  [facility]      DME agency  [facility]     HH arranged  HH-2 PT      [place].   Status of service:  Completed, signed off Medicare Important Message given?   (If response is "NO", the following Medicare IM given date fields will be blank) Date Medicare IM given:   Medicare IM given by:   Date Additional Medicare IM given:   Additional Medicare IM given by:    Discharge Disposition:  [place]  Per UR Regulation:  Reviewed for med. necessity/level of care/duration of stay  If discussed at Long Length of Stay Meetings, dates discussed:    Comments:  07/15/14 Eleri Ruben RN,BSN NCM [phone] 10:55A RECEIVED HOME 02 ORDER,& HHPT ORDER.FAXED ALL HOME 02 TO [facility] W/CONFIRMATION.AWAIT DELIVERY OF HOME 02.  AWAITING HOME 02 ORDER-[facility]-WILL FAX ALL INFO-THEY HAVE A 4HR WINDOW FOR DELIVERY TO HOSPITAL. HHPT-[facility] REP AWARE,AWAITING HHPT ORDER.MD/NURSE UPDATED.  07/14/14 Jahnessa Vanduyn RN,BSN NCM [phone] AWAIT PT RECOMMENDATIONS.IF HHPT NEEDED [facility] CHOSEN [facility] AWARE OF POSSIBLE REFERRAL.AWAIT HHPT ORDER.PATIENT ALREADY HAS CANE,RW,TRANSPORT W/C, HOME 02-HAS TRAVEL.  07/13/14 Benjamin Casanas RN,BSN NCM [phone] AWAIT PT RECOMMENDATIONS.

## 2014-07-13 NOTE — Progress Notes (Signed)
Report from [place], [place] in ED.

## 2014-07-13 NOTE — Progress Notes (Signed)
ANTICOAGULATION CONSULT NOTE - Initial Consult  Pharmacy Consult for Coumadin Indication: atrial fibrillation  No Known Allergies  Patient Measurements:    Vital Signs: Temp: 98 F (36.7 C) (10/06 0724) Temp Source: Oral (10/06 0724) BP: 114/70 mmHg (10/06 0724) Pulse Rate: 78 (10/06 0724)  Labs:  Recent Labs  07/13/14 0057 07/13/14 0122  HGB 12.3 13.9  HCT 37.4 41.0  PLT 195  --   LABPROT 17.3*  --   INR 1.40  --   CREATININE 0.43* 0.50  TROPONINI <0.30  --     The CrCl is unknown because both a height and weight (above a minimum accepted value) are required for this calculation.   Medical History: Past Medical History  Diagnosis Date  . Mitral valve insufficiency and aortic valve insufficiency   . Unspecified essential hypertension   . Mononeuritis of unspecified site   . Edema   . Calculus of kidney   . Unspecified glaucoma   . Pure hypercholesterolemia   . Osteoarthrosis, unspecified whether generalized or localized, unspecified site   . Atrial fibrillation 1999-diagnosed  . Diabetes mellitus without mention of complication   . Complication of anesthesia     slow to awaken    Medications:   (Not in a hospital admission)  Assessment: 79yo F on Coumadin for A.fib, admitted after falling at home. She denies hitting her head. She has been off Coumadin x 2 days for a skin biopsy which was done 07/12/14. Pharmacy is asked to resume Coumadin. INR 1.4 on admission. Most recent regimen was 5mg  daily except 2.5mg  on MWF.   Goal of Therapy:  INR 2-3 Monitor platelets by anticoagulation protocol: Yes   Plan:   Give Coumadin 6mg  PO at 10am.  Check PT/INR daily.  Romeo Rabon, PharmD, pager [phone]. 07/13/2014,7:59 AM.

## 2014-07-13 NOTE — ED Notes (Signed)
Bed: LY65 Expected date:  Expected time:  Means of arrival:  Comments: EMS/fall/[place]

## 2014-07-13 NOTE — Progress Notes (Signed)
Pt arrived from ED to room 1402, amb to BR then to bed w/ standby assist and slightly unsteady gait,  Tele 2 confirmed w/ CCMD, pt oriented to callbell and environment, POC discussed w/ pt

## 2014-07-13 NOTE — ED Notes (Addendum)
Patient states that she doesn't remember falling.

## 2014-07-13 NOTE — H&P (Signed)
[facility] History and Physical  Destiny [name] YHC:[id number] DOB: [birth year] DOA: 07/13/2014  Referring physician: ER physician. PCP: Abigail Miyamoto, MD   Chief Complaint: Right flank pain.  HPI: Destiny [name] is a 79 y.o. female with history of atrial fibrillation, diabetes mellitus, hypertension, hyperlipidemia had a fall yesterday at her house after tripping. Patient was trying to lock the door at her porch when she suddenly lost balance and fell. She did hit her head but did not lose consciousness and started experiencing right flank pain. Patient has been off Coumadin for last 2 days for skin biopsy which was done yesterday. Patient denies any chest pain or shortness of breath. In the ER patient had CT abdomen pelvis chest and head and neck all of which did not show anything acute. Initially patient was found to be in A. fib with RVR which improved without any intervention and when pain was controlled. Patient was found in mild hypoxic in the ER. Patient's pain was initially controlled with morphine but pain seems to be coming back at this time. Patient will be admitted for further observation for her right flank pain and mild hypoxia.   Review of Systems: As presented in the history of presenting illness, rest negative.  Past Medical History  Diagnosis Date  . Mitral valve insufficiency and aortic valve insufficiency   . Unspecified essential hypertension   . Mononeuritis of unspecified site   . Edema   . Calculus of kidney   . Unspecified glaucoma   . Pure hypercholesterolemia   . Osteoarthrosis, unspecified whether generalized or localized, unspecified site   . Atrial fibrillation 1999-diagnosed  . Diabetes mellitus without mention of complication   . Complication of anesthesia     slow to awaken   Past Surgical History  Procedure Laterality Date  . Catarct surgery Left   . Joint replacement Right 2009    knee  . Tonsillectomy  1943  . Appendectomy  1954   . Abdominal hysterectomy  2000    partial  . Transesophageal echocardiogram  02/2012  . Holmium laser application Right 02/22/6159    Procedure: HOLMIUM LASER APPLICATION;  Surgeon: Alexis Frock, MD;  Location: [facility];  Service: Urology;  Laterality: Right;  . Cystoscopy w/ retrogrades Right 11/26/2012    Procedure: CYSTOSCOPY WITH RETROGRADE PYELOGRAM;  Surgeon: Alexis Frock, MD;  Location: [facility];  Service: Urology;  Laterality: Right;   Social History:  reports that she quit smoking about 26 years ago. She has never used smokeless tobacco. She reports that she drinks alcohol. Her drug history is not on file. Where does patient live home. Can patient participate in ADLs? Yes.  No Known Allergies  Family History: History reviewed. No pertinent family history.    Prior to Admission medications   Medication Sig Start Date End Date Taking? Authorizing Provider  atorvastatin (LIPITOR) 10 MG tablet Take 10 mg by mouth daily.     Yes Historical Provider, MD  Calcium Carbonate-Vit D-Min 600-400 MG-UNIT TABS Take by mouth daily.     Yes Historical Provider, MD  CARTIA XT 240 MG 24 hr capsule Take 1 capsule by mouth daily. 03/16/13  Yes Historical Provider, MD  carvedilol (COREG) 25 MG tablet Take 25 mg by mouth daily.    Yes Historical Provider, MD  Cinnamon 500 MG capsule Take 500 mg by mouth daily.     Yes Historical Provider, MD  cyanocobalamin 500 MCG tablet Take 500 mcg by mouth daily.  Yes Historical Provider, MD  dorzolamide-timolol (COSOPT) 22.3-6.8 MG/ML ophthalmic solution 1 drop 2 (two) times daily.    Yes Historical Provider, MD  glipiZIDE (GLUCOTROL) 10 MG 24 hr tablet Take 10 mg by mouth daily.     Yes Historical Provider, MD  hydrochlorothiazide (MICROZIDE) 12.5 MG capsule Take 12.5 mg by mouth daily. 03/06/12 07/13/14 Yes Josue Hector, MD  insulin glargine (LANTUS) 100 UNIT/ML injection Inject 16 Units into the skin at bedtime.    Yes  Historical Provider, MD  latanoprost (XALATAN) 0.005 % ophthalmic solution Place 1 drop into both eyes at bedtime.    Yes Historical Provider, MD  linagliptin (TRADJENTA) 5 MG TABS tablet Take 5 mg by mouth daily.   Yes Historical Provider, MD  lisinopril (PRINIVIL,ZESTRIL) 40 MG tablet Take 40 mg by mouth daily.     Yes Historical Provider, MD  Magnesium Oxide 250 MG TABS Take 1 tablet by mouth daily.    Yes Historical Provider, MD  metFORMIN (GLUMETZA) 500 MG (MOD) 24 hr tablet Take 2,000 mg by mouth daily. 4 tabs by  Daily at one time.   Yes Historical Provider, MD  Multiple Vitamins-Minerals (MULTIVITAL) tablet Take 1 tablet by mouth daily.     Yes Historical Provider, MD  Omega-3 Fatty Acids (FISH OIL) 1000 MG CAPS Take 1 capsule by mouth daily.    Yes Historical Provider, MD  vitamin E 400 UNIT capsule Take 400 Units by mouth daily.     Yes Historical Provider, MD  warfarin (COUMADIN) 5 MG tablet Take 2.5-5 mg by mouth daily. Takes 2.5mg  on Monday, Wednesday, Friday. Takes 5mg  on Tuesday, Thursday, Saturday and sunday   Yes Historical Provider, MD  ONE TOUCH ULTRA TEST test strip  10/12/13   Historical Provider, MD    Physical Exam: Filed Vitals:   07/13/14 0522 07/13/14 0524 07/13/14 0533 07/13/14 0539  BP:      Pulse:   115   Temp:      TempSrc:      Resp:      SpO2: 87% 95% 93% 87%     General:  Well-developed and nourished.  Eyes: Anicteric no pallor.  ENT: No discharge from the ears eyes nose mouth.  Neck: No mass felt.  Cardiovascular: S1-S2 heard.  Respiratory: No rhonchi or crepitations.  Abdomen: Soft nontender bowel sounds present.  Skin: No rash. Dressing on the face for skin biopsy.  Musculoskeletal: No edema.  Psychiatric: Appears normal.  Neurologic: Alert awake oriented to time place and person. Moves all extremities.  Labs on Admission:  Basic Metabolic Panel:  Recent Labs Lab 07/13/14 0057 07/13/14 0122  NA 135* 137  K 3.9 3.6*  CL 95* 98   CO2 25  --   GLUCOSE 266* 267*  BUN 12 10  CREATININE 0.43* 0.50  CALCIUM 9.4  --    Liver Function Tests: No results found for this basename: AST, ALT, ALKPHOS, BILITOT, PROT, ALBUMIN,  in the last 168 hours No results found for this basename: LIPASE, AMYLASE,  in the last 168 hours No results found for this basename: AMMONIA,  in the last 168 hours CBC:  Recent Labs Lab 07/13/14 0057 07/13/14 0122  WBC 9.9  --   NEUTROABS 8.5*  --   HGB 12.3 13.9  HCT 37.4 41.0  MCV 93.7  --   PLT 195  --    Cardiac Enzymes:  Recent Labs Lab 07/13/14 0057  TROPONINI <0.30    BNP (last 3 results) No  results found for this basename: PROBNP,  in the last 8760 hours CBG: No results found for this basename: GLUCAP,  in the last 168 hours  Radiological Exams on Admission: Ct Head Wo Contrast  07/13/2014   CLINICAL DATA:  Status post slip and fall yesterday while at home. Does not know whether she hit any object while falling. Hit posterior aspect of head on floor. No loss of consciousness. Concern for head cervical spine injury. Initial encounter.  EXAM: CT HEAD WITHOUT CONTRAST  CT CERVICAL SPINE WITHOUT CONTRAST  TECHNIQUE: Multidetector CT imaging of the head and cervical spine was performed following the standard protocol without intravenous contrast. Multiplanar CT image reconstructions of the cervical spine were also generated.  COMPARISON:  CT of the paranasal sinuses performed 09/07/2005  FINDINGS: CT HEAD FINDINGS  There is no evidence of acute infarction, mass lesion, or intra- or extra-axial hemorrhage on CT.  The posterior fossa, including the cerebellum, brainstem and fourth ventricle, is within normal limits. The third and lateral ventricles, and basal ganglia are unremarkable in appearance. The cerebral hemispheres are symmetric in appearance, with normal gray-white differentiation. No mass effect or midline shift is seen.  There is no evidence of fracture; visualized osseous  structures are unremarkable in appearance. The visualized portions of the orbits are within normal limits. The paranasal sinuses and mastoid air cells are well-aerated. No significant soft tissue abnormalities are seen.  CT CERVICAL SPINE FINDINGS  There is no evidence of fracture or subluxation. Vertebral bodies demonstrate normal height and alignment. Multilevel disc space narrowing is noted along the lower cervical spine, with scattered small anterior and posterior disc osteophyte complexes. Mild underlying facet disease is noted. Prevertebral soft tissues are within normal limits.  A 1.5 cm hypodensity is noted at the left thyroid lobe, with associated calcification. Interstitial prominence is noted at the lung apices. Diffuse vascular calcification is noted along the subclavian arteries bilaterally, and at the carotid bifurcations, with likely at least moderate luminal narrowing at the bifurcations.  IMPRESSION: 1. No evidence of traumatic intracranial injury or fracture. 2. No evidence of fracture or subluxation along the cervical spine. 3. Mild degenerative change noted along the cervical spine. 4. Interstitial prominence at the lung apices. 5. 1.5 cm hypodensity at the left thyroid lobe, with associated calcification. Consider further evaluation with thyroid ultrasound. If patient is clinically hyperthyroid, consider nuclear medicine thyroid uptake and scan. 6. Diffuse vascular calcification along the subclavian arteries bilaterally. 7. Calcification noted at the carotid bifurcations bilaterally, with likely at least moderate bilateral luminal narrowing. Carotid ultrasound would be helpful for further evaluation, when and as deemed clinically appropriate.   Electronically Signed   By: Garald Balding M.D.   On: 07/13/2014 03:05   Ct Chest W Contrast  07/13/2014   CLINICAL DATA:  Slipped and fell at home a. Injury to back. Pain extending to the right upper and lower quadrants. The patient is on Coumadin. No  loss of consciousness.  EXAM: CT CHEST, ABDOMEN, AND PELVIS WITH CONTRAST  TECHNIQUE: Multidetector CT imaging of the chest, abdomen and pelvis was performed following the standard protocol during bolus administration of intravenous contrast.  CONTRAST:  124mL OMNIPAQUE IOHEXOL 300 MG/ML  SOLN  COMPARISON:  CT abdomen and pelvis 01/06/2014.  FINDINGS: CT CHEST FINDINGS  The heart is enlarged. Atherosclerotic calcifications are noted in the aorta and coronary arteries. Sub cm mediastinal lymph nodes are present. There is no significant axillary or mediastinal adenopathy. Small thyroid nodules are present bilaterally.  Dependent atelectasis is present in the lungs bilaterally. There is mild edema. No significant airspace consolidation is present.  Bone windows demonstrate mild degenerative change. No acute abnormalities present.  CT ABDOMEN AND PELVIS FINDINGS  Diffuse fatty infiltration of the liver is again noted. No focal hepatic lesions are present. Spleen is within normal limits. The stomach and duodenum are unremarkable. A 15 mm cystic lesion is again noted within the head of the the pancreas. There is mild dilation of the pancreatic duct and common bile duct. A mobile gallstone is again seen.  The adrenal glands are normal bilaterally. Bilateral renal cystic lesions are stable. Collecting system is normal. Urinary bladder is within normal limits.  The rectosigmoid colon demonstrates mild diverticular changes. The colon extends into a left lateral hernia without obstruction. Diverticular present throughout the transverse and ascending colon is well. The cecum is within normal limits. The appendix is not clearly visualized and may be surgically absent. The small bowel is within normal limits. Atherosclerotic calcifications are present in the aorta and branch vessels.  A left inguinal hernia contains fat. Prominent vessels within the left inguinal canal are compatible of varices. Next item degenerate changes are  again noted within the lower lumbar spine. A superior endplate fracture of L1 is new since prior study.  The patient is status post hysterectomy. The ovaries are not visualized and likely surgically absent.  IMPRESSION: 1. Left lateral ventral hernia includes a portion of the colon without obstruction. 2. Cystic lesion along the head of the pancreas. There may be some interval growth. Recommend MRI without and with contrast for further evaluation. Non-emergent MRI should be deferred until patient has been discharged for the acute illness, and can optimally cooperate with positioning and breath-holding instructions. 3. Extensive atherosclerotic changes. 4. Stable appearance of left inguinal hernia. 5. Probable varices from the left hemiscrotum.   Electronically Signed   By: Lawrence Santiago M.D.   On: 07/13/2014 03:17   Ct Cervical Spine Wo Contrast  07/13/2014   CLINICAL DATA:  Status post slip and fall yesterday while at home. Does not know whether she hit any object while falling. Hit posterior aspect of head on floor. No loss of consciousness. Concern for head cervical spine injury. Initial encounter.  EXAM: CT HEAD WITHOUT CONTRAST  CT CERVICAL SPINE WITHOUT CONTRAST  TECHNIQUE: Multidetector CT imaging of the head and cervical spine was performed following the standard protocol without intravenous contrast. Multiplanar CT image reconstructions of the cervical spine were also generated.  COMPARISON:  CT of the paranasal sinuses performed 09/07/2005  FINDINGS: CT HEAD FINDINGS  There is no evidence of acute infarction, mass lesion, or intra- or extra-axial hemorrhage on CT.  The posterior fossa, including the cerebellum, brainstem and fourth ventricle, is within normal limits. The third and lateral ventricles, and basal ganglia are unremarkable in appearance. The cerebral hemispheres are symmetric in appearance, with normal gray-white differentiation. No mass effect or midline shift is seen.  There is no  evidence of fracture; visualized osseous structures are unremarkable in appearance. The visualized portions of the orbits are within normal limits. The paranasal sinuses and mastoid air cells are well-aerated. No significant soft tissue abnormalities are seen.  CT CERVICAL SPINE FINDINGS  There is no evidence of fracture or subluxation. Vertebral bodies demonstrate normal height and alignment. Multilevel disc space narrowing is noted along the lower cervical spine, with scattered small anterior and posterior disc osteophyte complexes. Mild underlying facet disease is noted. Prevertebral soft tissues are within  normal limits.  A 1.5 cm hypodensity is noted at the left thyroid lobe, with associated calcification. Interstitial prominence is noted at the lung apices. Diffuse vascular calcification is noted along the subclavian arteries bilaterally, and at the carotid bifurcations, with likely at least moderate luminal narrowing at the bifurcations.  IMPRESSION: 1. No evidence of traumatic intracranial injury or fracture. 2. No evidence of fracture or subluxation along the cervical spine. 3. Mild degenerative change noted along the cervical spine. 4. Interstitial prominence at the lung apices. 5. 1.5 cm hypodensity at the left thyroid lobe, with associated calcification. Consider further evaluation with thyroid ultrasound. If patient is clinically hyperthyroid, consider nuclear medicine thyroid uptake and scan. 6. Diffuse vascular calcification along the subclavian arteries bilaterally. 7. Calcification noted at the carotid bifurcations bilaterally, with likely at least moderate bilateral luminal narrowing. Carotid ultrasound would be helpful for further evaluation, when and as deemed clinically appropriate.   Electronically Signed   By: Garald Balding M.D.   On: 07/13/2014 03:05   Ct Abdomen Pelvis W Contrast  07/13/2014   CLINICAL DATA:  Slipped and fell at home a. Injury to back. Pain extending to the right upper  and lower quadrants. The patient is on Coumadin. No loss of consciousness.  EXAM: CT CHEST, ABDOMEN, AND PELVIS WITH CONTRAST  TECHNIQUE: Multidetector CT imaging of the chest, abdomen and pelvis was performed following the standard protocol during bolus administration of intravenous contrast.  CONTRAST:  154mL OMNIPAQUE IOHEXOL 300 MG/ML  SOLN  COMPARISON:  CT abdomen and pelvis 01/06/2014.  FINDINGS: CT CHEST FINDINGS  The heart is enlarged. Atherosclerotic calcifications are noted in the aorta and coronary arteries. Sub cm mediastinal lymph nodes are present. There is no significant axillary or mediastinal adenopathy. Small thyroid nodules are present bilaterally.  Dependent atelectasis is present in the lungs bilaterally. There is mild edema. No significant airspace consolidation is present.  Bone windows demonstrate mild degenerative change. No acute abnormalities present.  CT ABDOMEN AND PELVIS FINDINGS  Diffuse fatty infiltration of the liver is again noted. No focal hepatic lesions are present. Spleen is within normal limits. The stomach and duodenum are unremarkable. A 15 mm cystic lesion is again noted within the head of the the pancreas. There is mild dilation of the pancreatic duct and common bile duct. A mobile gallstone is again seen.  The adrenal glands are normal bilaterally. Bilateral renal cystic lesions are stable. Collecting system is normal. Urinary bladder is within normal limits.  The rectosigmoid colon demonstrates mild diverticular changes. The colon extends into a left lateral hernia without obstruction. Diverticular present throughout the transverse and ascending colon is well. The cecum is within normal limits. The appendix is not clearly visualized and may be surgically absent. The small bowel is within normal limits. Atherosclerotic calcifications are present in the aorta and branch vessels.  A left inguinal hernia contains fat. Prominent vessels within the left inguinal canal are  compatible of varices. Next item degenerate changes are again noted within the lower lumbar spine. A superior endplate fracture of L1 is new since prior study.  The patient is status post hysterectomy. The ovaries are not visualized and likely surgically absent.  IMPRESSION: 1. Left lateral ventral hernia includes a portion of the colon without obstruction. 2. Cystic lesion along the head of the pancreas. There may be some interval growth. Recommend MRI without and with contrast for further evaluation. Non-emergent MRI should be deferred until patient has been discharged for the acute illness, and can  optimally cooperate with positioning and breath-holding instructions. 3. Extensive atherosclerotic changes. 4. Stable appearance of left inguinal hernia. 5. Probable varices from the left hemiscrotum.   Electronically Signed   By: Lawrence Santiago M.D.   On: 07/13/2014 03:17    EKG: Independently reviewed. Atrial fibrillation with RVR.  Assessment/Plan Principal Problem:   Right flank pain Active Problems:   A-fib   Hypoxia   Diabetes mellitus type 2, controlled   1. Right flank pain status post mechanical fall - patient's pain is improved but since to be recurring. We'll keep patient on morphine for pain relief and get physical therapy consult and closely observe. CT scan did not show anything acute. Check CK levels. 2. Mild hypoxia - may be related to patient's pain in the right flank restricting her to take the breath. Closely observe. Check BNP. 3. Atrial fibrillation - initially rate was high and was in RVR but spontaneously went back to normal rate. Patient is on Coreg which will be continued and Coumadin will be dosed per pharmacy. Patient was off Coumadin for last 2 days for skin biopsy. 4. Cystic pancreatic lesion - as per the CAT scan lesion size may have increased in has recommended MRI to be done. I have discussed with patient about this finding and advised that she will need to follow with  her PCP and needs further workup on this. 5. Hypertension - continue present medications. 6. Diabetes mellitus2 - continue present medications. 7. Hyperlipidemia - on statins.    Code Status: Full code.  Family Communication: None.  Disposition Plan: Admit for observation.    Quanita Barona N. [facility] Pager [phone].  If 7PM-7AM, please contact night-coverage [email or web address] Password [email or web address] 07/13/2014, 7:12 AM

## 2014-07-13 NOTE — ED Provider Notes (Addendum)
CSN: [id number]     Arrival date & time 07/13/14  0014 History   First MD Initiated Contact with Patient 07/13/14 0018     Chief Complaint  Patient presents with  . Fall  . Back Pain  . Abdominal Pain     (Consider location/radiation/quality/duration/timing/severity/associated sxs/prior Treatment) HPI 79 year old female presents to emergency apartment via EMS after a fall at 2:00 today at her residence.  Patient reports she was coming in to her house when she fell landing on her right side.  She is unsure exactly how she fell.  Patient was returning from dermatology clinic where she had biopsy done.  She is unsure she struck her head.  She denies any LOC.  Patient is normally on Coumadin for her atrial fibrillation, but has been off for the last 2 days due to having biopsy done.  Patient reports that she attempted to get over the pain on her own at home, the pain worsened with time and she was unable to manage it at home.  Patient complaining of severe nausea, and right flank/abdomen/low back pain. Past Medical History  Diagnosis Date  . Mitral valve insufficiency and aortic valve insufficiency   . Unspecified essential hypertension   . Mononeuritis of unspecified site   . Edema   . Calculus of kidney   . Unspecified glaucoma   . Pure hypercholesterolemia   . Osteoarthrosis, unspecified whether generalized or localized, unspecified site   . Atrial fibrillation 1999-diagnosed  . Diabetes mellitus without mention of complication   . Complication of anesthesia     slow to awaken   Past Surgical History  Procedure Laterality Date  . Catarct surgery Left   . Joint replacement Right 2009    knee  . Tonsillectomy  1943  . Appendectomy  1954  . Abdominal hysterectomy  2000    partial  . Transesophageal echocardiogram  02/2012  . Holmium laser application Right 0/07/2724    Procedure: HOLMIUM LASER APPLICATION;  Surgeon: Alexis Frock, MD;  Location: [facility];   Service: Urology;  Laterality: Right;  . Cystoscopy w/ retrogrades Right 11/26/2012    Procedure: CYSTOSCOPY WITH RETROGRADE PYELOGRAM;  Surgeon: Alexis Frock, MD;  Location: [facility];  Service: Urology;  Laterality: Right;   No family history on file. History  Substance Use Topics  . Smoking status: Former Smoker    Quit date: 07/11/1988  . Smokeless tobacco: Never Used  . Alcohol Use: Yes     Comment: occasional   OB History   Grav Para Term Preterm Abortions TAB SAB Ect Mult Living                 Review of Systems  See History of Present Illness; otherwise all other systems are reviewed and negative   Allergies  Review of patient's allergies indicates no known allergies.  Home Medications   Prior to Admission medications   Medication Sig Start Date End Date Taking? Authorizing Provider  atorvastatin (LIPITOR) 10 MG tablet Take 10 mg by mouth daily.     Yes Historical Provider, MD  Calcium Carbonate-Vit D-Min 600-400 MG-UNIT TABS Take by mouth daily.     Yes Historical Provider, MD  CARTIA XT 240 MG 24 hr capsule Take 1 capsule by mouth daily. 03/16/13  Yes Historical Provider, MD  carvedilol (COREG) 25 MG tablet Take 25 mg by mouth daily.    Yes Historical Provider, MD  Cinnamon 500 MG capsule Take 500 mg by mouth daily.  Yes Historical Provider, MD  cyanocobalamin 500 MCG tablet Take 500 mcg by mouth daily.     Yes Historical Provider, MD  dorzolamide-timolol (COSOPT) 22.3-6.8 MG/ML ophthalmic solution 1 drop 2 (two) times daily.    Yes Historical Provider, MD  insulin glargine (LANTUS) 100 UNIT/ML injection Inject 16 Units into the skin at bedtime.    Yes Historical Provider, MD  metFORMIN (GLUMETZA) 500 MG (MOD) 24 hr tablet Take 2,000 mg by mouth daily. 4 tabs by  Daily at one time.   Yes Historical Provider, MD  vitamin E 400 UNIT capsule Take 400 Units by mouth daily.     Yes Historical Provider, MD  warfarin (COUMADIN) 5 MG tablet Take 2.5-5 mg by  mouth daily. Takes 2.5mg  on Monday, Wednesday, Friday. Takes 5mg  on Tuesday, Thursday, Saturday and sunday   Yes Historical Provider, MD  glipiZIDE (GLUCOTROL) 10 MG 24 hr tablet Take 10 mg by mouth daily.      Historical Provider, MD  hydrochlorothiazide (MICROZIDE) 12.5 MG capsule Take 12.5 mg by mouth daily. 03/06/12 04/20/14  Josue Hector, MD  latanoprost (XALATAN) 0.005 % ophthalmic solution 1 drop daily.      Historical Provider, MD  linagliptin (TRADJENTA) 5 MG TABS tablet Take 5 mg by mouth daily.    Historical Provider, MD  lisinopril (PRINIVIL,ZESTRIL) 40 MG tablet Take 40 mg by mouth daily.      Historical Provider, MD  Magnesium Oxide 250 MG TABS Take by mouth daily.      Historical Provider, MD  Multiple Vitamins-Minerals (MULTIVITAL) tablet Take 1 tablet by mouth daily.      Historical Provider, MD  Omega-3 Fatty Acids (FISH OIL) 1000 MG CAPS Take by mouth daily.      Historical Provider, MD  ONE TOUCH ULTRA TEST test strip  10/12/13   Historical Provider, MD  potassium chloride SA (K-DUR,KLOR-CON) 20 MEQ tablet Take 20 mEq by mouth 2 (two) times daily.      Historical Provider, MD   BP 180/122  Pulse 126  Temp(Src) 97.8 F (36.6 C) (Oral)  Resp 18  SpO2 95% Physical Exam  Nursing note and vitals reviewed. Constitutional: She is oriented to person, place, and time. She appears well-developed and well-nourished. She appears distressed (frail elderly female, appears to be in significant pain.).  HENT:  Head: Normocephalic and atraumatic.  Right Ear: External ear normal.  Left Ear: External ear normal.  Nose: Nose normal.  Mouth/Throat: Oropharynx is clear and moist.  Eyes: Conjunctivae and EOM are normal. Pupils are equal, round, and reactive to light.  Neck: Normal range of motion. Neck supple. No JVD present. No tracheal deviation present. No thyromegaly present.  Cardiovascular: Normal rate, regular rhythm, normal heart sounds and intact distal pulses.  Exam reveals no  gallop and no friction rub.   No murmur heard. Pulmonary/Chest: Effort normal and breath sounds normal. No stridor. No respiratory distress. She has no wheezes. She has no rales. She exhibits no tenderness.  Abdominal: Soft. Bowel sounds are normal. She exhibits no distension and no mass. There is tenderness (patient diffuse tenderness to right flank and right anterior abdomen without rebound or guarding). There is no rebound and no guarding.  Musculoskeletal: Normal range of motion. She exhibits no edema and no tenderness.  Patient has tenderness to right flank, no step-off or crepitus to the back.  Lymphadenopathy:    She has no cervical adenopathy.  Neurological: She is alert and oriented to person, place, and time. She displays  normal reflexes. She exhibits normal muscle tone. Coordination normal.  Skin: Skin is warm and dry. No rash noted. No erythema. No pallor.  Psychiatric: She has a normal mood and affect. Her behavior is normal. Judgment and thought content normal.    ED Course  Procedures (including critical care time) Labs Review Labs Reviewed  CBC WITH DIFFERENTIAL - Abnormal; Notable for the following:    Neutrophils Relative % 85 (*)    Neutro Abs 8.5 (*)    Lymphocytes Relative 9 (*)    All other components within normal limits  BASIC METABOLIC PANEL - Abnormal; Notable for the following:    Sodium 135 (*)    Chloride 95 (*)    Glucose, Bld 266 (*)    Creatinine, Ser 0.43 (*)    All other components within normal limits  PROTIME-INR - Abnormal; Notable for the following:    Prothrombin Time 17.3 (*)    All other components within normal limits  I-STAT CHEM 8, ED - Abnormal; Notable for the following:    Potassium 3.6 (*)    Glucose, Bld 267 (*)    All other components within normal limits  TROPONIN I    Imaging Review Ct Head Wo Contrast  07/13/2014   CLINICAL DATA:  Status post slip and fall yesterday while at home. Does not know whether she hit any object  while falling. Hit posterior aspect of head on floor. No loss of consciousness. Concern for head cervical spine injury. Initial encounter.  EXAM: CT HEAD WITHOUT CONTRAST  CT CERVICAL SPINE WITHOUT CONTRAST  TECHNIQUE: Multidetector CT imaging of the head and cervical spine was performed following the standard protocol without intravenous contrast. Multiplanar CT image reconstructions of the cervical spine were also generated.  COMPARISON:  CT of the paranasal sinuses performed 09/07/2005  FINDINGS: CT HEAD FINDINGS  There is no evidence of acute infarction, mass lesion, or intra- or extra-axial hemorrhage on CT.  The posterior fossa, including the cerebellum, brainstem and fourth ventricle, is within normal limits. The third and lateral ventricles, and basal ganglia are unremarkable in appearance. The cerebral hemispheres are symmetric in appearance, with normal gray-white differentiation. No mass effect or midline shift is seen.  There is no evidence of fracture; visualized osseous structures are unremarkable in appearance. The visualized portions of the orbits are within normal limits. The paranasal sinuses and mastoid air cells are well-aerated. No significant soft tissue abnormalities are seen.  CT CERVICAL SPINE FINDINGS  There is no evidence of fracture or subluxation. Vertebral bodies demonstrate normal height and alignment. Multilevel disc space narrowing is noted along the lower cervical spine, with scattered small anterior and posterior disc osteophyte complexes. Mild underlying facet disease is noted. Prevertebral soft tissues are within normal limits.  A 1.5 cm hypodensity is noted at the left thyroid lobe, with associated calcification. Interstitial prominence is noted at the lung apices. Diffuse vascular calcification is noted along the subclavian arteries bilaterally, and at the carotid bifurcations, with likely at least moderate luminal narrowing at the bifurcations.  IMPRESSION: 1. No evidence of  traumatic intracranial injury or fracture. 2. No evidence of fracture or subluxation along the cervical spine. 3. Mild degenerative change noted along the cervical spine. 4. Interstitial prominence at the lung apices. 5. 1.5 cm hypodensity at the left thyroid lobe, with associated calcification. Consider further evaluation with thyroid ultrasound. If patient is clinically hyperthyroid, consider nuclear medicine thyroid uptake and scan. 6. Diffuse vascular calcification along the subclavian arteries bilaterally. 7. Calcification  noted at the carotid bifurcations bilaterally, with likely at least moderate bilateral luminal narrowing. Carotid ultrasound would be helpful for further evaluation, when and as deemed clinically appropriate.   Electronically Signed   By: Garald Balding M.D.   On: 07/13/2014 03:05   Ct Chest W Contrast  07/13/2014   CLINICAL DATA:  Slipped and fell at home a. Injury to back. Pain extending to the right upper and lower quadrants. The patient is on Coumadin. No loss of consciousness.  EXAM: CT CHEST, ABDOMEN, AND PELVIS WITH CONTRAST  TECHNIQUE: Multidetector CT imaging of the chest, abdomen and pelvis was performed following the standard protocol during bolus administration of intravenous contrast.  CONTRAST:  143mL OMNIPAQUE IOHEXOL 300 MG/ML  SOLN  COMPARISON:  CT abdomen and pelvis 01/06/2014.  FINDINGS: CT CHEST FINDINGS  The heart is enlarged. Atherosclerotic calcifications are noted in the aorta and coronary arteries. Sub cm mediastinal lymph nodes are present. There is no significant axillary or mediastinal adenopathy. Small thyroid nodules are present bilaterally.  Dependent atelectasis is present in the lungs bilaterally. There is mild edema. No significant airspace consolidation is present.  Bone windows demonstrate mild degenerative change. No acute abnormalities present.  CT ABDOMEN AND PELVIS FINDINGS  Diffuse fatty infiltration of the liver is again noted. No focal hepatic  lesions are present. Spleen is within normal limits. The stomach and duodenum are unremarkable. A 15 mm cystic lesion is again noted within the head of the the pancreas. There is mild dilation of the pancreatic duct and common bile duct. A mobile gallstone is again seen.  The adrenal glands are normal bilaterally. Bilateral renal cystic lesions are stable. Collecting system is normal. Urinary bladder is within normal limits.  The rectosigmoid colon demonstrates mild diverticular changes. The colon extends into a left lateral hernia without obstruction. Diverticular present throughout the transverse and ascending colon is well. The cecum is within normal limits. The appendix is not clearly visualized and may be surgically absent. The small bowel is within normal limits. Atherosclerotic calcifications are present in the aorta and branch vessels.  A left inguinal hernia contains fat. Prominent vessels within the left inguinal canal are compatible of varices. Next item degenerate changes are again noted within the lower lumbar spine. A superior endplate fracture of L1 is new since prior study.  The patient is status post hysterectomy. The ovaries are not visualized and likely surgically absent.  IMPRESSION: 1. Left lateral ventral hernia includes a portion of the colon without obstruction. 2. Cystic lesion along the head of the pancreas. There may be some interval growth. Recommend MRI without and with contrast for further evaluation. Non-emergent MRI should be deferred until patient has been discharged for the acute illness, and can optimally cooperate with positioning and breath-holding instructions. 3. Extensive atherosclerotic changes. 4. Stable appearance of left inguinal hernia. 5. Probable varices from the left hemiscrotum.   Electronically Signed   By: Lawrence Santiago M.D.   On: 07/13/2014 03:17   Ct Cervical Spine Wo Contrast  07/13/2014   CLINICAL DATA:  Status post slip and fall yesterday while at home.  Does not know whether she hit any object while falling. Hit posterior aspect of head on floor. No loss of consciousness. Concern for head cervical spine injury. Initial encounter.  EXAM: CT HEAD WITHOUT CONTRAST  CT CERVICAL SPINE WITHOUT CONTRAST  TECHNIQUE: Multidetector CT imaging of the head and cervical spine was performed following the standard protocol without intravenous contrast. Multiplanar CT image reconstructions of  the cervical spine were also generated.  COMPARISON:  CT of the paranasal sinuses performed 09/07/2005  FINDINGS: CT HEAD FINDINGS  There is no evidence of acute infarction, mass lesion, or intra- or extra-axial hemorrhage on CT.  The posterior fossa, including the cerebellum, brainstem and fourth ventricle, is within normal limits. The third and lateral ventricles, and basal ganglia are unremarkable in appearance. The cerebral hemispheres are symmetric in appearance, with normal gray-white differentiation. No mass effect or midline shift is seen.  There is no evidence of fracture; visualized osseous structures are unremarkable in appearance. The visualized portions of the orbits are within normal limits. The paranasal sinuses and mastoid air cells are well-aerated. No significant soft tissue abnormalities are seen.  CT CERVICAL SPINE FINDINGS  There is no evidence of fracture or subluxation. Vertebral bodies demonstrate normal height and alignment. Multilevel disc space narrowing is noted along the lower cervical spine, with scattered small anterior and posterior disc osteophyte complexes. Mild underlying facet disease is noted. Prevertebral soft tissues are within normal limits.  A 1.5 cm hypodensity is noted at the left thyroid lobe, with associated calcification. Interstitial prominence is noted at the lung apices. Diffuse vascular calcification is noted along the subclavian arteries bilaterally, and at the carotid bifurcations, with likely at least moderate luminal narrowing at the  bifurcations.  IMPRESSION: 1. No evidence of traumatic intracranial injury or fracture. 2. No evidence of fracture or subluxation along the cervical spine. 3. Mild degenerative change noted along the cervical spine. 4. Interstitial prominence at the lung apices. 5. 1.5 cm hypodensity at the left thyroid lobe, with associated calcification. Consider further evaluation with thyroid ultrasound. If patient is clinically hyperthyroid, consider nuclear medicine thyroid uptake and scan. 6. Diffuse vascular calcification along the subclavian arteries bilaterally. 7. Calcification noted at the carotid bifurcations bilaterally, with likely at least moderate bilateral luminal narrowing. Carotid ultrasound would be helpful for further evaluation, when and as deemed clinically appropriate.   Electronically Signed   By: Garald Balding M.D.   On: 07/13/2014 03:05   Ct Abdomen Pelvis W Contrast  07/13/2014   CLINICAL DATA:  Slipped and fell at home a. Injury to back. Pain extending to the right upper and lower quadrants. The patient is on Coumadin. No loss of consciousness.  EXAM: CT CHEST, ABDOMEN, AND PELVIS WITH CONTRAST  TECHNIQUE: Multidetector CT imaging of the chest, abdomen and pelvis was performed following the standard protocol during bolus administration of intravenous contrast.  CONTRAST:  160mL OMNIPAQUE IOHEXOL 300 MG/ML  SOLN  COMPARISON:  CT abdomen and pelvis 01/06/2014.  FINDINGS: CT CHEST FINDINGS  The heart is enlarged. Atherosclerotic calcifications are noted in the aorta and coronary arteries. Sub cm mediastinal lymph nodes are present. There is no significant axillary or mediastinal adenopathy. Small thyroid nodules are present bilaterally.  Dependent atelectasis is present in the lungs bilaterally. There is mild edema. No significant airspace consolidation is present.  Bone windows demonstrate mild degenerative change. No acute abnormalities present.  CT ABDOMEN AND PELVIS FINDINGS  Diffuse fatty  infiltration of the liver is again noted. No focal hepatic lesions are present. Spleen is within normal limits. The stomach and duodenum are unremarkable. A 15 mm cystic lesion is again noted within the head of the the pancreas. There is mild dilation of the pancreatic duct and common bile duct. A mobile gallstone is again seen.  The adrenal glands are normal bilaterally. Bilateral renal cystic lesions are stable. Collecting system is normal. Urinary bladder is within  normal limits.  The rectosigmoid colon demonstrates mild diverticular changes. The colon extends into a left lateral hernia without obstruction. Diverticular present throughout the transverse and ascending colon is well. The cecum is within normal limits. The appendix is not clearly visualized and may be surgically absent. The small bowel is within normal limits. Atherosclerotic calcifications are present in the aorta and branch vessels.  A left inguinal hernia contains fat. Prominent vessels within the left inguinal canal are compatible of varices. Next item degenerate changes are again noted within the lower lumbar spine. A superior endplate fracture of L1 is new since prior study.  The patient is status post hysterectomy. The ovaries are not visualized and likely surgically absent.  IMPRESSION: 1. Left lateral ventral hernia includes a portion of the colon without obstruction. 2. Cystic lesion along the head of the pancreas. There may be some interval growth. Recommend MRI without and with contrast for further evaluation. Non-emergent MRI should be deferred until patient has been discharged for the acute illness, and can optimally cooperate with positioning and breath-holding instructions. 3. Extensive atherosclerotic changes. 4. Stable appearance of left inguinal hernia. 5. Probable varices from the left hemiscrotum.   Electronically Signed   By: Lawrence Santiago M.D.   On: 07/13/2014 03:17     EKG Interpretation   Date/Time:  Tuesday July 13 2014 00:15:13 EDT Ventricular Rate:  132 PR Interval:    QRS Duration: 104 QT Interval:  340 QTC Calculation: 504 R Axis:   -69 Text Interpretation:  Atrial fibrillation with rapid ventricular response  Left anterior fascicular block LVH with secondary repolarization  abnormality Anterior Q waves, possibly due to LVH Prolonged QT interval  Confirmed by Sharol Given  MD, Yulanda Diggs ([id number]) on 07/13/2014 12:33:44 AM      MDM   Final diagnoses:  Fall at home, initial encounter  Right flank pain  Hypoxia  Atrial fibrillation with rapid ventricular response    79 year old female status post fall at home.  Patient has history of atrial fibrillation, has rapid ventricular response.  Concerned given her fall and history of Coumadin use that she may have a retroperitoneal hematoma or other significant injury.  Plan for head and C-spine chest abdomen pelvis CT scans.  We'll draw labs.  Patient had significant reduction in her blood pressure and heart rate with pain medication.  Her RVR may be secondary to pain.  We'll continue to give gentle hydration and pain medication.  If need be we'll give her additional beta blocker to see if we can control her rate.  5:51 AM Patient reports her pain is improved but not completely resolved.  Patient ambulated with some pain and difficulty.  Patient with drop in her oxygen saturations into the mid 80s.  She has no history of lung disease.  She does report that she has been short of breath for the last 2 weeks due to increased activity.  No history of COPD emphysema or could just heart failure.  Patient persistently tachycardic.  Will discuss with hospitalist for admission.  I feel the patient may be splinting secondary to pain and not having a good breath.  No specific findings on her CT of chest.  Kalman Drape, MD 07/13/14 [id number]  Kalman Drape, MD 07/13/14 [id number]

## 2014-07-13 NOTE — Progress Notes (Signed)
Pt seen and examined at bedside, hemodynamically and clinically stable. Please see earlier admission note by Dr. Hal Hope.  Brief HPI: 79 y.o. female with history of atrial fibrillation (has been off Coumadin for 2 days due to having skin biopsy), diabetes mellitus, hypertension, hyperlipidemia presented after an episode of fall mechanical yesterday at home while trying to lock the porch door. She reports immediate right flank pain, 4/10 in severity, non radiating, no specific alleviating or aggravating factors. Pt denies chest pain or shortness of breath. In the ER, patient had CT abd, pelvis chest, head, neck all of which did not show anything acute. Initially patient was found to be in A. fib with RVR which improved without any intervention and with pain controlled.  Physical Exam  Constitutional: Appears well-developed and well-nourished. No distress.  Neck: Normal ROM. Neck supple. No JVD.  CVS: RRR, S1/S2 +, no gallops, no carotid bruit.  Pulmonary: Effort and breath sounds normal, rales.   Labs:  CBC Latest Ref Rng 07/13/2014 07/13/2014 07/13/2014  WBC 4.0 - 10.5 K/uL 7.3 - 9.9  Hemoglobin 12.0 - 15.0 g/dL 11.2(L) 13.9 12.3  Hematocrit 36.0 - 46.0 % 34.8(L) 41.0 37.4  Platelets 150 - 400 K/uL 179 - 195   Assessment and Plan:  1. Right flank pain status post mechanical fall - patient's pain is improved, continue analgesia as need, PT evaluation.  2. Acute hypoxic respiratory failure - stable this AM, monitor respiratory status, pt apparently called her cardiologist  3. Atrial fibrillation - initially rate was high and was in RVR but spontaneously went back to normal rate. Patient is on Coreg which will be continued and Coumadin will be dosed per pharmacy. Patient was off Coumadin for last 2 days for skin biopsy. She consulted her cardiologist. Not sure that consult is needed.  4. Cystic pancreatic lesion - as per the CAT scan lesion size may have increased in has recommended MRI to be  done. I have discussed with patient about this finding and advised that she will need to follow with her PCP and needs further workup on this. 5. Hypertension - continue present medications. 6. Diabetes mellitus, 2 - continue present medications. 7. Hyperlipidemia - on statins.  Destiny Ramsay, MD  [facility] Pager [phone]  If 7PM-7AM, please contact night-coverage [email or web address] Password [email or web address]

## 2014-07-13 NOTE — Progress Notes (Signed)
ANTICOAGULATION CONSULT NOTE - Initial Consult  Pharmacy Consult for Heparin & Coumadin Indication: atrial fibrillation  No Known Allergies  Patient Measurements: Height: 5\' 2"  (157.5 cm) Weight: 164 lb 6.4 oz (74.571 kg) IBW/kg (Calculated) : 50.1 Heparin Dosing Weight = 66 kg  Vital Signs: Temp: 98.2 F (36.8 C) (10/06 1346) Temp Source: Oral (10/06 1346) BP: 138/60 mmHg (10/06 1346) Pulse Rate: 95 (10/06 1346)  Labs:  Recent Labs  07/13/14 0057 07/13/14 0122 07/13/14 0840  HGB 12.3 13.9 11.2*  HCT 37.4 41.0 34.8*  PLT 195  --  179  LABPROT 17.3*  --   --   INR 1.40  --   --   CREATININE 0.43* 0.50 0.48*  CKTOTAL  --   --  101  TROPONINI <0.30  --   --     Estimated Creatinine Clearance: 55.7 ml/min (by C-G formula based on Cr of 0.48).   Medical History: Past Medical History  Diagnosis Date  . Mitral valve insufficiency and aortic valve insufficiency   . Unspecified essential hypertension   . Mononeuritis of unspecified site   . Edema   . Calculus of kidney   . Unspecified glaucoma   . Pure hypercholesterolemia   . Osteoarthrosis, unspecified whether generalized or localized, unspecified site   . Atrial fibrillation 1999-diagnosed  . Diabetes mellitus without mention of complication   . Complication of anesthesia     slow to awaken    Medications:  Prescriptions prior to admission  Medication Sig Dispense Refill  . atorvastatin (LIPITOR) 10 MG tablet Take 10 mg by mouth daily.        . Calcium Carbonate-Vit D-Min 600-400 MG-UNIT TABS Take by mouth daily.        Marland Kitchen CARTIA XT 240 MG 24 hr capsule Take 1 capsule by mouth daily.      . carvedilol (COREG) 25 MG tablet Take 25 mg by mouth daily.       . Cinnamon 500 MG capsule Take 500 mg by mouth daily.        . cyanocobalamin 500 MCG tablet Take 500 mcg by mouth daily.        . dorzolamide-timolol (COSOPT) 22.3-6.8 MG/ML ophthalmic solution 1 drop 2 (two) times daily.       Marland Kitchen glipiZIDE (GLUCOTROL) 10  MG 24 hr tablet Take 10 mg by mouth daily.        . hydrochlorothiazide (MICROZIDE) 12.5 MG capsule Take 12.5 mg by mouth daily.      . insulin glargine (LANTUS) 100 UNIT/ML injection Inject 16 Units into the skin at bedtime.       Marland Kitchen latanoprost (XALATAN) 0.005 % ophthalmic solution Place 1 drop into both eyes at bedtime.       Marland Kitchen linagliptin (TRADJENTA) 5 MG TABS tablet Take 5 mg by mouth daily.      Marland Kitchen lisinopril (PRINIVIL,ZESTRIL) 40 MG tablet Take 40 mg by mouth daily.        . Magnesium Oxide 250 MG TABS Take 1 tablet by mouth daily.       . metFORMIN (GLUMETZA) 500 MG (MOD) 24 hr tablet Take 2,000 mg by mouth daily. 4 tabs by  Daily at one time.      . Multiple Vitamins-Minerals (MULTIVITAL) tablet Take 1 tablet by mouth daily.        . Omega-3 Fatty Acids (FISH OIL) 1000 MG CAPS Take 1 capsule by mouth daily.       . vitamin E 400 UNIT capsule Take  400 Units by mouth daily.        Marland Kitchen warfarin (COUMADIN) 5 MG tablet Take 2.5-5 mg by mouth daily. Takes 2.5mg  on Monday, Wednesday, Friday. Takes 5mg  on Tuesday, Thursday, Saturday and sunday      . ONE TOUCH ULTRA TEST test strip         Assessment: 79yo F on Coumadin for A.fib, admitted after falling at home. She denies hitting her head. She has been off Coumadin x 2 days for a skin biopsy which was done 07/12/14. Pharmacy is asked to resume Coumadin. INR 1.4 on admission. Most recent regimen was 5mg  daily except 2.5mg  on MWF.   PM Update:  Cardiology consulted and has requested pharmacy to also dose IV heparin until INR is therapeutic  Goal of Therapy:  Heparin level 0.3-0.7 INR 2-3 Monitor platelets by anticoagulation protocol: Yes   Plan:   No Heparin Bolus  Begin IV heparin drip @ 900 units/hr  Check heparin level 8 hr after heparin started  Coumadin dose was given this AM  Check PT/INR daily.  Check daily heparin level & CBC while on heparin  Leone Haven, PharmD . 07/13/2014,6:16 PM.

## 2014-07-13 NOTE — Consult Note (Signed)
CARDIOLOGY CONSULT NOTE       Patient ID: Destiny [name] MRN: [id number] DOB/AGE: 12-Mar-1935 79 y.o.  Admit date: 07/13/2014 Referring Physician:  Doyle Askew Primary Physician: Abigail Miyamoto, MD Primary Cardiologist:  Johnsie Cancel Reason for Consultation:  Afib and hypxia  Principal Problem:   Right flank pain Active Problems:   A-fib   Hypoxia   Diabetes mellitus type 2, controlled   HPI:   79 yo with chronic afib  History of TIA on chronci coumadin for anticoagulation.  Echo earlier this year with normal EF moderate LVH and mild AS.  She had stopped her anticoagulation to have  A left temporal skin biopsy.  Subsequently tripped and fell.  Seen in ER with back pain and nausea persisting.  Multiple CT scans with no broken bones or hematoma.  INR subRx.  No chest pain.  Mild dyspnea CXR with mild fluid overload, atelectasis and BNP elevated.  No chest pain.  Afib rate elevated with pain but now improved.  Has been OOB to bathroom but activity limited by back pain.  Getting Zofran for nausea No cough sputum or fever    ROS All other systems reviewed and negative except as noted above  Past Medical History  Diagnosis Date  . Mitral valve insufficiency and aortic valve insufficiency   . Unspecified essential hypertension   . Mononeuritis of unspecified site   . Edema   . Calculus of kidney   . Unspecified glaucoma   . Pure hypercholesterolemia   . Osteoarthrosis, unspecified whether generalized or localized, unspecified site   . Atrial fibrillation 1999-diagnosed  . Diabetes mellitus without mention of complication   . Complication of anesthesia     slow to awaken    History reviewed. No pertinent family history.  History   Social History  . Marital Status: Widowed    Spouse Name: N/A    Number of Children: N/A  . Years of Education: N/A   Occupational History  . Not on file.   Social History Main Topics  . Smoking status: Former Smoker    Quit date: 07/11/1988  .  Smokeless tobacco: Never Used  . Alcohol Use: Yes     Comment: occasional  . Drug Use: Not on file  . Sexual Activity: No   Other Topics Concern  . Not on file   Social History Narrative   Widow. Lives alone. Does have a caregiver lined up after surgery. Lives in one story home. Has living will.     Past Surgical History  Procedure Laterality Date  . Catarct surgery Left   . Joint replacement Right 2009    knee  . Tonsillectomy  1943  . Appendectomy  1954  . Abdominal hysterectomy  2000    partial  . Transesophageal echocardiogram  02/2012  . Holmium laser application Right 5/00/9381    Procedure: HOLMIUM LASER APPLICATION;  Surgeon: Alexis Frock, MD;  Location: [facility];  Service: Urology;  Laterality: Right;  . Cystoscopy w/ retrogrades Right 11/26/2012    Procedure: CYSTOSCOPY WITH RETROGRADE PYELOGRAM;  Surgeon: Alexis Frock, MD;  Location: [facility];  Service: Urology;  Laterality: Right;     . antiseptic oral rinse  7 mL Mouth Rinse BID  . atorvastatin  10 mg Oral q1800  . [START ON 07/14/2014] carvedilol  25 mg Oral Daily  . [START ON 07/14/2014] cyanocobalamin  500 mcg Oral Daily  . dorzolamide-timolol  1 drop Both Eyes BID  . glipiZIDE  10  mg Oral Q breakfast  . hydrochlorothiazide  12.5 mg Oral Daily  . insulin aspart  0-9 Units Subcutaneous TID WC  . insulin glargine  16 Units Subcutaneous QHS  . latanoprost  1 drop Both Eyes QHS  . linagliptin  5 mg Oral Daily  . lisinopril  40 mg Oral Daily  . magnesium oxide  200 mg Oral Daily  . multivitamin with minerals  1 tablet Oral Daily  . omega-3 acid ethyl esters  1 g Oral Daily  . sodium chloride  3 mL Intravenous Q12H  . sodium chloride  3 mL Intravenous Q12H  . vitamin E  400 Units Oral Daily  . Warfarin - Pharmacist Dosing Inpatient   Does not apply q1800      Physical Exam: Blood pressure 138/60, pulse 95, temperature 98.2 F (36.8 C), temperature source Oral, resp.  rate 16, height 5\' 2"  (1.575 m), weight 164 lb 6.4 oz (74.571 kg), SpO2 97.00%.    Affect appropriate Elderly female  HEENT: normal Neck supple with no adenopathy JVP normal no bruits no thyromegaly Lungs clear with no wheezing and good diaphragmatic motion Heart:  S1/S2 SEM murmur, no rub, gallop or click PMI normal Abdomen: benighn, BS positve, no tenderness, no AAA no bruit.  No HSM or HJR Distal pulses intact with no bruits Trace  Edema with LE varicosities  Neuro non-focal Skin warm and dry No muscular weakness   Labs:   Lab Results  Component Value Date   WBC 7.3 07/13/2014   HGB 11.2* 07/13/2014   HCT 34.8* 07/13/2014   MCV 93.5 07/13/2014   PLT 179 07/13/2014    Recent Labs Lab 07/13/14 0840  NA 138  K 4.1  CL 102  CO2 25  BUN 10  CREATININE 0.48*  CALCIUM 8.5  PROT 6.6  BILITOT 0.7  ALKPHOS 35*  ALT 15  AST 23  GLUCOSE 185*   Lab Results  Component Value Date   CKTOTAL 101 07/13/2014   TROPONINI <0.30 07/13/2014       Radiology: Ct Head Wo Contrast  07/13/2014   CLINICAL DATA:  Status post slip and fall yesterday while at home. Does not know whether she hit any object while falling. Hit posterior aspect of head on floor. No loss of consciousness. Concern for head cervical spine injury. Initial encounter.  EXAM: CT HEAD WITHOUT CONTRAST  CT CERVICAL SPINE WITHOUT CONTRAST  TECHNIQUE: Multidetector CT imaging of the head and cervical spine was performed following the standard protocol without intravenous contrast. Multiplanar CT image reconstructions of the cervical spine were also generated.  COMPARISON:  CT of the paranasal sinuses performed 09/07/2005  FINDINGS: CT HEAD FINDINGS  There is no evidence of acute infarction, mass lesion, or intra- or extra-axial hemorrhage on CT.  The posterior fossa, including the cerebellum, brainstem and fourth ventricle, is within normal limits. The third and lateral ventricles, and basal ganglia are unremarkable in  appearance. The cerebral hemispheres are symmetric in appearance, with normal gray-white differentiation. No mass effect or midline shift is seen.  There is no evidence of fracture; visualized osseous structures are unremarkable in appearance. The visualized portions of the orbits are within normal limits. The paranasal sinuses and mastoid air cells are well-aerated. No significant soft tissue abnormalities are seen.  CT CERVICAL SPINE FINDINGS  There is no evidence of fracture or subluxation. Vertebral bodies demonstrate normal height and alignment. Multilevel disc space narrowing is noted along the lower cervical spine, with scattered small anterior and posterior  disc osteophyte complexes. Mild underlying facet disease is noted. Prevertebral soft tissues are within normal limits.  A 1.5 cm hypodensity is noted at the left thyroid lobe, with associated calcification. Interstitial prominence is noted at the lung apices. Diffuse vascular calcification is noted along the subclavian arteries bilaterally, and at the carotid bifurcations, with likely at least moderate luminal narrowing at the bifurcations.  IMPRESSION: 1. No evidence of traumatic intracranial injury or fracture. 2. No evidence of fracture or subluxation along the cervical spine. 3. Mild degenerative change noted along the cervical spine. 4. Interstitial prominence at the lung apices. 5. 1.5 cm hypodensity at the left thyroid lobe, with associated calcification. Consider further evaluation with thyroid ultrasound. If patient is clinically hyperthyroid, consider nuclear medicine thyroid uptake and scan. 6. Diffuse vascular calcification along the subclavian arteries bilaterally. 7. Calcification noted at the carotid bifurcations bilaterally, with likely at least moderate bilateral luminal narrowing. Carotid ultrasound would be helpful for further evaluation, when and as deemed clinically appropriate.   Electronically Signed   By: Garald Balding M.D.   On:  07/13/2014 03:05   Ct Chest W Contrast  07/13/2014   CLINICAL DATA:  Slipped and fell at home a. Injury to back. Pain extending to the right upper and lower quadrants. The patient is on Coumadin. No loss of consciousness.  EXAM: CT CHEST, ABDOMEN, AND PELVIS WITH CONTRAST  TECHNIQUE: Multidetector CT imaging of the chest, abdomen and pelvis was performed following the standard protocol during bolus administration of intravenous contrast.  CONTRAST:  19mL OMNIPAQUE IOHEXOL 300 MG/ML  SOLN  COMPARISON:  CT abdomen and pelvis 01/06/2014.  FINDINGS: CT CHEST FINDINGS  The heart is enlarged. Atherosclerotic calcifications are noted in the aorta and coronary arteries. Sub cm mediastinal lymph nodes are present. There is no significant axillary or mediastinal adenopathy. Small thyroid nodules are present bilaterally.  Dependent atelectasis is present in the lungs bilaterally. There is mild edema. No significant airspace consolidation is present.  Bone windows demonstrate mild degenerative change. No acute abnormalities present.  CT ABDOMEN AND PELVIS FINDINGS  Diffuse fatty infiltration of the liver is again noted. No focal hepatic lesions are present. Spleen is within normal limits. The stomach and duodenum are unremarkable. A 15 mm cystic lesion is again noted within the head of the the pancreas. There is mild dilation of the pancreatic duct and common bile duct. A mobile gallstone is again seen.  The adrenal glands are normal bilaterally. Bilateral renal cystic lesions are stable. Collecting system is normal. Urinary bladder is within normal limits.  The rectosigmoid colon demonstrates mild diverticular changes. The colon extends into a left lateral hernia without obstruction. Diverticular present throughout the transverse and ascending colon is well. The cecum is within normal limits. The appendix is not clearly visualized and may be surgically absent. The small bowel is within normal limits. Atherosclerotic  calcifications are present in the aorta and branch vessels.  A left inguinal hernia contains fat. Prominent vessels within the left inguinal canal are compatible of varices. Next item degenerate changes are again noted within the lower lumbar spine. A superior endplate fracture of L1 is new since prior study.  The patient is status post hysterectomy. The ovaries are not visualized and likely surgically absent.  IMPRESSION: 1. Left lateral ventral hernia includes a portion of the colon without obstruction. 2. Cystic lesion along the head of the pancreas. There may be some interval growth. Recommend MRI without and with contrast for further evaluation. Non-emergent MRI should  be deferred until patient has been discharged for the acute illness, and can optimally cooperate with positioning and breath-holding instructions. 3. Extensive atherosclerotic changes. 4. Stable appearance of left inguinal hernia. 5. Probable varices from the left hemiscrotum.   Electronically Signed   By: Lawrence Santiago M.D.   On: 07/13/2014 03:17   Ct Cervical Spine Wo Contrast  07/13/2014   CLINICAL DATA:  Status post slip and fall yesterday while at home. Does not know whether she hit any object while falling. Hit posterior aspect of head on floor. No loss of consciousness. Concern for head cervical spine injury. Initial encounter.  EXAM: CT HEAD WITHOUT CONTRAST  CT CERVICAL SPINE WITHOUT CONTRAST  TECHNIQUE: Multidetector CT imaging of the head and cervical spine was performed following the standard protocol without intravenous contrast. Multiplanar CT image reconstructions of the cervical spine were also generated.  COMPARISON:  CT of the paranasal sinuses performed 09/07/2005  FINDINGS: CT HEAD FINDINGS  There is no evidence of acute infarction, mass lesion, or intra- or extra-axial hemorrhage on CT.  The posterior fossa, including the cerebellum, brainstem and fourth ventricle, is within normal limits. The third and lateral  ventricles, and basal ganglia are unremarkable in appearance. The cerebral hemispheres are symmetric in appearance, with normal gray-white differentiation. No mass effect or midline shift is seen.  There is no evidence of fracture; visualized osseous structures are unremarkable in appearance. The visualized portions of the orbits are within normal limits. The paranasal sinuses and mastoid air cells are well-aerated. No significant soft tissue abnormalities are seen.  CT CERVICAL SPINE FINDINGS  There is no evidence of fracture or subluxation. Vertebral bodies demonstrate normal height and alignment. Multilevel disc space narrowing is noted along the lower cervical spine, with scattered small anterior and posterior disc osteophyte complexes. Mild underlying facet disease is noted. Prevertebral soft tissues are within normal limits.  A 1.5 cm hypodensity is noted at the left thyroid lobe, with associated calcification. Interstitial prominence is noted at the lung apices. Diffuse vascular calcification is noted along the subclavian arteries bilaterally, and at the carotid bifurcations, with likely at least moderate luminal narrowing at the bifurcations.  IMPRESSION: 1. No evidence of traumatic intracranial injury or fracture. 2. No evidence of fracture or subluxation along the cervical spine. 3. Mild degenerative change noted along the cervical spine. 4. Interstitial prominence at the lung apices. 5. 1.5 cm hypodensity at the left thyroid lobe, with associated calcification. Consider further evaluation with thyroid ultrasound. If patient is clinically hyperthyroid, consider nuclear medicine thyroid uptake and scan. 6. Diffuse vascular calcification along the subclavian arteries bilaterally. 7. Calcification noted at the carotid bifurcations bilaterally, with likely at least moderate bilateral luminal narrowing. Carotid ultrasound would be helpful for further evaluation, when and as deemed clinically appropriate.    Electronically Signed   By: Garald Balding M.D.   On: 07/13/2014 03:05   Ct Abdomen Pelvis W Contrast  07/13/2014   CLINICAL DATA:  Slipped and fell at home a. Injury to back. Pain extending to the right upper and lower quadrants. The patient is on Coumadin. No loss of consciousness.  EXAM: CT CHEST, ABDOMEN, AND PELVIS WITH CONTRAST  TECHNIQUE: Multidetector CT imaging of the chest, abdomen and pelvis was performed following the standard protocol during bolus administration of intravenous contrast.  CONTRAST:  163mL OMNIPAQUE IOHEXOL 300 MG/ML  SOLN  COMPARISON:  CT abdomen and pelvis 01/06/2014.  FINDINGS: CT CHEST FINDINGS  The heart is enlarged. Atherosclerotic calcifications are noted in  the aorta and coronary arteries. Sub cm mediastinal lymph nodes are present. There is no significant axillary or mediastinal adenopathy. Small thyroid nodules are present bilaterally.  Dependent atelectasis is present in the lungs bilaterally. There is mild edema. No significant airspace consolidation is present.  Bone windows demonstrate mild degenerative change. No acute abnormalities present.  CT ABDOMEN AND PELVIS FINDINGS  Diffuse fatty infiltration of the liver is again noted. No focal hepatic lesions are present. Spleen is within normal limits. The stomach and duodenum are unremarkable. A 15 mm cystic lesion is again noted within the head of the the pancreas. There is mild dilation of the pancreatic duct and common bile duct. A mobile gallstone is again seen.  The adrenal glands are normal bilaterally. Bilateral renal cystic lesions are stable. Collecting system is normal. Urinary bladder is within normal limits.  The rectosigmoid colon demonstrates mild diverticular changes. The colon extends into a left lateral hernia without obstruction. Diverticular present throughout the transverse and ascending colon is well. The cecum is within normal limits. The appendix is not clearly visualized and may be surgically absent.  The small bowel is within normal limits. Atherosclerotic calcifications are present in the aorta and branch vessels.  A left inguinal hernia contains fat. Prominent vessels within the left inguinal canal are compatible of varices. Next item degenerate changes are again noted within the lower lumbar spine. A superior endplate fracture of L1 is new since prior study.  The patient is status post hysterectomy. The ovaries are not visualized and likely surgically absent.  IMPRESSION: 1. Left lateral ventral hernia includes a portion of the colon without obstruction. 2. Cystic lesion along the head of the pancreas. There may be some interval growth. Recommend MRI without and with contrast for further evaluation. Non-emergent MRI should be deferred until patient has been discharged for the acute illness, and can optimally cooperate with positioning and breath-holding instructions. 3. Extensive atherosclerotic changes. 4. Stable appearance of left inguinal hernia. 5. Probable varices from the left hemiscrotum.   Electronically Signed   By: Lawrence Santiago M.D.   On: 07/13/2014 03:17    EKG:  Afib LVH rate 132 LAFB no acute ST changes  Telemetry:  Now afib better rate control 80-95   ASSESSMENT AND PLAN:  Afib:  Start heparin while in the bed and subRx on coumadin has had TIA in past.  Change coreg to more traditional bid dosing same total mg  Check INR in am Diastolic CHF:  One dose of lasix tonight continue HCTZ  Incentive spirometry HTN:  Stable continue ACE DM:  Continue SS coverage while in hospital Back Pain:  Limit narcotic pain meds given nausea   Nausea:  Abdomen soft with BS  No pathology on acute pathology on CT  Advance diet as tolerated Zofran   Signed: Jenkins Rouge 07/13/2014, 5:44 PM

## 2014-07-14 ENCOUNTER — Observation Stay (HOSPITAL_COMMUNITY): Payer: Medicare HMO

## 2014-07-14 DIAGNOSIS — I4891 Unspecified atrial fibrillation: Secondary | ICD-10-CM

## 2014-07-14 LAB — BASIC METABOLIC PANEL
Anion gap: 10 (ref 5–15)
BUN: 10 mg/dL (ref 6–23)
CO2: 29 mEq/L (ref 19–32)
Calcium: 8.8 mg/dL (ref 8.4–10.5)
Chloride: 100 mEq/L (ref 96–112)
Creatinine, Ser: 0.51 mg/dL (ref 0.50–1.10)
GFR calc Af Amer: >90 mL/min (ref >90)
GFR calc non Af Amer: >90 mL/min (ref >90)
GLUCOSE: 174 mg/dL — AB (ref 70–99)
POTASSIUM: 3.6 meq/L — AB (ref 3.7–5.3)
Sodium: 139 mEq/L (ref 137–147)

## 2014-07-14 LAB — CBC
HCT: 32.9 % — ABNORMAL LOW (ref 36.0–46.0)
HEMOGLOBIN: 10.7 g/dL — AB (ref 12.0–15.0)
MCH: 30.7 pg (ref 26.0–34.0)
MCHC: 32.5 g/dL (ref 30.0–36.0)
MCV: 94.5 fL (ref 78.0–100.0)
Platelets: 158 10*3/uL (ref 150–400)
RBC: 3.48 MIL/uL — ABNORMAL LOW (ref 3.87–5.11)
RDW: 14.6 % (ref 11.5–15.5)
WBC: 8.4 10*3/uL (ref 4.0–10.5)

## 2014-07-14 LAB — GLUCOSE, CAPILLARY
GLUCOSE-CAPILLARY: 112 mg/dL — AB (ref 70–99)
Glucose-Capillary: 121 mg/dL — ABNORMAL HIGH (ref 70–99)
Glucose-Capillary: 153 mg/dL — ABNORMAL HIGH (ref 70–99)
Glucose-Capillary: 209 mg/dL — ABNORMAL HIGH (ref 70–99)

## 2014-07-14 LAB — HEPARIN LEVEL (UNFRACTIONATED): HEPARIN UNFRACTIONATED: 0.12 [IU]/mL — AB (ref 0.30–0.70)

## 2014-07-14 LAB — PROTIME-INR
INR: 1.5 — ABNORMAL HIGH (ref 0.00–1.49)
PROTHROMBIN TIME: 18.2 s — AB (ref 11.6–15.2)

## 2014-07-14 MED ORDER — HEPARIN (PORCINE) IN NACL 100-0.45 UNIT/ML-% IJ SOLN
1050.0000 [IU]/h | INTRAMUSCULAR | Status: DC
  Filled 2014-07-14: qty 250

## 2014-07-14 MED ORDER — WARFARIN SODIUM 6 MG PO TABS
6.0000 mg | ORAL_TABLET | Freq: Once | ORAL | Status: AC
  Administered 2014-07-14: 6 mg via ORAL
  Filled 2014-07-14: qty 1

## 2014-07-14 MED ORDER — BISACODYL 10 MG RE SUPP
10.0000 mg | Freq: Once | RECTAL | Status: AC
  Administered 2014-07-14: 10 mg via RECTAL
  Filled 2014-07-14 (×2): qty 1

## 2014-07-14 MED ORDER — BISACODYL 10 MG RE SUPP: 10.0000 mg | Freq: Every day | RECTAL | Status: DC | PRN

## 2014-07-14 MED ORDER — FUROSEMIDE 10 MG/ML IJ SOLN
20.0000 mg | Freq: Two times a day (BID) | INTRAMUSCULAR | Status: DC
  Administered 2014-07-14 – 2014-07-15 (×3): 20 mg via INTRAVENOUS
  Filled 2014-07-14 (×3): qty 2

## 2014-07-14 MED ORDER — HYDROCODONE-ACETAMINOPHEN 5-325 MG PO TABS
2.0000 | ORAL_TABLET | Freq: Four times a day (QID) | ORAL | Status: DC | PRN
  Administered 2014-07-14 – 2014-07-15 (×4): 2 via ORAL
  Filled 2014-07-14 (×4): qty 2

## 2014-07-14 NOTE — Progress Notes (Signed)
ANTICOAGULATION CONSULT NOTE - Follow Up Consult  Pharmacy Consult for Heparin Indication: atrial fibrillation  No Known Allergies  Patient Measurements: Height: 5\' 2"  (157.5 cm) Weight: 164 lb 6.4 oz (74.571 kg) IBW/kg (Calculated) : 50.1 Heparin Dosing Weight:   Vital Signs: Temp: 98.7 F (37.1 C) (10/06 2108) Temp Source: Oral (10/06 2108) BP: 137/71 mmHg (10/06 2108) Pulse Rate: 92 (10/06 2108)  Labs:  Recent Labs  07/13/14 0057 07/13/14 0122 07/13/14 0840 07/14/14 0235  HGB 12.3 13.9 11.2* 10.7*  HCT 37.4 41.0 34.8* 32.9*  PLT 195  --  179 158  LABPROT 17.3*  --   --  18.2*  INR 1.40  --   --  1.50*  HEPARINUNFRC  --   --   --  0.12*  CREATININE 0.43* 0.50 0.48* 0.51  CKTOTAL  --   --  101  --   TROPONINI <0.30  --   --   --     Estimated Creatinine Clearance: 55.7 ml/min (by C-G formula based on Cr of 0.51).   Medications:  Infusions:  . heparin 1,050 Units/hr (07/14/14 0421)    Assessment: Patient with low heparin level.  No issues per RN.  Goal of Therapy:  Heparin level 0.3-0.7 units/ml Monitor platelets by anticoagulation protocol: Yes   Plan:  Increase heparin to 1050 units/hr Recheck level at [place] 07/14/2014,5:48 AM

## 2014-07-14 NOTE — Progress Notes (Addendum)
[facility] PROGRESS NOTE  Destiny [name] HYI:[id number] DOB: 12-05-1934 DOA: 07/13/2014 PCP: Abigail Miyamoto, MD  Assessment/Plan: 1. ? Pulmonary edema- Patient became hypoxic after taking off oxygen. She has elevated BNP  2973. Will obtain chest x-ray PA and lateral 2 views and start Lasix 20 g IV every 12 hours. 2.  Right flank pain status post mechanical fall- patient's excess did not show significant abnormality. Continues to require pain medication. She was evaluated by physical therapy and it with home health PT. 3. Atrial fibrillation- patient came her initial was high And was in rapid ventricular response but rate is back to normal. Patient is currently on Coumadin along with coreg. Heart rate is controlled continue anticoagulation. 4. Cystic pancreatic lesion- patient has CT scan of the abdominal showed cystic lesion along the head of the pancreas. Recommend MRI without and with contrast as outpatient. 5. Hypertension- blood pressure is controlled, continue antihypertensive regimen 6. Diabetes mellitus- continue sliding scale insulin and Lantus 16 units at bedtime. Blood glucose is well controlled.  Code Status: Full code Family Communication: *Discussed with family friend at bedside Disposition Plan: Home when stable   Consultants:  Cardiology  Procedures:  None  Antibiotics:  *None  HPI/Subjective: 79 y.o. female with history of atrial fibrillation (has been off Coumadin for 2 days due to having skin biopsy), diabetes mellitus, hypertension, hyperlipidemia presented after an episode of fall mechanical yesterday at home while trying to lock the porch door. She reports immediate right flank pain, 4/10 in severity, non radiating, no specific alleviating or aggravating factors. Pt denies chest pain or shortness of breath. In the ER, patient had CT abd, pelvis chest, head, neck all of which did not show anything acute. Initially patient was found to be in A. fib with  RVR which improved without any intervention and with pain controlled  Patient feeling better this morning, her o2 sats dropped to 82 after taking off oxygen, came right back up to 93% on 2 L   Objective: Filed Vitals:   07/14/14 0941  BP: 155/76  Pulse:   Temp:   Resp:     Intake/Output Summary (Last 24 hours) at 07/14/14 1407 Last data filed at 07/14/14 0600  Gross per 24 hour  Intake  98.78 ml  Output      0 ml  Net  98.78 ml   Filed Weights   07/13/14 0802 07/14/14 0557  Weight: 74.571 kg (164 lb 6.4 oz) 73.936 kg (163 lb)    Exam:  Physical Exam: Head: Normocephalic, atraumatic.  Neck: supple,No deformities, masses, or tenderness noted. Lungs: Normal respiratory effort. Bibasilar rales Heart: Regular RR. S1 and S2 normal  Abdomen: BS normoactive. Soft, Nondistended, non-tender.  Extremities: No pretibial edema, no erythema   Data Reviewed: Basic Metabolic Panel:  Recent Labs Lab 07/13/14 0057 07/13/14 0122 07/13/14 0840 07/14/14 0235  NA 135* 137 138 139  K 3.9 3.6* 4.1 3.6*  CL 95* 98 102 100  CO2 25  --  25 29  GLUCOSE 266* 267* 185* 174*  BUN 12 10 10 10   CREATININE 0.43* 0.50 0.48* 0.51  CALCIUM 9.4  --  8.5 8.8   Liver Function Tests:  Recent Labs Lab 07/13/14 0840  AST 23  ALT 15  ALKPHOS 35*  BILITOT 0.7  PROT 6.6  ALBUMIN 3.3*   No results found for this basename: LIPASE, AMYLASE,  in the last 168 hours No results found for this basename: AMMONIA,  in the last 168 hours  CBC:  Recent Labs Lab 07/13/14 0057 07/13/14 0122 07/13/14 0840 07/14/14 0235  WBC 9.9  --  7.3 8.4  NEUTROABS 8.5*  --  5.4  --   HGB 12.3 13.9 11.2* 10.7*  HCT 37.4 41.0 34.8* 32.9*  MCV 93.7  --  93.5 94.5  PLT 195  --  179 158   Cardiac Enzymes:  Recent Labs Lab 07/13/14 0057 07/13/14 0840  CKTOTAL  --  101  TROPONINI <0.30  --    BNP (last 3 results)  Recent Labs  07/13/14 0840  PROBNP 2973.0*   CBG:  Recent Labs Lab 07/13/14 1243  07/13/14 1654 07/13/14 2205 07/14/14 0751 07/14/14 1200  GLUCAP 176* 170* 156* 121* 153*    No results found for this or any previous visit (from the past 240 hour(s)).   Studies: Ct Head Wo Contrast  07/13/2014   CLINICAL DATA:  Status post slip and fall yesterday while at home. Does not know whether she hit any object while falling. Hit posterior aspect of head on floor. No loss of consciousness. Concern for head cervical spine injury. Initial encounter.  EXAM: CT HEAD WITHOUT CONTRAST  CT CERVICAL SPINE WITHOUT CONTRAST  TECHNIQUE: Multidetector CT imaging of the head and cervical spine was performed following the standard protocol without intravenous contrast. Multiplanar CT image reconstructions of the cervical spine were also generated.  COMPARISON:  CT of the paranasal sinuses performed 09/07/2005  FINDINGS: CT HEAD FINDINGS  There is no evidence of acute infarction, mass lesion, or intra- or extra-axial hemorrhage on CT.  The posterior fossa, including the cerebellum, brainstem and fourth ventricle, is within normal limits. The third and lateral ventricles, and basal ganglia are unremarkable in appearance. The cerebral hemispheres are symmetric in appearance, with normal gray-white differentiation. No mass effect or midline shift is seen.  There is no evidence of fracture; visualized osseous structures are unremarkable in appearance. The visualized portions of the orbits are within normal limits. The paranasal sinuses and mastoid air cells are well-aerated. No significant soft tissue abnormalities are seen.  CT CERVICAL SPINE FINDINGS  There is no evidence of fracture or subluxation. Vertebral bodies demonstrate normal height and alignment. Multilevel disc space narrowing is noted along the lower cervical spine, with scattered small anterior and posterior disc osteophyte complexes. Mild underlying facet disease is noted. Prevertebral soft tissues are within normal limits.  A 1.5 cm hypodensity is  noted at the left thyroid lobe, with associated calcification. Interstitial prominence is noted at the lung apices. Diffuse vascular calcification is noted along the subclavian arteries bilaterally, and at the carotid bifurcations, with likely at least moderate luminal narrowing at the bifurcations.  IMPRESSION: 1. No evidence of traumatic intracranial injury or fracture. 2. No evidence of fracture or subluxation along the cervical spine. 3. Mild degenerative change noted along the cervical spine. 4. Interstitial prominence at the lung apices. 5. 1.5 cm hypodensity at the left thyroid lobe, with associated calcification. Consider further evaluation with thyroid ultrasound. If patient is clinically hyperthyroid, consider nuclear medicine thyroid uptake and scan. 6. Diffuse vascular calcification along the subclavian arteries bilaterally. 7. Calcification noted at the carotid bifurcations bilaterally, with likely at least moderate bilateral luminal narrowing. Carotid ultrasound would be helpful for further evaluation, when and as deemed clinically appropriate.   Electronically Signed   By: Garald Balding M.D.   On: 07/13/2014 03:05   Ct Chest W Contrast  07/13/2014   CLINICAL DATA:  Slipped and fell at home a. Injury to  back. Pain extending to the right upper and lower quadrants. The patient is on Coumadin. No loss of consciousness.  EXAM: CT CHEST, ABDOMEN, AND PELVIS WITH CONTRAST  TECHNIQUE: Multidetector CT imaging of the chest, abdomen and pelvis was performed following the standard protocol during bolus administration of intravenous contrast.  CONTRAST:  171mL OMNIPAQUE IOHEXOL 300 MG/ML  SOLN  COMPARISON:  CT abdomen and pelvis 01/06/2014.  FINDINGS: CT CHEST FINDINGS  The heart is enlarged. Atherosclerotic calcifications are noted in the aorta and coronary arteries. Sub cm mediastinal lymph nodes are present. There is no significant axillary or mediastinal adenopathy. Small thyroid nodules are present  bilaterally.  Dependent atelectasis is present in the lungs bilaterally. There is mild edema. No significant airspace consolidation is present.  Bone windows demonstrate mild degenerative change. No acute abnormalities present.  CT ABDOMEN AND PELVIS FINDINGS  Diffuse fatty infiltration of the liver is again noted. No focal hepatic lesions are present. Spleen is within normal limits. The stomach and duodenum are unremarkable. A 15 mm cystic lesion is again noted within the head of the the pancreas. There is mild dilation of the pancreatic duct and common bile duct. A mobile gallstone is again seen.  The adrenal glands are normal bilaterally. Bilateral renal cystic lesions are stable. Collecting system is normal. Urinary bladder is within normal limits.  The rectosigmoid colon demonstrates mild diverticular changes. The colon extends into a left lateral hernia without obstruction. Diverticular present throughout the transverse and ascending colon is well. The cecum is within normal limits. The appendix is not clearly visualized and may be surgically absent. The small bowel is within normal limits. Atherosclerotic calcifications are present in the aorta and branch vessels.  A left inguinal hernia contains fat. Prominent vessels within the left inguinal canal are compatible of varices. Next item degenerate changes are again noted within the lower lumbar spine. A superior endplate fracture of L1 is new since prior study.  The patient is status post hysterectomy. The ovaries are not visualized and likely surgically absent.  IMPRESSION: 1. Left lateral ventral hernia includes a portion of the colon without obstruction. 2. Cystic lesion along the head of the pancreas. There may be some interval growth. Recommend MRI without and with contrast for further evaluation. Non-emergent MRI should be deferred until patient has been discharged for the acute illness, and can optimally cooperate with positioning and breath-holding  instructions. 3. Extensive atherosclerotic changes. 4. Stable appearance of left inguinal hernia. 5. Probable varices from the left hemiscrotum.   Electronically Signed   By: Lawrence Santiago M.D.   On: 07/13/2014 03:17   Ct Cervical Spine Wo Contrast  07/13/2014   CLINICAL DATA:  Status post slip and fall yesterday while at home. Does not know whether she hit any object while falling. Hit posterior aspect of head on floor. No loss of consciousness. Concern for head cervical spine injury. Initial encounter.  EXAM: CT HEAD WITHOUT CONTRAST  CT CERVICAL SPINE WITHOUT CONTRAST  TECHNIQUE: Multidetector CT imaging of the head and cervical spine was performed following the standard protocol without intravenous contrast. Multiplanar CT image reconstructions of the cervical spine were also generated.  COMPARISON:  CT of the paranasal sinuses performed 09/07/2005  FINDINGS: CT HEAD FINDINGS  There is no evidence of acute infarction, mass lesion, or intra- or extra-axial hemorrhage on CT.  The posterior fossa, including the cerebellum, brainstem and fourth ventricle, is within normal limits. The third and lateral ventricles, and basal ganglia are unremarkable in appearance.  The cerebral hemispheres are symmetric in appearance, with normal gray-white differentiation. No mass effect or midline shift is seen.  There is no evidence of fracture; visualized osseous structures are unremarkable in appearance. The visualized portions of the orbits are within normal limits. The paranasal sinuses and mastoid air cells are well-aerated. No significant soft tissue abnormalities are seen.  CT CERVICAL SPINE FINDINGS  There is no evidence of fracture or subluxation. Vertebral bodies demonstrate normal height and alignment. Multilevel disc space narrowing is noted along the lower cervical spine, with scattered small anterior and posterior disc osteophyte complexes. Mild underlying facet disease is noted. Prevertebral soft tissues are  within normal limits.  A 1.5 cm hypodensity is noted at the left thyroid lobe, with associated calcification. Interstitial prominence is noted at the lung apices. Diffuse vascular calcification is noted along the subclavian arteries bilaterally, and at the carotid bifurcations, with likely at least moderate luminal narrowing at the bifurcations.  IMPRESSION: 1. No evidence of traumatic intracranial injury or fracture. 2. No evidence of fracture or subluxation along the cervical spine. 3. Mild degenerative change noted along the cervical spine. 4. Interstitial prominence at the lung apices. 5. 1.5 cm hypodensity at the left thyroid lobe, with associated calcification. Consider further evaluation with thyroid ultrasound. If patient is clinically hyperthyroid, consider nuclear medicine thyroid uptake and scan. 6. Diffuse vascular calcification along the subclavian arteries bilaterally. 7. Calcification noted at the carotid bifurcations bilaterally, with likely at least moderate bilateral luminal narrowing. Carotid ultrasound would be helpful for further evaluation, when and as deemed clinically appropriate.   Electronically Signed   By: Garald Balding M.D.   On: 07/13/2014 03:05   Ct Abdomen Pelvis W Contrast  07/13/2014   CLINICAL DATA:  Slipped and fell at home a. Injury to back. Pain extending to the right upper and lower quadrants. The patient is on Coumadin. No loss of consciousness.  EXAM: CT CHEST, ABDOMEN, AND PELVIS WITH CONTRAST  TECHNIQUE: Multidetector CT imaging of the chest, abdomen and pelvis was performed following the standard protocol during bolus administration of intravenous contrast.  CONTRAST:  139mL OMNIPAQUE IOHEXOL 300 MG/ML  SOLN  COMPARISON:  CT abdomen and pelvis 01/06/2014.  FINDINGS: CT CHEST FINDINGS  The heart is enlarged. Atherosclerotic calcifications are noted in the aorta and coronary arteries. Sub cm mediastinal lymph nodes are present. There is no significant axillary or  mediastinal adenopathy. Small thyroid nodules are present bilaterally.  Dependent atelectasis is present in the lungs bilaterally. There is mild edema. No significant airspace consolidation is present.  Bone windows demonstrate mild degenerative change. No acute abnormalities present.  CT ABDOMEN AND PELVIS FINDINGS  Diffuse fatty infiltration of the liver is again noted. No focal hepatic lesions are present. Spleen is within normal limits. The stomach and duodenum are unremarkable. A 15 mm cystic lesion is again noted within the head of the the pancreas. There is mild dilation of the pancreatic duct and common bile duct. A mobile gallstone is again seen.  The adrenal glands are normal bilaterally. Bilateral renal cystic lesions are stable. Collecting system is normal. Urinary bladder is within normal limits.  The rectosigmoid colon demonstrates mild diverticular changes. The colon extends into a left lateral hernia without obstruction. Diverticular present throughout the transverse and ascending colon is well. The cecum is within normal limits. The appendix is not clearly visualized and may be surgically absent. The small bowel is within normal limits. Atherosclerotic calcifications are present in the aorta and branch vessels.  A left inguinal hernia contains fat. Prominent vessels within the left inguinal canal are compatible of varices. Next item degenerate changes are again noted within the lower lumbar spine. A superior endplate fracture of L1 is new since prior study.  The patient is status post hysterectomy. The ovaries are not visualized and likely surgically absent.  IMPRESSION: 1. Left lateral ventral hernia includes a portion of the colon without obstruction. 2. Cystic lesion along the head of the pancreas. There may be some interval growth. Recommend MRI without and with contrast for further evaluation. Non-emergent MRI should be deferred until patient has been discharged for the acute illness, and can  optimally cooperate with positioning and breath-holding instructions. 3. Extensive atherosclerotic changes. 4. Stable appearance of left inguinal hernia. 5. Probable varices from the left hemiscrotum.   Electronically Signed   By: Lawrence Santiago M.D.   On: 07/13/2014 03:17    Scheduled Meds: . antiseptic oral rinse  7 mL Mouth Rinse BID  . atorvastatin  10 mg Oral q1800  . carvedilol  12.5 mg Oral BID WC  . cyanocobalamin  500 mcg Oral Daily  . dorzolamide-timolol  1 drop Both Eyes BID  . furosemide  20 mg Intravenous Q12H  . glipiZIDE  10 mg Oral Q breakfast  . hydrochlorothiazide  12.5 mg Oral Daily  . insulin aspart  0-9 Units Subcutaneous TID WC  . insulin glargine  16 Units Subcutaneous QHS  . latanoprost  1 drop Both Eyes QHS  . linagliptin  5 mg Oral Daily  . lisinopril  40 mg Oral Daily  . magnesium oxide  200 mg Oral Daily  . multivitamin with minerals  1 tablet Oral Daily  . omega-3 acid ethyl esters  1 g Oral Daily  . sodium chloride  3 mL Intravenous Q12H  . sodium chloride  3 mL Intravenous Q12H  . vitamin E  400 Units Oral Daily  . Warfarin - Pharmacist Dosing Inpatient   Does not apply q1800   Continuous Infusions:   Principal Problem:   Right flank pain Active Problems:   A-fib   Hypoxia   Diabetes mellitus type 2, controlled    Time spent: 35 min    [facility]  [facility] Pager [phone]*. If 7PM-7AM, please contact night-coverage at [email or web address], password [facility] 07/14/2014, 2:07 PM  LOS: 1 day

## 2014-07-14 NOTE — Progress Notes (Signed)
Pt with c/o feeling very sleepy "I just can't keep my eyes open". O2 sat checked on room air 82%. VS otherwise stable. O2 placed back on 2l [place] with O2 sats increased to 93%. Pt with O2 replaced more alert "I feel better". MD via phone given update. Will continue to moniter Pt closely for any other acute changes.

## 2014-07-14 NOTE — Progress Notes (Signed)
Patient ID: Destiny [name], female   DOB: May 26, 1935, 79 y.o.   MRN: [id number]    Subjective:  Denies SSCP, palpitations or Dyspnea Nausea better   Objective:  Filed Vitals:   07/13/14 0824 07/13/14 1346 07/13/14 2108 07/14/14 0557  BP: 124/73 138/60 137/71 153/72  Pulse: 98 95 92 91  Temp: 98.2 F (36.8 C) 98.2 F (36.8 C) 98.7 F (37.1 C) 98.1 F (36.7 C)  TempSrc: Oral Oral Oral Oral  Resp:   18 18  Height:      Weight:    163 lb (73.936 kg)  SpO2: 99% 97% 98% 98%    Intake/Output from previous day:  Intake/Output Summary (Last 24 hours) at 07/14/14 0912 Last data filed at 07/14/14 0600  Gross per 24 hour  Intake 338.78 ml  Output      0 ml  Net 338.78 ml    Physical Exam: Affect appropriate Chronically ill white female  HEENT: normal Neck supple with no adenopathy JVP normal no bruits no thyromegaly Lungs clear with no wheezing and good diaphragmatic motion Heart:  S1/S2 SEM  murmur, no rub, gallop or click PMI normal Abdomen: benighn, BS positve, no tenderness, no AAA no bruit.  No HSM or HJR Distal pulses intact with no bruits No edema Neuro non-focal Skin warm and dry No muscular weakness   Lab Results: Basic Metabolic Panel:  Recent Labs  07/13/14 0840 07/14/14 0235  NA 138 139  K 4.1 3.6*  CL 102 100  CO2 25 29  GLUCOSE 185* 174*  BUN 10 10  CREATININE 0.48* 0.51  CALCIUM 8.5 8.8   Liver Function Tests:  Recent Labs  07/13/14 0840  AST 23  ALT 15  ALKPHOS 35*  BILITOT 0.7  PROT 6.6  ALBUMIN 3.3*   No results found for this basename: LIPASE, AMYLASE,  in the last 72 hours CBC:  Recent Labs  07/13/14 0057  07/13/14 0840 07/14/14 0235  WBC 9.9  --  7.3 8.4  NEUTROABS 8.5*  --  5.4  --   HGB 12.3  < > 11.2* 10.7*  HCT 37.4  < > 34.8* 32.9*  MCV 93.7  --  93.5 94.5  PLT 195  --  179 158  < > = values in this interval not displayed. Cardiac Enzymes:  Recent Labs  07/13/14 0057 07/13/14 0840  CKTOTAL  --  101    TROPONINI <0.30  --     Imaging: Ct Head Wo Contrast  07/13/2014   CLINICAL DATA:  Status post slip and fall yesterday while at home. Does not know whether she hit any object while falling. Hit posterior aspect of head on floor. No loss of consciousness. Concern for head cervical spine injury. Initial encounter.  EXAM: CT HEAD WITHOUT CONTRAST  CT CERVICAL SPINE WITHOUT CONTRAST  TECHNIQUE: Multidetector CT imaging of the head and cervical spine was performed following the standard protocol without intravenous contrast. Multiplanar CT image reconstructions of the cervical spine were also generated.  COMPARISON:  CT of the paranasal sinuses performed 09/07/2005  FINDINGS: CT HEAD FINDINGS  There is no evidence of acute infarction, mass lesion, or intra- or extra-axial hemorrhage on CT.  The posterior fossa, including the cerebellum, brainstem and fourth ventricle, is within normal limits. The third and lateral ventricles, and basal ganglia are unremarkable in appearance. The cerebral hemispheres are symmetric in appearance, with normal gray-white differentiation. No mass effect or midline shift is seen.  There is no evidence of fracture; visualized  osseous structures are unremarkable in appearance. The visualized portions of the orbits are within normal limits. The paranasal sinuses and mastoid air cells are well-aerated. No significant soft tissue abnormalities are seen.  CT CERVICAL SPINE FINDINGS  There is no evidence of fracture or subluxation. Vertebral bodies demonstrate normal height and alignment. Multilevel disc space narrowing is noted along the lower cervical spine, with scattered small anterior and posterior disc osteophyte complexes. Mild underlying facet disease is noted. Prevertebral soft tissues are within normal limits.  A 1.5 cm hypodensity is noted at the left thyroid lobe, with associated calcification. Interstitial prominence is noted at the lung apices. Diffuse vascular calcification is  noted along the subclavian arteries bilaterally, and at the carotid bifurcations, with likely at least moderate luminal narrowing at the bifurcations.  IMPRESSION: 1. No evidence of traumatic intracranial injury or fracture. 2. No evidence of fracture or subluxation along the cervical spine. 3. Mild degenerative change noted along the cervical spine. 4. Interstitial prominence at the lung apices. 5. 1.5 cm hypodensity at the left thyroid lobe, with associated calcification. Consider further evaluation with thyroid ultrasound. If patient is clinically hyperthyroid, consider nuclear medicine thyroid uptake and scan. 6. Diffuse vascular calcification along the subclavian arteries bilaterally. 7. Calcification noted at the carotid bifurcations bilaterally, with likely at least moderate bilateral luminal narrowing. Carotid ultrasound would be helpful for further evaluation, when and as deemed clinically appropriate.   Electronically Signed   By: Garald Balding M.D.   On: 07/13/2014 03:05   Ct Chest W Contrast  07/13/2014   CLINICAL DATA:  Slipped and fell at home a. Injury to back. Pain extending to the right upper and lower quadrants. The patient is on Coumadin. No loss of consciousness.  EXAM: CT CHEST, ABDOMEN, AND PELVIS WITH CONTRAST  TECHNIQUE: Multidetector CT imaging of the chest, abdomen and pelvis was performed following the standard protocol during bolus administration of intravenous contrast.  CONTRAST:  187mL OMNIPAQUE IOHEXOL 300 MG/ML  SOLN  COMPARISON:  CT abdomen and pelvis 01/06/2014.  FINDINGS: CT CHEST FINDINGS  The heart is enlarged. Atherosclerotic calcifications are noted in the aorta and coronary arteries. Sub cm mediastinal lymph nodes are present. There is no significant axillary or mediastinal adenopathy. Small thyroid nodules are present bilaterally.  Dependent atelectasis is present in the lungs bilaterally. There is mild edema. No significant airspace consolidation is present.  Bone  windows demonstrate mild degenerative change. No acute abnormalities present.  CT ABDOMEN AND PELVIS FINDINGS  Diffuse fatty infiltration of the liver is again noted. No focal hepatic lesions are present. Spleen is within normal limits. The stomach and duodenum are unremarkable. A 15 mm cystic lesion is again noted within the head of the the pancreas. There is mild dilation of the pancreatic duct and common bile duct. A mobile gallstone is again seen.  The adrenal glands are normal bilaterally. Bilateral renal cystic lesions are stable. Collecting system is normal. Urinary bladder is within normal limits.  The rectosigmoid colon demonstrates mild diverticular changes. The colon extends into a left lateral hernia without obstruction. Diverticular present throughout the transverse and ascending colon is well. The cecum is within normal limits. The appendix is not clearly visualized and may be surgically absent. The small bowel is within normal limits. Atherosclerotic calcifications are present in the aorta and branch vessels.  A left inguinal hernia contains fat. Prominent vessels within the left inguinal canal are compatible of varices. Next item degenerate changes are again noted within the lower lumbar  spine. A superior endplate fracture of L1 is new since prior study.  The patient is status post hysterectomy. The ovaries are not visualized and likely surgically absent.  IMPRESSION: 1. Left lateral ventral hernia includes a portion of the colon without obstruction. 2. Cystic lesion along the head of the pancreas. There may be some interval growth. Recommend MRI without and with contrast for further evaluation. Non-emergent MRI should be deferred until patient has been discharged for the acute illness, and can optimally cooperate with positioning and breath-holding instructions. 3. Extensive atherosclerotic changes. 4. Stable appearance of left inguinal hernia. 5. Probable varices from the left hemiscrotum.    Electronically Signed   By: Lawrence Santiago M.D.   On: 07/13/2014 03:17   Ct Cervical Spine Wo Contrast  07/13/2014   CLINICAL DATA:  Status post slip and fall yesterday while at home. Does not know whether she hit any object while falling. Hit posterior aspect of head on floor. No loss of consciousness. Concern for head cervical spine injury. Initial encounter.  EXAM: CT HEAD WITHOUT CONTRAST  CT CERVICAL SPINE WITHOUT CONTRAST  TECHNIQUE: Multidetector CT imaging of the head and cervical spine was performed following the standard protocol without intravenous contrast. Multiplanar CT image reconstructions of the cervical spine were also generated.  COMPARISON:  CT of the paranasal sinuses performed 09/07/2005  FINDINGS: CT HEAD FINDINGS  There is no evidence of acute infarction, mass lesion, or intra- or extra-axial hemorrhage on CT.  The posterior fossa, including the cerebellum, brainstem and fourth ventricle, is within normal limits. The third and lateral ventricles, and basal ganglia are unremarkable in appearance. The cerebral hemispheres are symmetric in appearance, with normal gray-white differentiation. No mass effect or midline shift is seen.  There is no evidence of fracture; visualized osseous structures are unremarkable in appearance. The visualized portions of the orbits are within normal limits. The paranasal sinuses and mastoid air cells are well-aerated. No significant soft tissue abnormalities are seen.  CT CERVICAL SPINE FINDINGS  There is no evidence of fracture or subluxation. Vertebral bodies demonstrate normal height and alignment. Multilevel disc space narrowing is noted along the lower cervical spine, with scattered small anterior and posterior disc osteophyte complexes. Mild underlying facet disease is noted. Prevertebral soft tissues are within normal limits.  A 1.5 cm hypodensity is noted at the left thyroid lobe, with associated calcification. Interstitial prominence is noted at the  lung apices. Diffuse vascular calcification is noted along the subclavian arteries bilaterally, and at the carotid bifurcations, with likely at least moderate luminal narrowing at the bifurcations.  IMPRESSION: 1. No evidence of traumatic intracranial injury or fracture. 2. No evidence of fracture or subluxation along the cervical spine. 3. Mild degenerative change noted along the cervical spine. 4. Interstitial prominence at the lung apices. 5. 1.5 cm hypodensity at the left thyroid lobe, with associated calcification. Consider further evaluation with thyroid ultrasound. If patient is clinically hyperthyroid, consider nuclear medicine thyroid uptake and scan. 6. Diffuse vascular calcification along the subclavian arteries bilaterally. 7. Calcification noted at the carotid bifurcations bilaterally, with likely at least moderate bilateral luminal narrowing. Carotid ultrasound would be helpful for further evaluation, when and as deemed clinically appropriate.   Electronically Signed   By: Garald Balding M.D.   On: 07/13/2014 03:05   Ct Abdomen Pelvis W Contrast  07/13/2014   CLINICAL DATA:  Slipped and fell at home a. Injury to back. Pain extending to the right upper and lower quadrants. The patient is on  Coumadin. No loss of consciousness.  EXAM: CT CHEST, ABDOMEN, AND PELVIS WITH CONTRAST  TECHNIQUE: Multidetector CT imaging of the chest, abdomen and pelvis was performed following the standard protocol during bolus administration of intravenous contrast.  CONTRAST:  136mL OMNIPAQUE IOHEXOL 300 MG/ML  SOLN  COMPARISON:  CT abdomen and pelvis 01/06/2014.  FINDINGS: CT CHEST FINDINGS  The heart is enlarged. Atherosclerotic calcifications are noted in the aorta and coronary arteries. Sub cm mediastinal lymph nodes are present. There is no significant axillary or mediastinal adenopathy. Small thyroid nodules are present bilaterally.  Dependent atelectasis is present in the lungs bilaterally. There is mild edema. No  significant airspace consolidation is present.  Bone windows demonstrate mild degenerative change. No acute abnormalities present.  CT ABDOMEN AND PELVIS FINDINGS  Diffuse fatty infiltration of the liver is again noted. No focal hepatic lesions are present. Spleen is within normal limits. The stomach and duodenum are unremarkable. A 15 mm cystic lesion is again noted within the head of the the pancreas. There is mild dilation of the pancreatic duct and common bile duct. A mobile gallstone is again seen.  The adrenal glands are normal bilaterally. Bilateral renal cystic lesions are stable. Collecting system is normal. Urinary bladder is within normal limits.  The rectosigmoid colon demonstrates mild diverticular changes. The colon extends into a left lateral hernia without obstruction. Diverticular present throughout the transverse and ascending colon is well. The cecum is within normal limits. The appendix is not clearly visualized and may be surgically absent. The small bowel is within normal limits. Atherosclerotic calcifications are present in the aorta and branch vessels.  A left inguinal hernia contains fat. Prominent vessels within the left inguinal canal are compatible of varices. Next item degenerate changes are again noted within the lower lumbar spine. A superior endplate fracture of L1 is new since prior study.  The patient is status post hysterectomy. The ovaries are not visualized and likely surgically absent.  IMPRESSION: 1. Left lateral ventral hernia includes a portion of the colon without obstruction. 2. Cystic lesion along the head of the pancreas. There may be some interval growth. Recommend MRI without and with contrast for further evaluation. Non-emergent MRI should be deferred until patient has been discharged for the acute illness, and can optimally cooperate with positioning and breath-holding instructions. 3. Extensive atherosclerotic changes. 4. Stable appearance of left inguinal hernia. 5.  Probable varices from the left hemiscrotum.   Electronically Signed   By: Lawrence Santiago M.D.   On: 07/13/2014 03:17    Cardiac Studies:  ECG:  afib no acute ST changes   Telemetry: afib with good rate control 75-95  Echo:   Medications:   . antiseptic oral rinse  7 mL Mouth Rinse BID  . atorvastatin  10 mg Oral q1800  . bisacodyl  10 mg Rectal Once  . carvedilol  12.5 mg Oral BID WC  . cyanocobalamin  500 mcg Oral Daily  . dorzolamide-timolol  1 drop Both Eyes BID  . glipiZIDE  10 mg Oral Q breakfast  . hydrochlorothiazide  12.5 mg Oral Daily  . insulin aspart  0-9 Units Subcutaneous TID WC  . insulin glargine  16 Units Subcutaneous QHS  . latanoprost  1 drop Both Eyes QHS  . linagliptin  5 mg Oral Daily  . lisinopril  40 mg Oral Daily  . magnesium oxide  200 mg Oral Daily  . multivitamin with minerals  1 tablet Oral Daily  . omega-3 acid ethyl esters  1 g Oral Daily  . sodium chloride  3 mL Intravenous Q12H  . sodium chloride  3 mL Intravenous Q12H  . vitamin E  400 Units Oral Daily  . Warfarin - Pharmacist Dosing Inpatient   Does not apply q1800     . heparin 1,050 Units/hr (07/14/14 0421)    Assessment/Plan:  Afib:  Resume coumadin  F/U INR with Dr Maceo Pro next week Derm:  Post left temporal lesion removal sutures out next week Fall:  CT and Xrays with no fractures or hematoma  Improved Nausea:  No signs of ileus CT abdomen negative  Taking PO   Diastolic CHF:  Resolved d/c with home dose of HCTZ    Jenkins Rouge 07/14/2014, 9:12 AM

## 2014-07-14 NOTE — Progress Notes (Signed)
ANTICOAGULATION CONSULT NOTE - Initial Consult  Pharmacy Consult for Heparin & Coumadin Indication: atrial fibrillation  Assessment: 79yo F on Coumadin for A.fib, admitted after falling at home. She denies hitting her head. At time of admission she had been off Coumadin x 2 days for a skin biopsy which was done 07/12/14, now will resume.  INR 1.4 on admission.  Home regimen 5mg  daily except 2.5mg  on MWF.  Cardiology consulted and has requested pharmacy to also dose IV heparin until INR is therapeutic  10/7: d/c heparin per cardiology; no additional overlapping desired.  Intent was for d/c today, but patient remains unstable   Hgb decreased since admit by ~3 g.  Did receive 2L NS on 10/6 and appears to be relatively stable since then.  Plt also decreased in similar manner, though still WNL  INR still subtherapeutic, although increased slightly  Eating 50% of meals  Goal of Therapy:  INR 2-3 Monitor platelets by anticoagulation protocol: Yes   Plan:   D/c heparin drip  Give Coumadin 6 mg PO again today at 1800  Check PT/INR daily  CBC q72hr while on coumadin  ----------------------------------------------------------------------- Heparin rates/levels 10/6: initiated at 900 units/hr 10/7: HL 0.12; increased to 1050 units/hr  Coumadin dosing/INR 10/6: INR 1.4 on admission; given 6 mg coumadin 10/7: INR 1.5    No Known Allergies  Patient Measurements: Height: 5\' 2"  (157.5 cm) Weight: 163 lb (73.936 kg) IBW/kg (Calculated) : 50.1 Heparin Dosing Weight = 66 kg  Vital Signs: Temp: 98.1 F (36.7 C) (10/07 0557) Temp Source: Oral (10/07 0557) BP: 153/72 mmHg (10/07 0557) Pulse Rate: 91 (10/07 0557)  Labs:  Recent Labs  07/13/14 0057 07/13/14 0122 07/13/14 0840 07/14/14 0235  HGB 12.3 13.9 11.2* 10.7*  HCT 37.4 41.0 34.8* 32.9*  PLT 195  --  179 158  LABPROT 17.3*  --   --  18.2*  INR 1.40  --   --  1.50*  HEPARINUNFRC  --   --   --  0.12*  CREATININE  0.43* 0.50 0.48* 0.51  CKTOTAL  --   --  101  --   TROPONINI <0.30  --   --   --     Estimated Creatinine Clearance: 55.4 ml/min (by C-G formula based on Cr of 0.51).   Medical History: Past Medical History  Diagnosis Date  . Mitral valve insufficiency and aortic valve insufficiency   . Unspecified essential hypertension   . Mononeuritis of unspecified site   . Edema   . Calculus of kidney   . Unspecified glaucoma   . Pure hypercholesterolemia   . Osteoarthrosis, unspecified whether generalized or localized, unspecified site   . Atrial fibrillation 1999-diagnosed  . Diabetes mellitus without mention of complication   . Complication of anesthesia     slow to awaken    Medications:  Prescriptions prior to admission  Medication Sig Dispense Refill  . atorvastatin (LIPITOR) 10 MG tablet Take 10 mg by mouth daily.        . Calcium Carbonate-Vit D-Min 600-400 MG-UNIT TABS Take by mouth daily.        Marland Kitchen CARTIA XT 240 MG 24 hr capsule Take 1 capsule by mouth daily.      . carvedilol (COREG) 25 MG tablet Take 25 mg by mouth daily.       . Cinnamon 500 MG capsule Take 500 mg by mouth daily.        . cyanocobalamin 500 MCG tablet Take 500 mcg by mouth daily.        Marland Kitchen  dorzolamide-timolol (COSOPT) 22.3-6.8 MG/ML ophthalmic solution 1 drop 2 (two) times daily.       Marland Kitchen glipiZIDE (GLUCOTROL) 10 MG 24 hr tablet Take 10 mg by mouth daily.        . hydrochlorothiazide (MICROZIDE) 12.5 MG capsule Take 12.5 mg by mouth daily.      . insulin glargine (LANTUS) 100 UNIT/ML injection Inject 16 Units into the skin at bedtime.       Marland Kitchen latanoprost (XALATAN) 0.005 % ophthalmic solution Place 1 drop into both eyes at bedtime.       Marland Kitchen linagliptin (TRADJENTA) 5 MG TABS tablet Take 5 mg by mouth daily.      Marland Kitchen lisinopril (PRINIVIL,ZESTRIL) 40 MG tablet Take 40 mg by mouth daily.        . Magnesium Oxide 250 MG TABS Take 1 tablet by mouth daily.       . metFORMIN (GLUMETZA) 500 MG (MOD) 24 hr tablet Take 2,000  mg by mouth daily. 4 tabs by  Daily at one time.      . Multiple Vitamins-Minerals (MULTIVITAL) tablet Take 1 tablet by mouth daily.        . Omega-3 Fatty Acids (FISH OIL) 1000 MG CAPS Take 1 capsule by mouth daily.       . vitamin E 400 UNIT capsule Take 400 Units by mouth daily.        Marland Kitchen warfarin (COUMADIN) 5 MG tablet Take 2.5-5 mg by mouth daily. Takes 2.5mg  on Monday, Wednesday, Friday. Takes 5mg  on Tuesday, Thursday, Saturday and sunday      . ONE TOUCH ULTRA TEST test strip         Reuel Boom, PharmD Pager: [phone] 07/14/2014, 8:29 AM

## 2014-07-14 NOTE — Evaluation (Signed)
Physical Therapy Evaluation Patient Details Name: Destiny [name] MRN: [id number] DOB: Nov 29, 1934 Today's Date: 07/14/2014   History of Present Illness  79 y.o. female with history of atrial fibrillation (has been off Coumadin for 2 days due to having skin biopsy), diabetes mellitus, hypertension, hyperlipidemia admitted after an episode of fall mechanical yesterday at home while trying to lock the porch door and presents with right flank pain.  Clinical Impression  Pt currently with functional limitations due to the deficits listed below (see PT Problem List).  Pt will benefit from skilled PT to increase their independence and safety with mobility to allow discharge to the venue listed below.  Pt reports balance issues prior to admission however no falls in over a year.  Pt states she usually holds onto furniture at home but uses cane in community.  Recommended pt use RW at home upon d/c for safety and she was agreeable.  Pt also educated on back precautions to assist with decreasing back pain.     Follow Up Recommendations Home health PT    Equipment Recommendations  None recommended by PT    Recommendations for Other Services       Precautions / Restrictions Precautions Precautions: Fall Restrictions Weight Bearing Restrictions: No      Mobility  Bed Mobility Overal bed mobility: Needs Assistance Bed Mobility: Supine to Sit;Sit to Supine     Supine to sit: Supervision Sit to supine: Supervision   General bed mobility comments: increased due to back pain   Transfers Overall transfer level: Needs assistance Equipment used: Rolling walker (2 wheeled) Transfers: Sit to/from Stand Sit to Stand: Min guard;Supervision         General transfer comment: verbal cues for hand placement, performed from bed and toilet  Ambulation/Gait Ambulation/Gait assistance: Min guard;Supervision Ambulation Distance (Feet): 140 Feet Assistive device: Rolling walker (2 wheeled) Gait  Pattern/deviations: Step-through pattern;Trunk flexed;Decreased stride length     General Gait Details: verbal cues for safe use of RW  Stairs            Wheelchair Mobility    Modified Rankin (Stroke Patients Only)       Balance Overall balance assessment:  (pt reports first fall in over a year)                                           Pertinent Vitals/Pain Pain Assessment: 0-10 Pain Score: 4  Pain Location: R flank and R lower back area Pain Descriptors / Indicators: Grimacing;Sore Pain Intervention(s): Limited activity within patient's tolerance;Monitored during session;Repositioned;Premedicated before session    [place] expects to be discharged to:: Private residence Living Arrangements: Alone   Type of Home: Mobile home Home Access: Stairs to enter   Entrance Stairs-Number of Steps: 2-3 Home Layout: One level Home Equipment: Bend - 2 wheels;Cane - single point;Grab bars - tub/shower;Grab bars - toilet      Prior Function Level of Independence: Independent with assistive device(s)         Comments: pt reports using cane in community     Hand Dominance        Extremity/Trunk Assessment   Upper Extremity Assessment: Overall WFL for tasks assessed           Lower Extremity Assessment: Overall WFL for tasks assessed         Communication   Communication: No difficulties  Cognition Arousal/Alertness: Awake/alert Behavior During Therapy: WFL for tasks assessed/performed Overall Cognitive Status: Within Functional Limits for tasks assessed                      General Comments      Exercises        Assessment/Plan    PT Assessment Patient needs continued PT services  PT Diagnosis Difficulty walking;Acute pain   PT Problem List Decreased balance;Decreased mobility;Decreased knowledge of use of DME;Pain  PT Treatment Interventions Gait training;DME instruction;Stair training;Balance  training;Functional mobility training;Therapeutic activities;Therapeutic exercise;Patient/family education   PT Goals (Current goals can be found in the Care Plan section) Acute Rehab PT Goals PT Goal Formulation: With patient Time For Goal Achievement: 07/21/14 Potential to Achieve Goals: Good    Frequency Min 3X/week   Barriers to discharge        Co-evaluation               End of Session Equipment Utilized During Treatment: Gait belt Activity Tolerance: Patient tolerated treatment well Patient left: in bed;with call bell/phone within reach;with bed alarm set      Functional Assessment Tool Used: clinical judgement Functional Limitation: Mobility: Walking and moving around Mobility: Walking and Moving Around Current Status (N0272): At least 1 percent but less than 20 percent impaired, limited or restricted Mobility: Walking and Moving Around Goal Status [phone]): 0 percent impaired, limited or restricted    Time: 4034-7425 PT Time Calculation (min): 21 min   Charges:   PT Evaluation $Initial PT Evaluation Tier I: 1 Procedure PT Treatments $Gait Training: 8-22 mins   PT G Codes:   Functional Assessment Tool Used: clinical judgement Functional Limitation: Mobility: Walking and moving around    [facility] E 07/14/2014, 12:00 PM Carmelia Bake, PT, DPT 07/14/2014 Pager: [phone]

## 2014-07-15 LAB — GLUCOSE, CAPILLARY
GLUCOSE-CAPILLARY: 227 mg/dL — AB (ref 70–99)
Glucose-Capillary: 116 mg/dL — ABNORMAL HIGH (ref 70–99)

## 2014-07-15 LAB — BASIC METABOLIC PANEL
Anion gap: 12 (ref 5–15)
BUN: 15 mg/dL (ref 6–23)
CO2: 31 mEq/L (ref 19–32)
Calcium: 9.4 mg/dL (ref 8.4–10.5)
Chloride: 94 mEq/L — ABNORMAL LOW (ref 96–112)
Creatinine, Ser: 0.53 mg/dL (ref 0.50–1.10)
GFR calc non Af Amer: 89 mL/min — ABNORMAL LOW (ref >90)
GLUCOSE: 238 mg/dL — AB (ref 70–99)
POTASSIUM: 3.4 meq/L — AB (ref 3.7–5.3)
SODIUM: 137 meq/L (ref 137–147)

## 2014-07-15 LAB — CBC
HEMATOCRIT: 36.1 % (ref 36.0–46.0)
HEMOGLOBIN: 11.3 g/dL — AB (ref 12.0–15.0)
MCH: 30.2 pg (ref 26.0–34.0)
MCHC: 31.3 g/dL (ref 30.0–36.0)
MCV: 96.5 fL (ref 78.0–100.0)
Platelets: 195 10*3/uL (ref 150–400)
RBC: 3.74 MIL/uL — ABNORMAL LOW (ref 3.87–5.11)
RDW: 14.6 % (ref 11.5–15.5)
WBC: 8.3 10*3/uL (ref 4.0–10.5)

## 2014-07-15 LAB — PROTIME-INR
INR: 1.47 (ref 0.00–1.49)
Prothrombin Time: 18 seconds — ABNORMAL HIGH (ref 11.6–15.2)

## 2014-07-15 MED ORDER — POTASSIUM CHLORIDE CRYS ER 20 MEQ PO TBCR
40.0000 meq | EXTENDED_RELEASE_TABLET | Freq: Once | ORAL | Status: AC
  Administered 2014-07-15: 40 meq via ORAL
  Filled 2014-07-15: qty 2

## 2014-07-15 MED ORDER — HYDROCODONE-ACETAMINOPHEN 5-325 MG PO TABS: 1.0000 | ORAL_TABLET | Freq: Four times a day (QID) | ORAL | Status: DC | PRN

## 2014-07-15 MED ORDER — FUROSEMIDE 20 MG PO TABS: 20.0000 mg | ORAL_TABLET | Freq: Every day | ORAL | Status: DC

## 2014-07-15 MED ORDER — CARVEDILOL 12.5 MG PO TABS: 12.5000 mg | ORAL_TABLET | Freq: Two times a day (BID) | ORAL | Status: DC

## 2014-07-15 NOTE — Progress Notes (Signed)
SATURATION QUALIFICATIONS: (This note is used to comply with regulatory documentation for home oxygen)  Patient Saturations on Room Air at Rest = 89%  Patient Saturations on Room Air while Ambulating = 86%  Patient Saturations on 2 Liters of oxygen while Ambulating = 94%  Please briefly explain why patient needs home oxygen: Patient's sats drop while ambulating on room air

## 2014-07-15 NOTE — Progress Notes (Signed)
Patient ID: Destiny [name], female   DOB: [birth year], 79 y.o.   MRN: [id number]    Subjective:  Denies SSCP, palpitations or Dyspnea Nausea better Nurse indicates low sats 88% at rest   Objective:  Filed Vitals:   07/14/14 1614 07/14/14 2120 07/15/14 0434 07/15/14 0605  BP: 114/47 136/66  153/78  Pulse: 85 87  81  Temp: 98 F (36.7 C) 98.2 F (36.8 C)  98.5 F (36.9 C)  TempSrc: Oral Oral  Oral  Resp: 18 18  16   Height:      Weight:   159 lb 12.8 oz (72.485 kg)   SpO2: 97% 99%  100%    Intake/Output from previous day:  Intake/Output Summary (Last 24 hours) at 07/15/14 0835 Last data filed at 07/15/14 0354  Gross per 24 hour  Intake    240 ml  Output   2125 ml  Net  -1885 ml    Physical Exam: Affect appropriate Chronically ill white female  HEENT: normal Neck supple with no adenopathy JVP normal no bruits no thyromegaly Lungs inspitory crackles at base no wheezing and good diaphragmatic motion Heart:  S1/S2 SEM  murmur, no rub, gallop or click PMI normal Abdomen: benighn, BS positve, no tenderness, no AAA no bruit.  No HSM or HJR Distal pulses intact with no bruits No edema Neuro non-focal Skin warm and dry recent skin biopsy left temporal area  No muscular weakness   Lab Results: Basic Metabolic Panel:  Recent Labs  07/13/14 0840 07/14/14 0235  NA 138 139  K 4.1 3.6*  CL 102 100  CO2 25 29  GLUCOSE 185* 174*  BUN 10 10  CREATININE 0.48* 0.51  CALCIUM 8.5 8.8   Liver Function Tests:  Recent Labs  07/13/14 0840  AST 23  ALT 15  ALKPHOS 35*  BILITOT 0.7  PROT 6.6  ALBUMIN 3.3*   CBC:  Recent Labs  07/13/14 0057  07/13/14 0840 07/14/14 0235 07/15/14 0431  WBC 9.9  --  7.3 8.4 8.3  NEUTROABS 8.5*  --  5.4  --   --   HGB 12.3  < > 11.2* 10.7* 11.3*  HCT 37.4  < > 34.8* 32.9* 36.1  MCV 93.7  --  93.5 94.5 96.5  PLT 195  --  179 158 195  < > = values in this interval not displayed. Cardiac Enzymes:  Recent Labs   07/13/14 0057 07/13/14 0840  CKTOTAL  --  101  TROPONINI <0.30  --     Imaging: Dg Chest 2 View  07/14/2014   CLINICAL DATA:  Shortness of breath, possible congestive heart failure  EXAM: CHEST  2 VIEW  COMPARISON:  07/13/2014  FINDINGS: Cardiomegaly. No acute infiltrate or pulmonary edema. Mild basilar atelectasis. Degenerative changes thoracic spine. Atherosclerotic calcifications of thoracic aorta.  IMPRESSION: Cardiomegaly. No acute infiltrate or pulmonary edema. Mild basilar atelectasis.   Electronically Signed   By: Destiny [name] M.D.   On: 07/14/2014 14:48    Cardiac Studies:  ECG:  afib no acute ST changes   Telemetry: afib with good rate control 75-95  Echo:   Medications:   . antiseptic oral rinse  7 mL Mouth Rinse BID  . atorvastatin  10 mg Oral q1800  . carvedilol  12.5 mg Oral BID WC  . cyanocobalamin  500 mcg Oral Daily  . dorzolamide-timolol  1 drop Both Eyes BID  . furosemide  20 mg Intravenous Q12H  . glipiZIDE  10 mg Oral Q breakfast  .  insulin aspart  0-9 Units Subcutaneous TID WC  . insulin glargine  16 Units Subcutaneous QHS  . latanoprost  1 drop Both Eyes QHS  . linagliptin  5 mg Oral Daily  . lisinopril  40 mg Oral Daily  . magnesium oxide  200 mg Oral Daily  . multivitamin with minerals  1 tablet Oral Daily  . omega-3 acid ethyl esters  1 g Oral Daily  . sodium chloride  3 mL Intravenous Q12H  . sodium chloride  3 mL Intravenous Q12H  . vitamin E  400 Units Oral Daily  . Warfarin - Pharmacist Dosing Inpatient   Does not apply q1800        Assessment/Plan:  Afib:  Held for skin biopsy INR 1.47  Give 10 mg today  Derm:  Post left temporal lesion removal sutures out next week Fall:  CT and Xrays with no fractures or hematoma  Improved Nausea:  No signs of ileus CT abdomen negative  Taking PO   Diastolic CHF:  She has a component of COPD She has seen our pulmonary doctors before  F/U CXR now clear with just atelectasis D/C with lasix 20 mg daily  instead of HCTZ  EF normal by echo.  Call our [place] doctors to arrange outpatient f/u next week or two I will see her in office next week  She will need BMET at that time as well as INR check   Jenkins Rouge 07/15/2014, 8:35 AM

## 2014-07-15 NOTE — Discharge Summary (Signed)
Physician Discharge Summary  Destiny [name] [facility] ZHG:[id number] DOB: [birth year] DOA: 07/13/2014  PCP: Abigail Miyamoto, MD  Admit date: 07/13/2014 Discharge date: 07/15/2014  Time spent: 45 minutes  Recommendations for Outpatient Follow-up:  1. *Followup cardiology in 2 weeks  Discharge Diagnoses:  Principal Problem:   Right flank pain Active Problems:   A-fib   Hypoxia   Diabetes mellitus type 2, controlled   Discharge Condition: Stable  Diet recommendation: Low salt diet  Filed Weights   07/13/14 0802 07/14/14 0557 07/15/14 0434  Weight: 74.571 kg (164 lb 6.4 oz) 73.936 kg (163 lb) 72.485 kg (159 lb 12.8 oz)    History of present illness:  79 y.o. female with history of atrial fibrillation, diabetes mellitus, hypertension, hyperlipidemia had a fall yesterday at her house after tripping. Patient was trying to lock the door at her porch when she suddenly lost balance and fell. She did hit her head but did not lose consciousness and started experiencing right flank pain. Patient has been off Coumadin for last 2 days for skin biopsy which was done yesterday. Patient denies any chest pain or shortness of breath. In the ER patient had CT abdomen pelvis chest and head and neck all of which did not show anything acute. Initially patient was found to be in A. fib with RVR which improved without any intervention and when pain was controlled. Patient was found in mild hypoxic in the ER. Patient's pain was initially controlled with morphine but pain seems to be coming back at this time. Patient will be admitted for further observation for her right flank pain and mild hypoxia.   Hospital Course:  1. ? Pulmonary edema- Patient became hypoxic after taking off oxygen. She has elevated BNP 2973. She was started on IV Lasix with very good diuretic response. Discussed with her cardiologist Dr. Johnsie Cancel , who recommends to send her home on by mouth Lasix. Patient will require oxygen at home as her O2 sats  dropped to 87% on ambulation. 2. Right flank pain status post mechanical fall- patient's excess did not show significant abnormality. Continues to require pain medication. She was evaluated by physical therapy and recommend with home health PT. 3. Atrial fibrillation- patient came her initial was high And was in rapid ventricular response but rate is back to normal. Patient is currently on Coumadin along with coreg. Heart rate is controlled continue anticoagulation. Coreg dose was changed per cardiology due to 12.5 mg by mouth twice a day. 4. Cystic pancreatic lesion- patient has CT scan of the abdominal showed cystic lesion along the head of the pancreas. Recommend MRI without and with contrast as outpatient. 5. Hypertension- blood pressure is controlled, continue antihypertensive regimen 6. Diabetes mellitus- continue sliding scale insulin and Lantus 16 units at bedtime. Blood glucose is well controlled.     Procedures:  None  Consultations:  None  Discharge Exam: Filed Vitals:   07/15/14 1009  BP: 111/62  Pulse:   Temp:   Resp:     General: Appear in no acute distress Cardiovascular: S1S2 RRR Respiratory: Clear bilaterally  Discharge Instructions You were cared for by a hospitalist during your hospital stay. If you have any questions about your discharge medications or the care you received while you were in the hospital after you are discharged, you can call the unit and asked to speak with the hospitalist on call if the hospitalist that took care of you is not available. Once you are discharged, your primary care physician will handle  any further medical issues. Please note that NO REFILLS for any discharge medications will be authorized once you are discharged, as it is imperative that you return to your primary care physician (or establish a relationship with a primary care physician if you do not have one) for your aftercare needs so that they can reassess your need for  medications and monitor your lab values.  Discharge Instructions   Diet - low sodium heart healthy    Complete by:  As directed      Increase activity slowly    Complete by:  As directed           Current Discharge Medication List    START taking these medications   Details  furosemide (LASIX) 20 MG tablet Take 1 tablet (20 mg total) by mouth daily. Qty: 30 tablet, Refills: 0    HYDROcodone-acetaminophen (NORCO/VICODIN) 5-325 MG per tablet Take 1-2 tablets by mouth every 6 (six) hours as needed for moderate pain. Qty: 30 tablet, Refills: 0      CONTINUE these medications which have CHANGED   Details  carvedilol (COREG) 12.5 MG tablet Take 1 tablet (12.5 mg total) by mouth 2 (two) times daily with a meal. Qty: 30 tablet, Refills: 2      CONTINUE these medications which have NOT CHANGED   Details  atorvastatin (LIPITOR) 10 MG tablet Take 10 mg by mouth daily.      Calcium Carbonate-Vit D-Min 600-400 MG-UNIT TABS Take by mouth daily.      CARTIA XT 240 MG 24 hr capsule Take 1 capsule by mouth daily.    Cinnamon 500 MG capsule Take 500 mg by mouth daily.      cyanocobalamin 500 MCG tablet Take 500 mcg by mouth daily.      dorzolamide-timolol (COSOPT) 22.3-6.8 MG/ML ophthalmic solution 1 drop 2 (two) times daily.     glipiZIDE (GLUCOTROL) 10 MG 24 hr tablet Take 10 mg by mouth daily.      insulin glargine (LANTUS) 100 UNIT/ML injection Inject 16 Units into the skin at bedtime.     latanoprost (XALATAN) 0.005 % ophthalmic solution Place 1 drop into both eyes at bedtime.     linagliptin (TRADJENTA) 5 MG TABS tablet Take 5 mg by mouth daily.    lisinopril (PRINIVIL,ZESTRIL) 40 MG tablet Take 40 mg by mouth daily.      Magnesium Oxide 250 MG TABS Take 1 tablet by mouth daily.     metFORMIN (GLUMETZA) 500 MG (MOD) 24 hr tablet Take 2,000 mg by mouth daily. 4 tabs by  Daily at one time.    Multiple Vitamins-Minerals (MULTIVITAL) tablet Take 1 tablet by mouth daily.       Omega-3 Fatty Acids (FISH OIL) 1000 MG CAPS Take 1 capsule by mouth daily.     vitamin E 400 UNIT capsule Take 400 Units by mouth daily.      warfarin (COUMADIN) 5 MG tablet Take 2.5-5 mg by mouth daily. Takes 2.5mg  on Monday, Wednesday, Friday. Takes 5mg  on Tuesday, Thursday, Saturday and sunday    ONE TOUCH ULTRA TEST test strip       STOP taking these medications     hydrochlorothiazide (MICROZIDE) 12.5 MG capsule        No Known Allergies Follow-up Information   Follow up with [facility]. (HH[facility])    Contact information:   [address] [place] [address] [phone]       Follow up with [facility]. (HOME  02)    Contact information:   [address] [place] [id number] [phone]        The results of significant diagnostics from this hospitalization (including imaging, microbiology, ancillary and laboratory) are listed below for reference.    Significant Diagnostic Studies: Dg Chest 2 View  07/14/2014   CLINICAL DATA:  Shortness of breath, possible congestive heart failure  EXAM: CHEST  2 VIEW  COMPARISON:  07/13/2014  FINDINGS: Cardiomegaly. No acute infiltrate or pulmonary edema. Mild basilar atelectasis. Degenerative changes thoracic spine. Atherosclerotic calcifications of thoracic aorta.  IMPRESSION: Cardiomegaly. No acute infiltrate or pulmonary edema. Mild basilar atelectasis.   Electronically Signed   By: Lahoma Crocker M.D.   On: 07/14/2014 14:48   Ct Head Wo Contrast  07/13/2014   CLINICAL DATA:  Status post slip and fall yesterday while at home. Does not know whether she hit any object while falling. Hit posterior aspect of head on floor. No loss of consciousness. Concern for head cervical spine injury. Initial encounter.  EXAM: CT HEAD WITHOUT CONTRAST  CT CERVICAL SPINE WITHOUT CONTRAST  TECHNIQUE: Multidetector CT imaging of the head and cervical spine was performed following the standard protocol without  intravenous contrast. Multiplanar CT image reconstructions of the cervical spine were also generated.  COMPARISON:  CT of the paranasal sinuses performed 09/07/2005  FINDINGS: CT HEAD FINDINGS  There is no evidence of acute infarction, mass lesion, or intra- or extra-axial hemorrhage on CT.  The posterior fossa, including the cerebellum, brainstem and fourth ventricle, is within normal limits. The third and lateral ventricles, and basal ganglia are unremarkable in appearance. The cerebral hemispheres are symmetric in appearance, with normal gray-white differentiation. No mass effect or midline shift is seen.  There is no evidence of fracture; visualized osseous structures are unremarkable in appearance. The visualized portions of the orbits are within normal limits. The paranasal sinuses and mastoid air cells are well-aerated. No significant soft tissue abnormalities are seen.  CT CERVICAL SPINE FINDINGS  There is no evidence of fracture or subluxation. Vertebral bodies demonstrate normal height and alignment. Multilevel disc space narrowing is noted along the lower cervical spine, with scattered small anterior and posterior disc osteophyte complexes. Mild underlying facet disease is noted. Prevertebral soft tissues are within normal limits.  A 1.5 cm hypodensity is noted at the left thyroid lobe, with associated calcification. Interstitial prominence is noted at the lung apices. Diffuse vascular calcification is noted along the subclavian arteries bilaterally, and at the carotid bifurcations, with likely at least moderate luminal narrowing at the bifurcations.  IMPRESSION: 1. No evidence of traumatic intracranial injury or fracture. 2. No evidence of fracture or subluxation along the cervical spine. 3. Mild degenerative change noted along the cervical spine. 4. Interstitial prominence at the lung apices. 5. 1.5 cm hypodensity at the left thyroid lobe, with associated calcification. Consider further evaluation with  thyroid ultrasound. If patient is clinically hyperthyroid, consider nuclear medicine thyroid uptake and scan. 6. Diffuse vascular calcification along the subclavian arteries bilaterally. 7. Calcification noted at the carotid bifurcations bilaterally, with likely at least moderate bilateral luminal narrowing. Carotid ultrasound would be helpful for further evaluation, when and as deemed clinically appropriate.   Electronically Signed   By: Garald Balding M.D.   On: 07/13/2014 03:05   Ct Chest W Contrast  07/13/2014   CLINICAL DATA:  Slipped and fell at home a. Injury to back. Pain extending to the right upper and lower quadrants. The patient is on Coumadin. No  loss of consciousness.  EXAM: CT CHEST, ABDOMEN, AND PELVIS WITH CONTRAST  TECHNIQUE: Multidetector CT imaging of the chest, abdomen and pelvis was performed following the standard protocol during bolus administration of intravenous contrast.  CONTRAST:  139mL OMNIPAQUE IOHEXOL 300 MG/ML  SOLN  COMPARISON:  CT abdomen and pelvis 01/06/2014.  FINDINGS: CT CHEST FINDINGS  The heart is enlarged. Atherosclerotic calcifications are noted in the aorta and coronary arteries. Sub cm mediastinal lymph nodes are present. There is no significant axillary or mediastinal adenopathy. Small thyroid nodules are present bilaterally.  Dependent atelectasis is present in the lungs bilaterally. There is mild edema. No significant airspace consolidation is present.  Bone windows demonstrate mild degenerative change. No acute abnormalities present.  CT ABDOMEN AND PELVIS FINDINGS  Diffuse fatty infiltration of the liver is again noted. No focal hepatic lesions are present. Spleen is within normal limits. The stomach and duodenum are unremarkable. A 15 mm cystic lesion is again noted within the head of the the pancreas. There is mild dilation of the pancreatic duct and common bile duct. A mobile gallstone is again seen.  The adrenal glands are normal bilaterally. Bilateral renal  cystic lesions are stable. Collecting system is normal. Urinary bladder is within normal limits.  The rectosigmoid colon demonstrates mild diverticular changes. The colon extends into a left lateral hernia without obstruction. Diverticular present throughout the transverse and ascending colon is well. The cecum is within normal limits. The appendix is not clearly visualized and may be surgically absent. The small bowel is within normal limits. Atherosclerotic calcifications are present in the aorta and branch vessels.  A left inguinal hernia contains fat. Prominent vessels within the left inguinal canal are compatible of varices. Next item degenerate changes are again noted within the lower lumbar spine. A superior endplate fracture of L1 is new since prior study.  The patient is status post hysterectomy. The ovaries are not visualized and likely surgically absent.  IMPRESSION: 1. Left lateral ventral hernia includes a portion of the colon without obstruction. 2. Cystic lesion along the head of the pancreas. There may be some interval growth. Recommend MRI without and with contrast for further evaluation. Non-emergent MRI should be deferred until patient has been discharged for the acute illness, and can optimally cooperate with positioning and breath-holding instructions. 3. Extensive atherosclerotic changes. 4. Stable appearance of left inguinal hernia. 5. Probable varices from the left hemiscrotum.   Electronically Signed   By: Lawrence Santiago M.D.   On: 07/13/2014 03:17   Ct Cervical Spine Wo Contrast  07/13/2014   CLINICAL DATA:  Status post slip and fall yesterday while at home. Does not know whether she hit any object while falling. Hit posterior aspect of head on floor. No loss of consciousness. Concern for head cervical spine injury. Initial encounter.  EXAM: CT HEAD WITHOUT CONTRAST  CT CERVICAL SPINE WITHOUT CONTRAST  TECHNIQUE: Multidetector CT imaging of the head and cervical spine was performed  following the standard protocol without intravenous contrast. Multiplanar CT image reconstructions of the cervical spine were also generated.  COMPARISON:  CT of the paranasal sinuses performed 09/07/2005  FINDINGS: CT HEAD FINDINGS  There is no evidence of acute infarction, mass lesion, or intra- or extra-axial hemorrhage on CT.  The posterior fossa, including the cerebellum, brainstem and fourth ventricle, is within normal limits. The third and lateral ventricles, and basal ganglia are unremarkable in appearance. The cerebral hemispheres are symmetric in appearance, with normal gray-white differentiation. No mass effect or midline  shift is seen.  There is no evidence of fracture; visualized osseous structures are unremarkable in appearance. The visualized portions of the orbits are within normal limits. The paranasal sinuses and mastoid air cells are well-aerated. No significant soft tissue abnormalities are seen.  CT CERVICAL SPINE FINDINGS  There is no evidence of fracture or subluxation. Vertebral bodies demonstrate normal height and alignment. Multilevel disc space narrowing is noted along the lower cervical spine, with scattered small anterior and posterior disc osteophyte complexes. Mild underlying facet disease is noted. Prevertebral soft tissues are within normal limits.  A 1.5 cm hypodensity is noted at the left thyroid lobe, with associated calcification. Interstitial prominence is noted at the lung apices. Diffuse vascular calcification is noted along the subclavian arteries bilaterally, and at the carotid bifurcations, with likely at least moderate luminal narrowing at the bifurcations.  IMPRESSION: 1. No evidence of traumatic intracranial injury or fracture. 2. No evidence of fracture or subluxation along the cervical spine. 3. Mild degenerative change noted along the cervical spine. 4. Interstitial prominence at the lung apices. 5. 1.5 cm hypodensity at the left thyroid lobe, with associated  calcification. Consider further evaluation with thyroid ultrasound. If patient is clinically hyperthyroid, consider nuclear medicine thyroid uptake and scan. 6. Diffuse vascular calcification along the subclavian arteries bilaterally. 7. Calcification noted at the carotid bifurcations bilaterally, with likely at least moderate bilateral luminal narrowing. Carotid ultrasound would be helpful for further evaluation, when and as deemed clinically appropriate.   Electronically Signed   By: Garald Balding M.D.   On: 07/13/2014 03:05   Ct Abdomen Pelvis W Contrast  07/13/2014   CLINICAL DATA:  Slipped and fell at home a. Injury to back. Pain extending to the right upper and lower quadrants. The patient is on Coumadin. No loss of consciousness.  EXAM: CT CHEST, ABDOMEN, AND PELVIS WITH CONTRAST  TECHNIQUE: Multidetector CT imaging of the chest, abdomen and pelvis was performed following the standard protocol during bolus administration of intravenous contrast.  CONTRAST:  166mL OMNIPAQUE IOHEXOL 300 MG/ML  SOLN  COMPARISON:  CT abdomen and pelvis 01/06/2014.  FINDINGS: CT CHEST FINDINGS  The heart is enlarged. Atherosclerotic calcifications are noted in the aorta and coronary arteries. Sub cm mediastinal lymph nodes are present. There is no significant axillary or mediastinal adenopathy. Small thyroid nodules are present bilaterally.  Dependent atelectasis is present in the lungs bilaterally. There is mild edema. No significant airspace consolidation is present.  Bone windows demonstrate mild degenerative change. No acute abnormalities present.  CT ABDOMEN AND PELVIS FINDINGS  Diffuse fatty infiltration of the liver is again noted. No focal hepatic lesions are present. Spleen is within normal limits. The stomach and duodenum are unremarkable. A 15 mm cystic lesion is again noted within the head of the the pancreas. There is mild dilation of the pancreatic duct and common bile duct. A mobile gallstone is again seen.  The  adrenal glands are normal bilaterally. Bilateral renal cystic lesions are stable. Collecting system is normal. Urinary bladder is within normal limits.  The rectosigmoid colon demonstrates mild diverticular changes. The colon extends into a left lateral hernia without obstruction. Diverticular present throughout the transverse and ascending colon is well. The cecum is within normal limits. The appendix is not clearly visualized and may be surgically absent. The small bowel is within normal limits. Atherosclerotic calcifications are present in the aorta and branch vessels.  A left inguinal hernia contains fat. Prominent vessels within the left inguinal canal are compatible of  varices. Next item degenerate changes are again noted within the lower lumbar spine. A superior endplate fracture of L1 is new since prior study.  The patient is status post hysterectomy. The ovaries are not visualized and likely surgically absent.  IMPRESSION: 1. Left lateral ventral hernia includes a portion of the colon without obstruction. 2. Cystic lesion along the head of the pancreas. There may be some interval growth. Recommend MRI without and with contrast for further evaluation. Non-emergent MRI should be deferred until patient has been discharged for the acute illness, and can optimally cooperate with positioning and breath-holding instructions. 3. Extensive atherosclerotic changes. 4. Stable appearance of left inguinal hernia. 5. Probable varices from the left hemiscrotum.   Electronically Signed   By: Lawrence Santiago M.D.   On: 07/13/2014 03:17    Microbiology: No results found for this or any previous visit (from the past 240 hour(s)).   Labs: Basic Metabolic Panel:  Recent Labs Lab 07/13/14 0057 07/13/14 0122 07/13/14 0840 07/14/14 0235 07/15/14 0900  NA 135* 137 138 139 137  K 3.9 3.6* 4.1 3.6* 3.4*  CL 95* 98 102 100 94*  CO2 25  --  25 29 31   GLUCOSE 266* 267* 185* 174* 238*  BUN 12 10 10 10 15   CREATININE  0.43* 0.50 0.48* 0.51 0.53  CALCIUM 9.4  --  8.5 8.8 9.4   Liver Function Tests:  Recent Labs Lab 07/13/14 0840  AST 23  ALT 15  ALKPHOS 35*  BILITOT 0.7  PROT 6.6  ALBUMIN 3.3*   No results found for this basename: LIPASE, AMYLASE,  in the last 168 hours No results found for this basename: AMMONIA,  in the last 168 hours CBC:  Recent Labs Lab 07/13/14 0057 07/13/14 0122 07/13/14 0840 07/14/14 0235 07/15/14 0431  WBC 9.9  --  7.3 8.4 8.3  NEUTROABS 8.5*  --  5.4  --   --   HGB 12.3 13.9 11.2* 10.7* 11.3*  HCT 37.4 41.0 34.8* 32.9* 36.1  MCV 93.7  --  93.5 94.5 96.5  PLT 195  --  179 158 195   Cardiac Enzymes:  Recent Labs Lab 07/13/14 0057 07/13/14 0840  CKTOTAL  --  101  TROPONINI <0.30  --    BNP: BNP (last 3 results)  Recent Labs  07/13/14 0840  PROBNP 2973.0*   CBG:  Recent Labs Lab 07/14/14 0751 07/14/14 1200 07/14/14 1637 07/14/14 2119 07/15/14 0758  GLUCAP 121* 153* 112* 209* 116*    Signed:  Rohith Fauth S  [facility] 07/15/2014, 11:36 AM

## 2014-12-30 ENCOUNTER — Ambulatory Visit (INDEPENDENT_AMBULATORY_CARE_PROVIDER_SITE_OTHER): Payer: Commercial Managed Care - HMO | Admitting: Cardiovascular Disease

## 2014-12-30 ENCOUNTER — Encounter: Payer: Self-pay | Admitting: Cardiovascular Disease

## 2014-12-30 VITALS — BP 158/78 | HR 89 | Ht 62.0 in | Wt 162.0 lb

## 2014-12-30 DIAGNOSIS — I5032 Chronic diastolic (congestive) heart failure: Secondary | ICD-10-CM

## 2014-12-30 DIAGNOSIS — I10 Essential (primary) hypertension: Secondary | ICD-10-CM

## 2014-12-30 DIAGNOSIS — I350 Nonrheumatic aortic (valve) stenosis: Secondary | ICD-10-CM | POA: Diagnosis not present

## 2014-12-30 DIAGNOSIS — I5041 Acute combined systolic (congestive) and diastolic (congestive) heart failure: Secondary | ICD-10-CM

## 2014-12-30 DIAGNOSIS — I482 Chronic atrial fibrillation, unspecified: Secondary | ICD-10-CM

## 2014-12-30 DIAGNOSIS — Z79899 Other long term (current) drug therapy: Secondary | ICD-10-CM

## 2014-12-30 HISTORY — DX: Chronic diastolic (congestive) heart failure: I50.32

## 2014-12-30 LAB — BASIC METABOLIC PANEL
BUN: 17 mg/dL (ref 6–23)
CHLORIDE: 106 meq/L (ref 96–112)
CO2: 30 meq/L (ref 19–32)
Calcium: 9.3 mg/dL (ref 8.4–10.5)
Creatinine, Ser: 0.66 mg/dL (ref 0.40–1.20)
GFR: 92.06 mL/min (ref >60.00)
Glucose, Bld: 185 mg/dL — ABNORMAL HIGH (ref 70–99)
Potassium: 4.1 mEq/L (ref 3.5–5.1)
SODIUM: 140 meq/L (ref 135–145)

## 2014-12-30 LAB — BRAIN NATRIURETIC PEPTIDE: Pro B Natriuretic peptide (BNP): 401 pg/mL — ABNORMAL HIGH (ref 0.0–100.0)

## 2014-12-30 NOTE — Assessment & Plan Note (Signed)
Good rate control and anticoagulation  No bleeding issues

## 2014-12-30 NOTE — Assessment & Plan Note (Signed)
Well controlled.  Continue current medications and low sodium Dash type diet.    

## 2014-12-30 NOTE — Progress Notes (Signed)
Patient ID: BLU MCGLAUN, female   DOB: [birth year], 80 y.o.   MRN: [id number]   Destiny [name] is seen today for F/U chronic afib, She has HTN and lower extremity edema from varicosities. She had successful right knee replacement by Dr. Maureen Ralphs in December 2010 . She continues to struggle with her work at [employer].She has a history of mild MR, EF normal and mild AS . She has been compliant wi th her meds. There is no history of SSSCP or CAD. Has postural dizzyness.   Tried Xarelto but cost too much Back on coumadin since 2013 Normally had venous INR done at [place] office Last year was off coumadin for kidney stone surgery and did not require lovenox bridge  Reviewed labs from [place]: LDL 62 A1c 7.2 Normal LFT;s   Echo 3/15  Study Conclusions  - Left ventricle: The cavity size was mildly dilated. Wall thickness was increased in a pattern of moderate LVH. Systolic function was normal. The estimated ejection fraction was in the range of 60% to 65%. Wall motion was normal; there were no regional wall motion abnormalities. - Aortic valve: There was mild stenosis. - Left atrium: The atrium was moderately to severely dilated. - Right atrium: The atrium was moderately dilated.  Mean gradient 15 mmHg peak 27    Last visit HR 46  And cardizem / Digoxen stopped  Feels much better with more energy Insulin dose also lowered   In hospital 10/15 with CHF  Still with more dyspnea and LE edema than usual  Using sliding scale lasix for weights around 160  Needs f/u echo for AS   ROS: Denies fever, malais, weight loss, blurry vision, decreased visual acuity, cough, sputum, SOB, hemoptysis, pleuritic pain, palpitaitons, heartburn, abdominal pain, melena, lower extremity edema, claudication, or rash.  All other systems reviewed and negative  General: Affect appropriate Chronically ill white female  HEENT: normal Neck supple with no adenopathy JVP normal no bruits no thyromegaly Lungs clear  and  good diaphragmatic motion Heart:  S1/S2 AS  murmur, no rub, gallop or click PMI normal Abdomen: benighn, BS positve, no tenderness, no AAA no bruit.  No HSM or HJR Distal pulses intact with no bruits Plus one bilateral edema with varicose veins  Neuro non-focal Skin warm and dry  Basal cell over left temporal area  No muscular weakness   Current Outpatient Prescriptions  Medication Sig Dispense Refill  . atorvastatin (LIPITOR) 20 MG tablet Take 20 mg by mouth daily. Take 1/2 tablet daily    . Blood Glucose Monitoring Suppl (ACCU-CHEK AVIVA PLUS) W/DEVICE KIT     . Calcium Carbonate-Vit D-Min 600-400 MG-UNIT TABS Take by mouth daily.      . carvedilol (COREG) 12.5 MG tablet Take 1 tablet (12.5 mg total) by mouth 2 (two) times daily with a meal. (Patient taking differently: Take 12.5 mg by mouth daily. ) 30 tablet 2  . Cinnamon 500 MG capsule Take 500 mg by mouth daily.      . cyanocobalamin 500 MCG tablet Take 500 mcg by mouth daily.      . dorzolamide-timolol (COSOPT) 22.3-6.8 MG/ML ophthalmic solution 1 drop 2 (two) times daily.     . furosemide (LASIX) 40 MG tablet Take 40 mg by mouth daily.    Marland Kitchen glipiZIDE (GLUCOTROL) 10 MG 24 hr tablet Take 10 mg by mouth daily.      . insulin glargine (LANTUS) 100 UNIT/ML injection Inject 16 Units into the skin at bedtime.     Marland Kitchen  KLOR-CON M20 20 MEQ tablet Take 20 mg by mouth daily.    Marland Kitchen latanoprost (XALATAN) 0.005 % ophthalmic solution Place 1 drop into both eyes at bedtime.     Marland Kitchen linagliptin (TRADJENTA) 5 MG TABS tablet Take 5 mg by mouth daily.    Marland Kitchen lisinopril (PRINIVIL,ZESTRIL) 40 MG tablet Take 40 mg by mouth daily.      . magnesium oxide (MAG-OX) 400 MG tablet Take 400 mg by mouth daily.    . metFORMIN (GLUMETZA) 500 MG (MOD) 24 hr tablet Take 2,000 mg by mouth daily. 4 tabs by  Daily at one time.    . Multiple Vitamins-Minerals (MULTIVITAL) tablet Take 1 tablet by mouth daily.      . Omega-3 Fatty Acids (FISH OIL) 1000 MG CAPS Take 1 capsule  by mouth daily.     . ONE TOUCH ULTRA TEST test strip     . vitamin E 400 UNIT capsule Take 400 Units by mouth daily.      Marland Kitchen warfarin (COUMADIN) 5 MG tablet Take 2.5-5 mg by mouth daily. Takes 2.73m on Monday, Wednesday, Friday. Takes 562mon Tuesday, Thursday, Saturday and sunday     No current facility-administered medications for this visit.    Allergies  Review of patient's allergies indicates no known allergies.  Electrocardiogram:  06/2014  afib rate 56  LVH with strain   Assessment and Plan

## 2014-12-30 NOTE — Assessment & Plan Note (Signed)
Agree with SS lasix  Echo to assess EF and ? Worsening AS.  BMET and BNP  F/u with me after echo

## 2014-12-30 NOTE — Patient Instructions (Addendum)
Your physician wants you to follow-up in: AFTER ECHO DONE  WITH DR Blima Singer will receive a reminder letter in the mail two months in advance. If you don't receive a letter, please call our office to schedule the follow-up appointment. Your physician recommends that you continue on your current medications as directed. Please refer to the Current Medication list given to you today. Your physician recommends that you return for lab work in: [place] has requested that you have an echocardiogram. Echocardiography is a painless test that uses sound waves to create images of your heart. It provides your doctor with information about the size and shape of your heart and how well your heart's chambers and valves are working. This procedure takes approximately one hour. There are no restrictions for this procedure.

## 2015-01-06 ENCOUNTER — Ambulatory Visit (HOSPITAL_COMMUNITY): Payer: Commercial Managed Care - HMO | Attending: Cardiology | Admitting: Radiology

## 2015-01-06 DIAGNOSIS — E119 Type 2 diabetes mellitus without complications: Secondary | ICD-10-CM | POA: Insufficient documentation

## 2015-01-06 DIAGNOSIS — I350 Nonrheumatic aortic (valve) stenosis: Secondary | ICD-10-CM | POA: Diagnosis not present

## 2015-01-06 DIAGNOSIS — I10 Essential (primary) hypertension: Secondary | ICD-10-CM | POA: Diagnosis not present

## 2015-01-06 DIAGNOSIS — Z87891 Personal history of nicotine dependence: Secondary | ICD-10-CM | POA: Insufficient documentation

## 2015-01-06 NOTE — Progress Notes (Signed)
Echocardiogram performed.  

## 2015-02-03 ENCOUNTER — Ambulatory Visit (INDEPENDENT_AMBULATORY_CARE_PROVIDER_SITE_OTHER): Payer: Commercial Managed Care - HMO | Admitting: Cardiovascular Disease

## 2015-02-03 ENCOUNTER — Encounter: Payer: Self-pay | Admitting: Cardiovascular Disease

## 2015-02-03 VITALS — BP 110/60 | HR 93 | Ht 62.0 in | Wt 159.8 lb

## 2015-02-03 DIAGNOSIS — Z79899 Other long term (current) drug therapy: Secondary | ICD-10-CM

## 2015-02-03 MED ORDER — FUROSEMIDE 40 MG PO TABS: ORAL_TABLET | ORAL | Status: DC

## 2015-02-03 NOTE — Progress Notes (Signed)
Patient ID: Destiny [name], female   DOB: 04-Aug-1935, 80 y.o.   MRN: [id number]   Destiny [name] is seen today for F/U chronic afib, She has HTN and lower extremity edema from varicosities. She had successful right knee replacement by Dr. Maureen [name] in December 2010 . She is retired from [employer] She has a history of mild MR, EF normal and mild AS . She has been compliant wi th her meds. There is no history of SSSCP or CAD. Has postural dizzyness.   Tried Xarelto but cost too much Back on coumadin since 2013 Normally had venous INR done at [place] office Last year was off coumadin for kidney stone surgery and did not require lovenox bridge   Reviewed labs from [place]: LDL 62 A1c 7.2 Normal LFT;s   12/28/14 marked change in EF compared to 2015 when it was normal Study Conclusions  - Left ventricle: The cavity size was normal. There was mild concentric hypertrophy. Systolic function was moderately to severely reduced. The estimated ejection fraction was in the range of 30% to 35%. Wall motion was normal; there were no regional wall motion abnormalities. Doppler parameters are consistent with a reversible restrictive pattern, indicative of decreased left ventricular diastolic compliance and/or increased left atrial pressure (grade 3 diastolic dysfunction). - Aortic valve: Fixed non coronary cusp. There was mild stenosis. It is possible that gradient is more severe than measured secondary to reduced stroke volume in the setting of reduced EF. Mean gradient (S): 13 mm Hg. - Mitral valve: There was mild regurgitation directed posteriorly. - Left atrium: The atrium was severely dilated. - Right ventricle: The cavity size was mildly dilated. Wall thickness was normal. - Right atrium: The atrium was severely dilated. - Tricuspid valve: There was moderate regurgitation.   Cardizem and digoxen d/c due to bradycardia  Insulin dose also lowered   Only taking coreg once/day  still with exertional dyspnea    ROS: Denies fever, malais, weight loss, blurry vision, decreased visual acuity, cough, sputum, SOB, hemoptysis, pleuritic pain, palpitaitons, heartburn, abdominal pain, melena, lower extremity edema, claudication, or rash.  All other systems reviewed and negative  General: Affect appropriate Chronically ill white female  HEENT: normal Neck supple with no adenopathy JVP normal no bruits no thyromegaly Lungs clear  and good diaphragmatic motion Heart:  S1/S2 AS  murmur, no rub, gallop or click PMI normal Abdomen: benighn, BS positve, no tenderness, no AAA no bruit.  No HSM or HJR Distal pulses intact with no bruits Plus one bilateral edema with varicose veins  Neuro non-focal Skin warm and dry  Basal cell over left temporal area  No muscular weakness   Current Outpatient Prescriptions  Medication Sig Dispense Refill  . atorvastatin (LIPITOR) 20 MG tablet Take 20 mg by mouth daily. Take 1/2 tablet daily    . Blood Glucose Monitoring Suppl (ACCU-CHEK AVIVA PLUS) W/DEVICE KIT     . Calcium Carbonate-Vit D-Min 600-400 MG-UNIT TABS Take by mouth daily.      . carvedilol (COREG) 12.5 MG tablet Take 1 tablet (12.5 mg total) by mouth 2 (two) times daily with a meal. (Patient taking differently: Take 12.5 mg by mouth daily. ) 30 tablet 2  . Cinnamon 500 MG capsule Take 500 mg by mouth daily.      . cyanocobalamin 500 MCG tablet Take 500 mcg by mouth daily.      . dorzolamide-timolol (COSOPT) 22.3-6.8 MG/ML ophthalmic solution 1 drop 2 (two) times daily.     . furosemide (LASIX)  40 MG tablet Take 40 mg by mouth daily.    Marland Kitchen glipiZIDE (GLUCOTROL) 10 MG 24 hr tablet Take 10 mg by mouth daily.      . insulin glargine (LANTUS) 100 UNIT/ML injection Inject 16 Units into the skin at bedtime.     Marland Kitchen KLOR-CON M20 20 MEQ tablet Take 20 mg by mouth daily.    Marland Kitchen latanoprost (XALATAN) 0.005 % ophthalmic solution Place 1 drop into both eyes at bedtime.     Marland Kitchen linagliptin  (TRADJENTA) 5 MG TABS tablet Take 5 mg by mouth daily.    Marland Kitchen lisinopril (PRINIVIL,ZESTRIL) 40 MG tablet Take 40 mg by mouth daily.      . magnesium oxide (MAG-OX) 400 MG tablet Take 400 mg by mouth daily.    . metFORMIN (GLUMETZA) 500 MG (MOD) 24 hr tablet Take 2,000 mg by mouth daily. 4 tabs by  Daily at one time.    . Multiple Vitamins-Minerals (MULTIVITAL) tablet Take 1 tablet by mouth daily.      . Omega-3 Fatty Acids (FISH OIL) 1000 MG CAPS Take 1 capsule by mouth daily.     . ONE TOUCH ULTRA TEST test strip     . vitamin E 400 UNIT capsule Take 400 Units by mouth daily.      Marland Kitchen warfarin (COUMADIN) 5 MG tablet Take 2.5-5 mg by mouth daily. Takes 2.$RemoveBefore'5mg'lZQqqDuxhEMMZ$  on Monday, Wednesday, Friday. Takes $RemoveBefor'5mg'btwChuEcLUYQ$  on Tuesday, Thursday, Saturday and sunday     No current facility-administered medications for this visit.    Allergies  Review of patient's allergies indicates no known allergies.  Electrocardiogram:  06/2014  afib rate 56  LVH with strain   Assessment and Plan CHF:  No CAD by distant cath.  No chest pain Likely NIDCM continue medical Rx and increase lasix 40/20  Take coreg bid BMET and BNP in 4 weeks  Afib:  Good rate control and anticoagulation.  INR with Dr Destiny [name] 4 weeks  Consider right and left cath if she does not improve with optimal medical Rx

## 2015-02-03 NOTE — Patient Instructions (Addendum)
Medication Instructions:  INCREASE FUROSEMIDE  TAKE   20 MG  IN THE   AFTERNOON AND  CONTINUE WITH  THE  40 MG IN AM  MAKE SURE  YOU TAKE  CARVEDILOL TWICE  DAILY  Labwork:4 WEEKS  BMET BNP  Testing/Procedures: NONE   Follow-Up:  Your physician recommends that you schedule a follow-up appointment in: [place]   DR Johnsie Cancel Any Other Special Instructions Will Be Listed Below (If Applicable).

## 2015-02-22 ENCOUNTER — Telehealth: Payer: Self-pay | Admitting: Cardiovascular Disease

## 2015-02-22 MED ORDER — ATORVASTATIN CALCIUM 20 MG PO TABS: 20.0000 mg | ORAL_TABLET | Freq: Every day | ORAL | Status: DC

## 2015-02-22 MED ORDER — WARFARIN SODIUM 5 MG PO TABS: 5.0000 mg | ORAL_TABLET | Freq: Every day | ORAL | Status: DC

## 2015-02-22 NOTE — Telephone Encounter (Signed)
Pt would like to have reorder Lipitor 20 mg, 1/2 tablet daily and Coumadin 5 mg one tablet daily. Prescription for these medications were sent to [facility] in [place] to pt  She is coming to this office for PT/ INR check on May 26 th, and she has an appointment with Dr Johnsie Cancel on July 13 th 2016. Pt is aware.

## 2015-02-22 NOTE — Telephone Encounter (Signed)
New message    Patient calling would like a call back today need to discuss warfarin & lipitor.

## 2015-02-24 ENCOUNTER — Other Ambulatory Visit: Payer: Self-pay | Admitting: *Deleted

## 2015-02-24 MED ORDER — ATORVASTATIN CALCIUM 20 MG PO TABS: 10.0000 mg | ORAL_TABLET | Freq: Every day | ORAL | Status: DC

## 2015-02-28 ENCOUNTER — Telehealth: Payer: Self-pay | Admitting: Cardiovascular Disease

## 2015-02-28 NOTE — Telephone Encounter (Signed)
New problem    Pt want to know if she can increase her lasix because at night she gets sob. Please call pt today.

## 2015-02-28 NOTE — Telephone Encounter (Signed)
PER  PT HAD  EPISODE YESTERDAY  WAS  SWOLLEN AND  SOB  WAS  WANTING TO KNOW IF SHOULD  TAKE  LASIX  40 MG  BID  CURRENTLY  IS  TAKING  40 MG  AM  AND  20 MG PM   PER  PT  FEELS FINE  TODAY  AND   IS  DOWN  2  LBS  WILL  FORWARD TO DR Johnsie Cancel FOR  REVIEW .Adonis Housekeeper

## 2015-03-01 NOTE — Telephone Encounter (Signed)
Yes can increase evening dose to 40 mg when she feels swollen, SOB or weight up

## 2015-03-02 ENCOUNTER — Other Ambulatory Visit: Payer: Self-pay | Admitting: *Deleted

## 2015-03-02 DIAGNOSIS — Z79899 Other long term (current) drug therapy: Secondary | ICD-10-CM

## 2015-03-02 NOTE — Telephone Encounter (Signed)
PT  AWARE./CY 

## 2015-03-03 ENCOUNTER — Other Ambulatory Visit (INDEPENDENT_AMBULATORY_CARE_PROVIDER_SITE_OTHER): Payer: Commercial Managed Care - HMO | Admitting: *Deleted

## 2015-03-03 ENCOUNTER — Telehealth: Payer: Self-pay | Admitting: Cardiovascular Disease

## 2015-03-03 DIAGNOSIS — Z79899 Other long term (current) drug therapy: Secondary | ICD-10-CM | POA: Diagnosis not present

## 2015-03-03 LAB — BASIC METABOLIC PANEL
BUN: 13 mg/dL (ref 6–23)
CALCIUM: 9.5 mg/dL (ref 8.4–10.5)
CHLORIDE: 102 meq/L (ref 96–112)
CO2: 32 meq/L (ref 19–32)
Creatinine, Ser: 0.67 mg/dL (ref 0.40–1.20)
GFR: 90.43 mL/min (ref >60.00)
Glucose, Bld: 198 mg/dL — ABNORMAL HIGH (ref 70–99)
POTASSIUM: 3.8 meq/L (ref 3.5–5.1)
Sodium: 139 mEq/L (ref 135–145)

## 2015-03-03 LAB — BRAIN NATRIURETIC PEPTIDE: PRO B NATRI PEPTIDE: 362 pg/mL — AB (ref 0.0–100.0)

## 2015-03-03 LAB — PROTIME-INR
INR: 3.1 ratio — AB (ref 0.8–1.0)
Prothrombin Time: 33.3 s — ABNORMAL HIGH (ref 9.6–13.1)

## 2015-03-03 NOTE — Telephone Encounter (Signed)
Walk in pt form-Sealed Envelope-dropped off Christine back Friday ( 5.27.16)

## 2015-03-04 ENCOUNTER — Telehealth: Payer: Self-pay | Admitting: *Deleted

## 2015-03-04 NOTE — Telephone Encounter (Signed)
-----   Message from Theophilus Kinds, RN sent at 03/04/2015  7:33 AM EDT ----- This is not a [facility] pt, we do not manage or monitor her INR's.  Thanks

## 2015-03-04 NOTE — Telephone Encounter (Signed)
LAB RESULTS  GIVEN TO PT  COPY  FORWARDED TO  DR  Maceo Pro  TO   ADDRESS  INR .Adonis Housekeeper

## 2015-03-08 ENCOUNTER — Telehealth: Payer: Self-pay | Admitting: Cardiovascular Disease

## 2015-03-08 NOTE — Telephone Encounter (Signed)
Dr.Nishan completed Handicapped form and Altha Harm mailed to patient home address (03/04/15)

## 2015-04-19 NOTE — Progress Notes (Signed)
Patient ID: Destiny Destiny [name], female   DOB: 04/27/1935, 80 y.o.   MRN: [id number]   Destiny Destiny [name] is seen today for F/U chronic afib, She has HTN and lower extremity edema from varicosities. She had successful right knee replacement by Dr. Maureen Ralphs in December 2010 . She is retired from [employer] She has a history of mild MR, EF normal and mild AS . She has been compliant wi th her meds. There is no history of SSSCP or CAD. Has postural dizzyness.   Tried Xarelto but cost too much Back on coumadin since 2013 Normally had venous INR done at [place] office Last year was off coumadin for kidney stone surgery and did not require lovenox bridge   Reviewed labs from [place]: LDL 62 A1c 7.2 Normal LFT;s   12/28/14 marked change in EF compared to 2015 when it was normal Study Conclusions  - Left ventricle: The cavity size was normal. There was mild concentric hypertrophy. Systolic function was moderately to severely reduced. The estimated ejection fraction was in the range of 30% to 35%. Wall motion was normal; there were no regional wall motion abnormalities. Doppler parameters are consistent with a reversible restrictive pattern, indicative of decreased left ventricular diastolic compliance and/or increased left atrial pressure (grade 3 diastolic dysfunction). - Aortic valve: Fixed non coronary cusp. There was mild stenosis. It is possible that gradient is more severe than measured secondary to reduced stroke volume in the setting of reduced EF. Mean gradient (S): 13 mm Hg. - Mitral valve: There was mild regurgitation directed posteriorly. - Left atrium: The atrium was severely dilated. - Right ventricle: The cavity size was mildly dilated. Wall thickness was normal. - Right atrium: The atrium was severely dilated. - Tricuspid valve: There was moderate regurgitation.   Cardizem and digoxen d/c due to bradycardia  Insulin dose also lowered   Only taking coreg once/day  still with exertional dyspnea  April meds for CHF adjusted and lasix increased to 40/20 mg daily  BNP (last 3 results) No results for input(s): BNP in the last 8760 hours.  ProBNP (last 3 results)  Recent Labs  07/13/14 0840 12/30/14 1143 03/03/15 1522  PROBNP 2973.0* 401.0* 362.0*   Lab Results  Component Value Date   CREATININE 0.67 03/03/2015   BUN 13 03/03/2015   NA 139 03/03/2015   K 3.8 03/03/2015   CL 102 03/03/2015   CO2 32 03/03/2015      ROS: Denies fever, malais, weight loss, blurry vision, decreased visual acuity, cough, sputum, SOB, hemoptysis, pleuritic pain, palpitaitons, heartburn, abdominal pain, Destiny [name], lower extremity edema, claudication, or rash.  All other systems reviewed and negative  General: Affect appropriate Chronically ill white female  HEENT: normal Neck supple with no adenopathy JVP normal no bruits no thyromegaly Lungs clear  and good diaphragmatic motion Heart:  S1/S2 AS  murmur, no rub, gallop or click PMI normal Abdomen: benighn, BS positve, no tenderness, no AAA no bruit.  No HSM or HJR Distal pulses intact with no bruits Plus one bilateral edema with varicose veins  Neuro non-focal Skin warm and dry  Basal cell over left temporal area  No muscular weakness   Current Outpatient Prescriptions  Medication Sig Dispense Refill  . atorvastatin (LIPITOR) 20 MG tablet Take 0.5 tablets (10 mg total) by mouth daily at 6 PM. 45 tablet 3  . Blood Glucose Monitoring Suppl (ACCU-CHEK AVIVA PLUS) W/DEVICE KIT     . Calcium Carbonate-Vit D-Min 600-400 MG-UNIT TABS Take 1 tablet by mouth  daily.     . Cinnamon 500 MG capsule Take 500 mg by mouth daily.      . cyanocobalamin 500 MCG tablet Take 500 mcg by mouth daily.      . dorzolamide-timolol (COSOPT) 22.3-6.8 MG/ML ophthalmic solution Place 1 drop into both eyes 2 (two) times daily.     . furosemide (LASIX) 40 MG tablet TAKE  40 MG IN AM  AND  20 MG IN PM 45 tablet 11  . glipiZIDE (GLUCOTROL)  10 MG 24 hr tablet Take 10 mg by mouth daily.      . insulin glargine (LANTUS) 100 UNIT/ML injection Inject 16 Units into the skin at bedtime.     Marland Kitchen KLOR-CON M20 20 MEQ tablet Take 20 mg by mouth daily.    Marland Kitchen latanoprost (XALATAN) 0.005 % ophthalmic solution Place 1 drop into both eyes at bedtime.     Marland Kitchen linagliptin (TRADJENTA) 5 MG TABS tablet Take 5 mg by mouth daily.    Marland Kitchen lisinopril (PRINIVIL,ZESTRIL) 40 MG tablet Take 40 mg by mouth daily.      . magnesium oxide (MAG-OX) 400 MG tablet Take 400 mg by mouth daily.    . metFORMIN (GLUMETZA) 500 MG (MOD) 24 hr tablet Take 1,000 mg by mouth daily. 2 tabs by  Daily at one time.    . Multiple Vitamins-Minerals (MULTIVITAL) tablet Take 1 tablet by mouth daily.      . Omega-3 Fatty Acids (FISH OIL) 1000 MG CAPS Take 1 capsule by mouth daily.     . ONE TOUCH ULTRA TEST test strip     . vitamin E 400 UNIT capsule Take 400 Units by mouth daily.      Marland Kitchen warfarin (COUMADIN) 5 MG tablet Take 1 tablet (5 mg total) by mouth daily. Takes 2.$RemoveBefore'5mg'SIHoHmXuxCXpl$  on Monday, Wednesday, Friday. Takes $RemoveBefor'5mg'gNobTTrcMOyl$  on Tuesday, Thursday, Saturday and sunday 90 tablet 3  . carvedilol (COREG) 12.5 MG tablet Take 1 tablet (12.5 mg total) by mouth 2 (two) times daily with a meal. 30 tablet 2  . carvedilol (COREG) 25 MG tablet Take 25 mg by mouth 2 (two) times daily.     No current facility-administered medications for this visit.    Allergies  Review of patient's allergies indicates no known allergies.  Electrocardiogram:  06/2014  afib rate 56  LVH with strain   Assessment and Plan CHF:  No CAD by distant cath.  No chest pain Likely NIDCM continue medical Rx and increase lasix 40/20  Take coreg bid BMET and BNP today no need for cath at this time   Afib:  Good rate control and anticoagulation.  INR with Dr Maceo Pro 4 weeks  Consider right and left cath if she does not improve with optimal medical Rx

## 2015-04-20 ENCOUNTER — Ambulatory Visit (INDEPENDENT_AMBULATORY_CARE_PROVIDER_SITE_OTHER): Payer: Commercial Managed Care - HMO | Admitting: Cardiovascular Disease

## 2015-04-20 ENCOUNTER — Telehealth: Payer: Self-pay | Admitting: Cardiovascular Disease

## 2015-04-20 ENCOUNTER — Encounter: Payer: Self-pay | Admitting: Cardiovascular Disease

## 2015-04-20 VITALS — BP 110/68 | HR 91 | Ht 62.0 in | Wt 166.0 lb

## 2015-04-20 DIAGNOSIS — R0602 Shortness of breath: Secondary | ICD-10-CM | POA: Diagnosis not present

## 2015-04-20 DIAGNOSIS — Z79899 Other long term (current) drug therapy: Secondary | ICD-10-CM

## 2015-04-20 LAB — BASIC METABOLIC PANEL
BUN: 15 mg/dL (ref 6–23)
CO2: 32 mEq/L (ref 19–32)
CREATININE: 0.8 mg/dL (ref 0.40–1.20)
Calcium: 9.7 mg/dL (ref 8.4–10.5)
Chloride: 104 mEq/L (ref 96–112)
GFR: 73.67 mL/min (ref >60.00)
Glucose, Bld: 140 mg/dL — ABNORMAL HIGH (ref 70–99)
POTASSIUM: 4.1 meq/L (ref 3.5–5.1)
Sodium: 143 mEq/L (ref 135–145)

## 2015-04-20 LAB — BRAIN NATRIURETIC PEPTIDE: Pro B Natriuretic peptide (BNP): 352 pg/mL — ABNORMAL HIGH (ref 0.0–100.0)

## 2015-04-20 NOTE — Patient Instructions (Signed)
Medication Instructions:  CALL AND LET  ME  KNOW  STRENGTH OF  CARVEDILOL   Labwork: TODAY  BMET  BNP  Testing/Procedures: NONE  Follow-Up: Your physician wants you to follow-up in: [place] will receive a reminder letter in the mail two months in advance. If you don't receive a letter, please call our office to schedule the follow-up appointment.  Any Other Special Instructions Will Be Listed Below (If Applicable).

## 2015-04-20 NOTE — Telephone Encounter (Signed)
PT  TAKING CARVEDILOL   25 MG   TWICE   DAILY  NOT  12.5 MG   HAD  BMET  DONE  TODAY    PT  ASKING IF  NEEDS  TO  CONT  WITH  THE  20 MG  OF  LASIX IN AFTERNOON .Adonis Housekeeper

## 2015-04-20 NOTE — Telephone Encounter (Signed)
New message     Pt just left office and is calling about the carvedilol you discussed at the appointment. Please call to advise

## 2015-04-20 NOTE — Telephone Encounter (Signed)
PT  AWARE  TO CONT  TO  TAKE  CARVEDILOL 25 MG BID  AND  FUROSEMIDE  40 MG  AM AND  20 MG  PM .Adonis Housekeeper

## 2015-04-20 NOTE — Telephone Encounter (Signed)
WILL DISCUSS WITH  DR Johnsie Cancel

## 2015-06-08 ENCOUNTER — Encounter: Payer: Self-pay | Admitting: Cardiovascular Disease

## 2015-06-08 LAB — PROTIME-INR: INR: 3.7 — AB (ref 0.9–1.1)

## 2015-07-18 ENCOUNTER — Telehealth: Payer: Self-pay | Admitting: Cardiovascular Disease

## 2015-07-18 NOTE — Telephone Encounter (Signed)
New message    patient calling in reference to [employer] form.

## 2015-07-18 NOTE — Telephone Encounter (Signed)
PER  PT  WILL MAIL [facility]  ASSISTANCE FORMS TO  OFFICE  TO  HAVE  FILLED  OUT  AND  RETURNED  PT  WOULD LIKE COPY AS  WELL ./CY

## 2015-07-27 NOTE — Telephone Encounter (Signed)
PT AWARE FORM  FILLED OUT  AND  FAXED  AND  COPY MAILED TO PT AT   PT'S REQUEST .Destiny [name]

## 2015-07-27 NOTE — Telephone Encounter (Signed)
LM TO CALL BACK ./CY 

## 2015-10-19 NOTE — Progress Notes (Signed)
Patient ID: Destiny [name], female   DOB: 26-Jan-1935, 81 y.o.   MRN: [id number]   Destiny [name] is seen today for F/U chronic afib, She has HTN and lower extremity edema from varicosities. She had successful right knee replacement by Dr. Maureen Ralphs in December 2010 . She is retired from [employer] She has a history of mild MR, EF normal and mild AS . She has been compliant wi th her meds. There is no history of SSSCP or CAD. Has postural dizzyness.   Tried Xarelto but cost too much Back on coumadin since 2013 Normally had venous INR done at [facility] office Last year was off coumadin for kidney stone surgery and did not require lovenox bridge   Reviewed labs from [facility]: LDL 62 A1c 7.2 Normal LFT;s   12/28/14 marked change in EF compared to 2015 when it was normal Study Conclusions  - Left ventricle: The cavity size was normal. There was mild concentric hypertrophy. Systolic function was moderately to severely reduced. The estimated ejection fraction was in the range of 30% to 35%. Wall motion was normal; there were no regional wall motion abnormalities. Doppler parameters are consistent with a reversible restrictive pattern, indicative of decreased left ventricular diastolic compliance and/or increased left atrial pressure (grade 3 diastolic dysfunction). - Aortic valve: Fixed non coronary cusp. There was mild stenosis. It is possible that gradient is more severe than measured secondary to reduced stroke volume in the setting of reduced EF. Mean gradient (S): 13 mm Hg. - Mitral valve: There was mild regurgitation directed posteriorly. - Left atrium: The atrium was severely dilated. - Right ventricle: The cavity size was mildly dilated. Wall thickness was normal. - Right atrium: The atrium was severely dilated. - Tricuspid valve: There was moderate regurgitation.   Cardizem and digoxen d/c due to bradycardia  Insulin dose also lowered   Only taking coreg once/day  still with exertional dyspnea  April meds for CHF adjusted and lasix increased to 40/20 mg daily    ProBNP (last 3 results)  Recent Labs  12/30/14 1143 03/03/15 1522 04/20/15 1505  PROBNP 401.0* 362.0* 352.0*   Lab Results  Component Value Date   CREATININE 0.80 04/20/2015   BUN 15 04/20/2015   NA 143 04/20/2015   K 4.1 04/20/2015   CL 104 04/20/2015   CO2 32 04/20/2015      ROS: Denies fever, malais, weight loss, blurry vision, decreased visual acuity, cough, sputum, SOB, hemoptysis, pleuritic pain, palpitaitons, heartburn, abdominal pain, melena, lower extremity edema, claudication, or rash.  All other systems reviewed and negative  General: Affect appropriate Chronically ill white female  HEENT: normal Neck supple with no adenopathy JVP normal no bruits no thyromegaly Lungs clear  and good diaphragmatic motion Heart:  S1/S2 AS  murmur, no rub, gallop or click PMI normal Abdomen: benighn, BS positve, no tenderness, no AAA no bruit.  No HSM or HJR Distal pulses intact with no bruits Plus one bilateral edema with varicose veins  Neuro non-focal Skin warm and dry  Basal cell over left temporal area  No muscular weakness   Current Outpatient Prescriptions  Medication Sig Dispense Refill  . atorvastatin (LIPITOR) 20 MG tablet Take 0.5 tablets (10 mg total) by mouth daily at 6 PM. 45 tablet 3  . Calcium Carbonate-Vit D-Min 600-400 MG-UNIT TABS Take 1 tablet by mouth daily.     . carvedilol (COREG) 25 MG tablet Take 25 mg by mouth 2 (two) times daily.    . Cinnamon 500 MG  capsule Take 500 mg by mouth daily.      . dorzolamide-timolol (COSOPT) 22.3-6.8 MG/ML ophthalmic solution Place 1 drop into both eyes 2 (two) times daily.     . furosemide (LASIX) 40 MG tablet TAKE 40 MG BY MOUTH IN THE AM & 20 MG BY MOUTH IN THE PM    . glipiZIDE (GLUCOTROL) 10 MG 24 hr tablet Take 10 mg by mouth daily.      Marland Kitchen KLOR-CON M20 20 MEQ tablet Take 20 mg by mouth daily.    Marland Kitchen latanoprost  (XALATAN) 0.005 % ophthalmic solution Place 1 drop into both eyes at bedtime.     Marland Kitchen linagliptin (TRADJENTA) 5 MG TABS tablet Take 5 mg by mouth daily.    Marland Kitchen lisinopril (PRINIVIL,ZESTRIL) 40 MG tablet Take 40 mg by mouth daily.      . magnesium oxide (MAG-OX) 400 MG tablet Take 400 mg by mouth daily.    . metFORMIN (GLUMETZA) 500 MG (MOD) 24 hr tablet Take 1,000 mg by mouth daily.     . Multiple Vitamins-Minerals (MULTIVITAL) tablet Take 1 tablet by mouth daily.      . Omega-3 Fatty Acids (FISH OIL) 1000 MG CAPS Take 1 capsule by mouth daily.     . vitamin E 400 UNIT capsule Take 400 Units by mouth daily.      Marland Kitchen warfarin (COUMADIN) 5 MG tablet Take 1 tablet (5 mg total) by mouth daily. Takes 2.5mg  on Monday, Wednesday, Friday. Takes 5mg  on Tuesday, Thursday, Saturday and sunday 90 tablet 3   No current facility-administered medications for this visit.    Allergies  Review of patient's allergies indicates no known allergies.  Electrocardiogram:  06/2014  afib rate 56  LVH with strain  10/20/15  afib rate 95 nonspecific ST changes   Assessment and Plan CHF:  No CAD by distant cath.  No chest pain Likely NIDCM continue medical Rx and increase lasix 40/20  Take coreg bid BMET and BNP today no need for cath at this time   Afib:  Good rate control and anticoagulation.  INR with Dr Maceo Pro 4 weeks  Consider right and left cath if she does not improve with optimal medical Rx  Jenkins Rouge

## 2015-10-20 ENCOUNTER — Ambulatory Visit
Admission: RE | Admit: 2015-10-20 | Discharge: 2015-10-20 | Disposition: A | Discharge status: Home / Self Care | Payer: Commercial Managed Care - HMO | Source: Ambulatory Visit | Attending: Cardiovascular Disease | Admitting: Cardiovascular Disease

## 2015-10-20 ENCOUNTER — Ambulatory Visit (INDEPENDENT_AMBULATORY_CARE_PROVIDER_SITE_OTHER): Payer: Commercial Managed Care - HMO | Admitting: Cardiovascular Disease

## 2015-10-20 ENCOUNTER — Encounter: Payer: Self-pay | Admitting: Cardiovascular Disease

## 2015-10-20 VITALS — BP 114/78 | HR 57 | Ht 61.0 in | Wt 168.1 lb

## 2015-10-20 DIAGNOSIS — I5041 Acute combined systolic (congestive) and diastolic (congestive) heart failure: Secondary | ICD-10-CM

## 2015-10-20 DIAGNOSIS — R0602 Shortness of breath: Secondary | ICD-10-CM

## 2015-10-20 DIAGNOSIS — Z79899 Other long term (current) drug therapy: Secondary | ICD-10-CM

## 2015-10-20 LAB — BASIC METABOLIC PANEL
BUN: 12 mg/dL (ref 7–25)
CHLORIDE: 103 mmol/L (ref 98–110)
CO2: 31 mmol/L (ref 20–31)
Calcium: 9.1 mg/dL (ref 8.6–10.4)
Creat: 0.63 mg/dL (ref 0.60–0.93)
GLUCOSE: 116 mg/dL — AB (ref 65–99)
POTASSIUM: 3.9 mmol/L (ref 3.5–5.3)
SODIUM: 140 mmol/L (ref 135–146)

## 2015-10-20 MED ORDER — KLOR-CON M20 20 MEQ PO TBCR: 20.0000 meq | EXTENDED_RELEASE_TABLET | Freq: Every day | ORAL | Status: DC

## 2015-10-20 MED ORDER — ATORVASTATIN CALCIUM 20 MG PO TABS
10.0000 mg | ORAL_TABLET | Freq: Every day | ORAL | Status: DC
Start: 2015-10-20 — End: 2016-05-02

## 2015-10-20 MED ORDER — FUROSEMIDE 40 MG PO TABS: ORAL_TABLET | ORAL | Status: DC

## 2015-10-20 MED ORDER — CARVEDILOL 25 MG PO TABS: 25.0000 mg | ORAL_TABLET | Freq: Two times a day (BID) | ORAL | Status: DC

## 2015-10-20 MED ORDER — LISINOPRIL 40 MG PO TABS: 40.0000 mg | ORAL_TABLET | Freq: Every day | ORAL | Status: DC

## 2015-10-20 NOTE — Patient Instructions (Addendum)
Medication Instructions:  Your physician recommends that you continue on your current medications as directed. Please refer to the Current Medication list given to you today.  Labwork: Your physician recommends that you have lab work today - BMET and BNP  Testing/Procedures: A chest x-ray takes a picture of the organs and structures inside the chest, including the heart, lungs, and blood vessels. This test can show several things, including, whether the heart is enlarges; whether fluid is building up in the lungs; and whether pacemaker / defibrillator leads are still in place.  Follow-Up: Your physician wants you to follow-up in: 2 weeks with [facility].  Your physician recommends that you schedule a follow-up appointment next available with Dr. Johnsie Cancel.   You have an appointment with Dr. Howard Pouch on 10/24/2015 at 2:00 PM  If you need a refill on your cardiac medications before your next appointment, please call your pharmacy.

## 2015-10-21 LAB — BRAIN NATRIURETIC PEPTIDE: Brain Natriuretic Peptide: 197.6 pg/mL — ABNORMAL HIGH (ref 0.0–100.0)

## 2015-10-24 ENCOUNTER — Encounter: Payer: Self-pay | Admitting: Family Medicine

## 2015-10-24 ENCOUNTER — Ambulatory Visit (INDEPENDENT_AMBULATORY_CARE_PROVIDER_SITE_OTHER): Payer: Commercial Managed Care - HMO | Admitting: Family Medicine

## 2015-10-24 VITALS — BP 134/84 | HR 76 | Temp 97.8°F | Resp 20 | Ht 61.0 in | Wt 165.2 lb

## 2015-10-24 DIAGNOSIS — I5042 Chronic combined systolic (congestive) and diastolic (congestive) heart failure: Secondary | ICD-10-CM

## 2015-10-24 DIAGNOSIS — I4891 Unspecified atrial fibrillation: Secondary | ICD-10-CM

## 2015-10-24 DIAGNOSIS — Z7901 Long term (current) use of anticoagulants: Secondary | ICD-10-CM | POA: Diagnosis not present

## 2015-10-24 DIAGNOSIS — I080 Rheumatic disorders of both mitral and aortic valves: Secondary | ICD-10-CM

## 2015-10-24 DIAGNOSIS — I350 Nonrheumatic aortic (valve) stenosis: Secondary | ICD-10-CM

## 2015-10-24 DIAGNOSIS — Z7189 Other specified counseling: Secondary | ICD-10-CM

## 2015-10-24 DIAGNOSIS — E119 Type 2 diabetes mellitus without complications: Secondary | ICD-10-CM

## 2015-10-24 DIAGNOSIS — Z7689 Persons encountering health services in other specified circumstances: Secondary | ICD-10-CM

## 2015-10-24 DIAGNOSIS — R011 Cardiac murmur, unspecified: Secondary | ICD-10-CM

## 2015-10-24 MED ORDER — WARFARIN SODIUM 5 MG PO TABS: 5.0000 mg | ORAL_TABLET | Freq: Every day | ORAL | Status: DC

## 2015-10-24 MED ORDER — GLIPIZIDE ER 10 MG PO TB24: 10.0000 mg | ORAL_TABLET | Freq: Every day | ORAL | Status: AC

## 2015-10-24 NOTE — Patient Instructions (Signed)
INR every 4 weeks A1c in March with diabetes follow up.  Monitor your sugars daily fasting and write them down, bring with you to next visit.

## 2015-10-24 NOTE — Progress Notes (Signed)
Subjective:    Patient ID: Destiny [name], female    DOB: 14-Jul-1935, 81 y.o.   MRN: [id number]  HPI  Patient presents for new patient establishment with multiple chronic comorbidities. All past medical history, surgical history, allergies, family history, immunizations and social history was obtained from the patient today and entered into the electronic medical record. Records are requested from her prior PCP, and will be reviewed at the time they are received. All medical records will be updated at that time.  Patient comes with no records today. Patient transferred from [facility], Dr. Maceo Pro.   Care providers:  Dr. Johnsie Cancel for AHF clinic ([facility]). Dr. Aundra Dubin scheduled in Feb. (CHF clinic)  Dr. Tammi Klippel for urologist  Dr. Candie Mile GYN --> completes bone density and mammogram.  Dr. Link Snuffer- Dermatology Dr. Richardine Service doctor.   Diabetes: Patient seems not completely certain on the medication she takes for her diabetes. She is prescribed metformin 1000 mg daily and glipizide 10 mg daily. She is uncertain if she's prescribed Tradjenta, she states she takes this medication when her "doctor has free samples only." She reports not taking this medication for at least 4 weeks, and only intermittently prior. She does monitor her blood glucoses regularly, brings fasting glucoses with her today. She uses the Accu-check test strip Aviva plus and needs refills on these today. Patient denies any hypo-or hyperglycemic events, she denies any new numbness or tingling in her extremities. She reports being up-to-date with her ophthalmology exam. Patient states her last A1c was 6.1. Patient brings her glucose log with her today which range from 89-129 fasting glucose. GFR from 10/20/2015 >60. Patient is not on insulin therapy.  Cardiac: Patient has a history of carotid bruit, cardiac murmur, hypertension, atrial fibrillation, chronic combined systolic and diastolic congestive heart failure,  last echo 01/06/2015 with an ejection fraction of 30-35 percent. Grade 3 diastolic dysfunction. Mild aortic stenosis. Mild mitral regurg. Moderate tricuspid regurg. Severely dilated left and right atrium. Mildly dilated right ventricle. Chronic Coumadin use, with unknown INRs. Patient states that she wants to have her INR completed by PCP office for convenience. Patient receiving cardiac medications per cardiology. She states that she is taking Coumadin 2.5 mg on Monday, 5 mg all other days a week. Patient was reportedly tried on Xarelto, however cost was too great to continue.It appears patient had been on Cardizem and digoxin at one time but it was discontinued secondary to bradycardia. Recent BNP 197.6 on 10/20/2015. Patient reports compliance with her Lipitor 10 mg daily, Coreg 25 mg twice a day , Lasix 40 mg in the morning, 20 mg in the evening. Lisinopril 40 mg daily. Patient is being considered for right and left cardiac cath for her symptoms of shortness of breath, if symptoms do not improve with medical treatment per cardiology note. EKG: 06/2014 A. fib rate 56 LVH with strain.  EKG 10/20/2015: A. fib rate 95, nonspecific ST changes. Patient is uncertain her dry weight, however patient weighed 168.12 this past week in cardiology, Lasix was increased and patient now weighs 165.25. Patient endorses some mild weakness, that has been steadily increasing with shortness of breath over the last few months. She denies any lower extremity edema, chest pain or dizziness today. She has an appointment with the CHF clinic this coming February.  Echo 01/06/2015:  Study Conclusions - Left ventricle: The cavity size was normal. There was mild concentric hypertrophy. Systolic function was moderately to severely reduced. The estimated ejection fraction was in the  range of 30% to 35%. Wall motion was normal; there were no regional wall motion abnormalities. Doppler parameters are consistent with a  reversible restrictive pattern, indicative of decreased left ventricular diastolic compliance and/or increased left atrial pressure (grade 3 diastolic dysfunction). - Aortic valve: Fixed non coronary cusp. There was mild stenosis. It is possible that gradient is more severe than measured secondary to reduced stroke volume in the setting of reduced EF. Mean gradient (S): 13 mm Hg. - Mitral valve: There was mild regurgitation directed posteriorly. - Left atrium: The atrium was severely . - Right ventricle: The cavity size was mildly dilated. Wall thickness was normal. - Right atrium: The atrium was severely dilated. - Tricuspid valve: There was moderate regurgitation.  Chest x-ray 10/20/2015: Enlarged cardiac silhouette. Central pulmonary vascularity is engorged and there is a mild cephalization of the vascular pattern. RLR infiltrate or pleural effusion. Lungs are well expanded. Calcification throughout the wall of the thoracic aorta. Thoracic vertebral bodies are preserved in height. There is a stable 50% anterior wedge compression of L1. CHF with mild interstitial edema similar to that seen previously. There may be underlying COPD. There is no evidence of pneumonia.  Depression screen PHQ 2/9 10/24/2015  Decreased Interest 0  Down, Depressed, Hopeless 0  PHQ - 2 Score 0    Fall Risk  10/24/2015  Falls in the past year? No  Risk for fall due to : History of fall(s);Impaired balance/gait     Past Medical History  Diagnosis Date  . Mitral valve insufficiency and aortic valve insufficiency   . Unspecified essential hypertension   . Mononeuritis of unspecified site   . Edema   . Calculus of kidney   . Unspecified glaucoma   . Pure hypercholesterolemia   . Osteoarthrosis, unspecified whether generalized or localized, unspecified site   . Atrial fibrillation ([facility]) 1999-diagnosed  . Diabetes mellitus without mention of complication   . Complication of anesthesia      slow to awaken  . Diastolic CHF, chronic ([facility]) 12/30/2014  . Cataract     left eye (removed)   No Known Allergies Past Surgical History  Procedure Laterality Date  . Catarct surgery Left   . Tonsillectomy  1943  . Appendectomy  1954  . Abdominal hysterectomy  2000    partial  . Transesophageal echocardiogram  02/2012  . Holmium laser application Right [id number]    Procedure: HOLMIUM LASER APPLICATION;  Surgeon: Alexis Frock, MD;  Location[facility];  Service: Urology;  Laterality: Right;  . Cystoscopy w/ retrogrades Right 11/26/2012    Procedure: CYSTOSCOPY WITH RETROGRADE PYELOGRAM;  Surgeon: Alexis Frock, MD;  Location: [name];  Service: Urology;  Laterality: Right;  . Joint replacement Right 2009    knee  . Kidney stone      ? Dr Tammi Klippel   Family History  Problem Relation Age of Onset  . Cancer Mother 108    leukemia   . Breast cancer Neg Hx   . Colon cancer Neg Hx    Social History   Social History  . Marital Status: Widowed    Spouse Name: N/A  . Number of Children: N/A  . Years of Education: N/A   Occupational History  . Not on file.   Social History Main Topics  . Smoking status: Former Smoker    Quit date: 07/11/1988  . Smokeless tobacco: Never Used  . Alcohol Use: Yes     Comment: occasional  . Drug Use: No  .  Sexual Activity: No   Other Topics Concern  . Not on file   Social History Narrative   Widow. Lives alone. Attended business college.   Lives in one story home. Has living will.    Walker and/or four pronged cane for assistance.    Former smoker of 29 years. Quit smoking 1989.   Patient drinks caffeinated beverages, takes a daily vitamin.   Wears her seatbelt, wears dentures, smoke detector located in the home.       Review of Systems Negative, with the exception of above mentioned in HPI    Objective:   Physical Exam BP 134/84 mmHg  Pulse 76  Temp(Src) 97.8 F (36.6 C) (Oral)  Resp 20  Ht 5'  1" (1.549 m)  Wt 165 lb 4 oz (74.957 kg)  BMI 31.24 kg/m2  SpO2 93% Gen: Afebrile. No acute distress. Nontoxic in appearance, well-developed, well-nourished, Caucasian female, present with 4 prong cane. HENT: AT. [place]. Bilateral TM visualized and normal in appearance. MMM, no oral lesions.  Eyes:Pupils Equal Round Reactive to light, Extraocular movements intact,  Conjunctiva without redness, discharge or icterus. Neck/lymp/endocrine: Supple, no lymphadenopathy, no thyromegaly CV: RRR 1/6 systolic murmur appreciated, no r/c/g, trace edema, diminished but equal pulses bilateral posterior tibialis. No JVD appreciated. Chest: CTAB, no wheeze or crackles. Normal respiratory effort. Moderate air movement. Abd: Soft. Obese. NTND. BS present. No Masses palpated.  Skin: No rashes, purpura or petechiae. Multiple varicosities of lower extremities. Neuro: Slow gait, with cane assistance. PERLA. EOMi. Alert. Oriented x3.  Psych: Normal affect, dress and demeanor. Normal speech. Normal thought content and judgment.  Assessment & Plan:  Destiny [name] is a 81 y.o. female presents for new patient establishment with multiple chronic medical issues. She does not bring records with her today. Long term (current) use of anticoagulants - Protime-INR--> patient would like to have INR drawn a primary care physician office for convenience. - Patient can have INR drawn at this office, by future orders from cardiology and they can either be followed by cardiology or PCP, now all providers are within the system. - INR collected today  Chronic combined systolic and diastolic congestive heart failure (HCC)/AS/AF - EMR review, appears to be chronic diastolic, with new found systolic congestive heart failure. Patient has CHF clinic appt within a few weeks. She doesn't appear fluid overloaded today. She had no acute cardiac complaints today.  Diabetes mellitus type 2, controlled: Presumed controlled diabetes at this time  by patient report of fasting glucose monitoring she has brought in today [id number]). I am not certain the regimen she is on, will continue metformin and glipizide today. Patient is to continue monitoring her fasting blood sugars, and return for A1c after March 1. If patient's fasting blood sugars rise without use of Tradjenta, she is to call in sooner and we will restart this medication. It seems this was being use only intermittently? - Refill on test Strips called in today.  Follow up Diabetes management after March 1 with A1c. Rooming.   Greater than 45 minutes was spent with patient, greater than 50% of that time was spent face-to-face with patient counseling and coordinating care.

## 2015-10-25 ENCOUNTER — Telehealth: Payer: Self-pay | Admitting: Family Medicine

## 2015-10-25 ENCOUNTER — Encounter: Payer: Self-pay | Admitting: Family Medicine

## 2015-10-25 DIAGNOSIS — Z7901 Long term (current) use of anticoagulants: Principal | ICD-10-CM

## 2015-10-25 DIAGNOSIS — I350 Nonrheumatic aortic (valve) stenosis: Secondary | ICD-10-CM | POA: Insufficient documentation

## 2015-10-25 DIAGNOSIS — Z5181 Encounter for therapeutic drug level monitoring: Secondary | ICD-10-CM

## 2015-10-25 DIAGNOSIS — I5042 Chronic combined systolic (congestive) and diastolic (congestive) heart failure: Secondary | ICD-10-CM | POA: Insufficient documentation

## 2015-10-25 LAB — PROTIME-INR
INR: 3.95 — AB (ref <1.50)
PROTHROMBIN TIME: 39.2 s — AB (ref 11.6–15.2)

## 2015-10-25 NOTE — Telephone Encounter (Signed)
Left message for patient to return call for results and instructions.

## 2015-10-25 NOTE — Telephone Encounter (Signed)
Please call pt: - Her INR is elevated.  She reported on her new pt establishment appt, she was taking 2.5 mg Monday only and 5 mg all other days.She needs to decrease her coumadin dose to 2.5 mg M,W,F and 5 mg on the other days. INR recheck in 2 weeks, once stable with extend back to 4 weeks. Future order placed.  - Please forward to Dr. Johnsie Cancel office.

## 2015-10-25 NOTE — Telephone Encounter (Signed)
Spoke with patient reviewed all lab results and instructions patient scheduled for lab visit. Patient verbalized understanding of coumadin dosing.

## 2015-10-28 ENCOUNTER — Other Ambulatory Visit: Payer: Self-pay | Admitting: Family Medicine

## 2015-10-28 MED ORDER — GLUCOSE BLOOD VI STRP: ORAL_STRIP | Status: AC

## 2015-11-07 ENCOUNTER — Other Ambulatory Visit (HOSPITAL_COMMUNITY): Payer: Self-pay | Admitting: *Deleted

## 2015-11-07 ENCOUNTER — Encounter (HOSPITAL_COMMUNITY): Payer: Self-pay | Admitting: *Deleted

## 2015-11-07 ENCOUNTER — Ambulatory Visit (HOSPITAL_COMMUNITY)
Admission: RE | Admit: 2015-11-07 | Discharge: 2015-11-07 | Disposition: A | Discharge status: Home / Self Care | Payer: Commercial Managed Care - HMO | Source: Ambulatory Visit | Attending: Cardiology | Admitting: Cardiology

## 2015-11-07 ENCOUNTER — Encounter (HOSPITAL_COMMUNITY): Payer: Self-pay

## 2015-11-07 VITALS — BP 140/86 | HR 91 | Wt 163.2 lb

## 2015-11-07 DIAGNOSIS — I482 Chronic atrial fibrillation: Secondary | ICD-10-CM | POA: Insufficient documentation

## 2015-11-07 DIAGNOSIS — Z96651 Presence of right artificial knee joint: Secondary | ICD-10-CM | POA: Diagnosis not present

## 2015-11-07 DIAGNOSIS — I5022 Chronic systolic (congestive) heart failure: Secondary | ICD-10-CM

## 2015-11-07 DIAGNOSIS — I5042 Chronic combined systolic (congestive) and diastolic (congestive) heart failure: Secondary | ICD-10-CM | POA: Diagnosis present

## 2015-11-07 DIAGNOSIS — Z87891 Personal history of nicotine dependence: Secondary | ICD-10-CM | POA: Insufficient documentation

## 2015-11-07 DIAGNOSIS — I4891 Unspecified atrial fibrillation: Secondary | ICD-10-CM | POA: Diagnosis not present

## 2015-11-07 DIAGNOSIS — I110 Hypertensive heart disease with heart failure: Secondary | ICD-10-CM | POA: Insufficient documentation

## 2015-11-07 DIAGNOSIS — Z79899 Other long term (current) drug therapy: Secondary | ICD-10-CM | POA: Diagnosis not present

## 2015-11-07 DIAGNOSIS — Z7984 Long term (current) use of oral hypoglycemic drugs: Secondary | ICD-10-CM | POA: Insufficient documentation

## 2015-11-07 DIAGNOSIS — E119 Type 2 diabetes mellitus without complications: Secondary | ICD-10-CM | POA: Insufficient documentation

## 2015-11-07 DIAGNOSIS — Z7901 Long term (current) use of anticoagulants: Secondary | ICD-10-CM | POA: Insufficient documentation

## 2015-11-07 LAB — PROTIME-INR
INR: 2.2 — ABNORMAL HIGH (ref 0.00–1.49)
Prothrombin Time: 24.3 seconds — ABNORMAL HIGH (ref 11.6–15.2)

## 2015-11-07 LAB — BASIC METABOLIC PANEL
Anion gap: 13 (ref 5–15)
BUN: 9 mg/dL (ref 6–20)
CHLORIDE: 103 mmol/L (ref 101–111)
CO2: 27 mmol/L (ref 22–32)
Calcium: 9.6 mg/dL (ref 8.9–10.3)
Creatinine, Ser: 0.64 mg/dL (ref 0.44–1.00)
GFR calc non Af Amer: >60 mL/min (ref >60)
Glucose, Bld: 248 mg/dL — ABNORMAL HIGH (ref 65–99)
POTASSIUM: 3.7 mmol/L (ref 3.5–5.1)
SODIUM: 143 mmol/L (ref 135–145)

## 2015-11-07 LAB — CBC
HEMATOCRIT: 35.4 % — AB (ref 36.0–46.0)
HEMOGLOBIN: 11 g/dL — AB (ref 12.0–15.0)
MCH: 31.3 pg (ref 26.0–34.0)
MCHC: 31.1 g/dL (ref 30.0–36.0)
MCV: 100.6 fL — AB (ref 78.0–100.0)
Platelets: 179 10*3/uL (ref 150–400)
RBC: 3.52 MIL/uL — AB (ref 3.87–5.11)
RDW: 15.2 % (ref 11.5–15.5)
WBC: 6.2 10*3/uL (ref 4.0–10.5)

## 2015-11-07 MED ORDER — FUROSEMIDE 40 MG PO TABS: ORAL_TABLET | ORAL | Status: DC

## 2015-11-07 MED ORDER — SACUBITRIL-VALSARTAN 24-26 MG PO TABS: 1.0000 | ORAL_TABLET | Freq: Two times a day (BID) | ORAL | Status: DC

## 2015-11-07 MED ORDER — KLOR-CON M20 20 MEQ PO TBCR: 40.0000 meq | EXTENDED_RELEASE_TABLET | Freq: Every day | ORAL | Status: DC

## 2015-11-07 NOTE — Patient Instructions (Signed)
Increase Furosemide to 60 mg (1.5 tabs) in AM and 40 mg (1 tab) in PM  Increase Potassium to 40 meq (2 tabs) daily  Stop Lisinopril  Start Entresto 24/26 mg Twice daily, START ON WED 2/1 AM  Labs today  Heart Catheterization on Wed 2/8, see instruction sheet  Your physician recommends that you schedule a follow-up appointment in: 3 weeks

## 2015-11-08 ENCOUNTER — Other Ambulatory Visit: Payer: Commercial Managed Care - HMO

## 2015-11-08 ENCOUNTER — Telehealth: Payer: Self-pay | Admitting: *Deleted

## 2015-11-08 NOTE — Telephone Encounter (Signed)
Agree with plan 

## 2015-11-08 NOTE — Progress Notes (Signed)
Patient ID: Destiny [name], female   DOB: 1935-01-15, 81 y.o.   MRN: [id number] PCP: Dr Raoul Pitch Cardiology: Dr. Johnsie Cancel HF Cardiology: Dr. Aundra Dubin  81 yo with chronic atrial fibrillation and chronic systolic CHF presents for CHF clinic evaluation.  She has been in atrial fibrillation long-term.  However, she has been more short of breath with exertion since 9/15.  This has gradually worsened.  Echo in 3/15 actually showed normal EF.  Given increased dyspnea, echo was done in 3/16.  This showed EF 30-35%, newly decreased.  Her functional status has declined since then.  She is now short of breath walking 50-100 feet, making up her bed, carrying any load, and vacuuming.  She has orthopnea and PND regularly.  She had to stop 3 times walking into the office today.  No lightheadedness.  No syncope.  No palpitations.  HR is controlled. Lasix has helped some but not a lot. No chest pain.   Recent CXR showed pulmonary edema.  Labs (1/17): K 3.9, creatinine 0.63, BNP 198  ECG: atrial fibrillation, right superior axis  PMH: 1. HTN 2. DM type II 3. Chronic atrial fibrillation 4. Right TKR 12/10.  5. Chronic systolic CHF:  - Remote cath showed no significant CAD> - Echo (3/15) with EF 60-65%.  - Echo (3/16) with EF 30-35%, mild LVH, mild aortic stenosis mean gradient 13 mmHg, mild MR, RV mildly dilated with normal systolic function, moderate TR.   FH: Atrial fibrillation.  No CAD.  SH: Retired, widow, lives in [place], quit smoking 1989, originally from [place], has 7 kids.   ROS: All systems reviewed and negative except as per HPI.   Current Outpatient Prescriptions  Medication Sig Dispense Refill  . atorvastatin (LIPITOR) 20 MG tablet Take 0.5 tablets (10 mg total) by mouth daily at 6 PM. 45 tablet 3  . Calcium Carbonate-Vit D-Min 600-400 MG-UNIT TABS Take 1 tablet by mouth daily.     . carvedilol (COREG) 25 MG tablet Take 1 tablet (25 mg total) by mouth 2 (two) times daily with a meal. 180  tablet 3  . Cinnamon 500 MG capsule Take 500 mg by mouth daily.      . dorzolamide-timolol (COSOPT) 22.3-6.8 MG/ML ophthalmic solution Place 1 drop into both eyes 2 (two) times daily.     . furosemide (LASIX) 40 MG tablet Take 1.5 tabs in AM and 1 tab in pM 225 tablet 3  . glipiZIDE (GLUCOTROL XL) 10 MG 24 hr tablet Take 1 tablet (10 mg total) by mouth daily. 90 tablet 1  . glucose blood (ACCU-CHEK AVIVA PLUS) test strip Use as instructed 100 each 11  . KLOR-CON M20 20 MEQ tablet Take 2 tablets (40 mEq total) by mouth daily. 180 tablet 3  . latanoprost (XALATAN) 0.005 % ophthalmic solution Place 1 drop into both eyes at bedtime.     . magnesium oxide (MAG-OX) 400 MG tablet Take 400 mg by mouth daily.    . metFORMIN (GLUMETZA) 500 MG (MOD) 24 hr tablet Take 1,000 mg by mouth daily.     . Multiple Vitamins-Minerals (MULTIVITAL) tablet Take 1 tablet by mouth daily.      . Omega-3 Fatty Acids (FISH OIL) 1000 MG CAPS Take 1 capsule by mouth daily.     . vitamin E 400 UNIT capsule Take 400 Units by mouth daily.      Marland Kitchen warfarin (COUMADIN) 5 MG tablet Take 1 tablet (5 mg total) by mouth daily. Takes 2.5mg  on Monday, and 5mg   all other days. 90 tablet 3  . sacubitril-valsartan (ENTRESTO) 24-26 MG Take 1 tablet by mouth 2 (two) times daily. 60 tablet 3   No current facility-administered medications for this encounter.   BP 140/86 mmHg  Pulse 91  Wt 163 lb 4 oz (74.05 kg)  SpO2 95% General: NAD Neck: JVP 12 cm, no thyromegaly or thyroid nodule.  Lungs: Clear to auscultation bilaterally with normal respiratory effort. CV: Nondisplaced PMI.  Heart irregular S1/S2, no [id number], 3/6 systolic crescendo-decrescendo murmur RUSB with clear S2.  1+ edema to knees bilaterally.  No carotid bruit.  Normal pedal pulses.  Abdomen: Soft, nontender, no hepatosplenomegaly, no distention.  Skin: Intact without lesions or rashes.  Neurologic: Alert and oriented x 3.  Psych: Normal affect. Extremities: No clubbing or  cyanosis.  HEENT: Normal.   Assessment/Plan: 1. Chronic systolic CHF: Echo (0000000) with EF 30-35%, diffuse hypokinesis.  NYHA class IIIb symptoms.  She is very volume overloaded today.  Unsure etiology of cardiomyopathy => ?nonischemic given no chest pain and no clear ischemic event.  - Increase Lasix to 60 qam/40 qpm.  - Increase KCl to 40 daily.  - Stop lisinopril, after 36 hours, start Entresto 24/26 bid.   - BMET today and again in 2 wks.  - I think that she needs right and left heart cath => assess filling pressures/cardiac output and also assess for CAD as cause of cardiomyopathy.  We discussed the procedure today including risks/benefits.  She agrees to proceed.  I will have her hold warfarin 3 days prior to procedure.  2. Atrial fibrillation: Chronic.  Rate is controlled. She has been in atrial fibrillation for years, unlikely to be able to successfully cardiovert her. Continue warfarin.   Loralie Champagne 11/08/2015

## 2015-11-08 NOTE — Telephone Encounter (Signed)
Patient had PT/INR drawn yesterday at cardiology office It was 2.20. Patient inquired about her dosing. Instructed patient to remain on current dosing of 2.5 mg M,W,F and 5mg  on T,TH,Sat, and Sun Per Dr Raoul Pitch. Patient is to hold her coumadin 3 days prior to her cath procedure as instructed by cardiology. Patient verbalized understanding.

## 2015-11-09 ENCOUNTER — Telehealth (HOSPITAL_COMMUNITY): Payer: Self-pay | Admitting: *Deleted

## 2015-11-09 NOTE — Telephone Encounter (Signed)
R&L hart cath approved through [employer] back through 05/16/16

## 2015-11-10 ENCOUNTER — Telehealth (HOSPITAL_COMMUNITY): Payer: Self-pay

## 2015-11-10 DIAGNOSIS — I5042 Chronic combined systolic (congestive) and diastolic (congestive) heart failure: Secondary | ICD-10-CM

## 2015-11-10 NOTE — Telephone Encounter (Signed)
Will be here on Monday for BMET

## 2015-11-10 NOTE — Telephone Encounter (Signed)
That is good.  Weight coming down.  Needs BMET.

## 2015-11-10 NOTE — Telephone Encounter (Signed)
Patient calling to report her weight loss: 2/2  154 lb 2/1  158 lb 1/31 159 lb 1/30 163 lb  OV note copied/pasted from 1/30:  Increase Lasix to 60 qam/40 qpm.  - Increase KCl to 40 daily.  - Stop lisinopril, after 36 hours, start Entresto 24/26 bid.  - BMET today and again in 2 wks

## 2015-11-10 NOTE — Telephone Encounter (Signed)
Placing and mailing a lab order for a BMET to be done on Monday 2/6

## 2015-11-14 ENCOUNTER — Other Ambulatory Visit (INDEPENDENT_AMBULATORY_CARE_PROVIDER_SITE_OTHER): Payer: Commercial Managed Care - HMO

## 2015-11-14 DIAGNOSIS — I509 Heart failure, unspecified: Secondary | ICD-10-CM

## 2015-11-14 LAB — BASIC METABOLIC PANEL
BUN: 17 mg/dL (ref 6–23)
CHLORIDE: 99 meq/L (ref 96–112)
CO2: 31 meq/L (ref 19–32)
Calcium: 9.7 mg/dL (ref 8.4–10.5)
Creatinine, Ser: 0.69 mg/dL (ref 0.40–1.20)
GFR: 87.25 mL/min (ref >60.00)
GLUCOSE: 348 mg/dL — AB (ref 70–99)
POTASSIUM: 4.4 meq/L (ref 3.5–5.1)
Sodium: 138 mEq/L (ref 135–145)

## 2015-11-15 ENCOUNTER — Telehealth (HOSPITAL_COMMUNITY): Payer: Self-pay

## 2015-11-15 NOTE — Telephone Encounter (Signed)
Patient called CHF clinic triage line to verify pre cath instructions for tomorrow. All meds reviewed along with pre procedural instructions.  Destiny [name]

## 2015-11-16 ENCOUNTER — Ambulatory Visit (HOSPITAL_COMMUNITY)
Admission: RE | Admit: 2015-11-16 | Discharge: 2015-11-16 | Disposition: A | Discharge status: Home / Self Care | Payer: Commercial Managed Care - HMO | Source: Ambulatory Visit | Attending: Cardiology | Admitting: Cardiology

## 2015-11-16 ENCOUNTER — Other Ambulatory Visit: Payer: Self-pay | Admitting: Nurse Practitioner

## 2015-11-16 ENCOUNTER — Encounter (HOSPITAL_COMMUNITY)
Admission: RE | Disposition: A | Discharge status: Home / Self Care | Payer: Self-pay | Source: Ambulatory Visit | Attending: Cardiology

## 2015-11-16 DIAGNOSIS — Z7901 Long term (current) use of anticoagulants: Secondary | ICD-10-CM | POA: Diagnosis not present

## 2015-11-16 DIAGNOSIS — I110 Hypertensive heart disease with heart failure: Secondary | ICD-10-CM | POA: Insufficient documentation

## 2015-11-16 DIAGNOSIS — Z7984 Long term (current) use of oral hypoglycemic drugs: Secondary | ICD-10-CM | POA: Insufficient documentation

## 2015-11-16 DIAGNOSIS — Z87891 Personal history of nicotine dependence: Secondary | ICD-10-CM | POA: Diagnosis not present

## 2015-11-16 DIAGNOSIS — I429 Cardiomyopathy, unspecified: Secondary | ICD-10-CM | POA: Insufficient documentation

## 2015-11-16 DIAGNOSIS — I272 Other secondary pulmonary hypertension: Secondary | ICD-10-CM | POA: Diagnosis not present

## 2015-11-16 DIAGNOSIS — E119 Type 2 diabetes mellitus without complications: Secondary | ICD-10-CM | POA: Diagnosis not present

## 2015-11-16 DIAGNOSIS — I482 Chronic atrial fibrillation: Secondary | ICD-10-CM | POA: Diagnosis not present

## 2015-11-16 DIAGNOSIS — I509 Heart failure, unspecified: Secondary | ICD-10-CM | POA: Diagnosis not present

## 2015-11-16 DIAGNOSIS — I5022 Chronic systolic (congestive) heart failure: Secondary | ICD-10-CM | POA: Diagnosis present

## 2015-11-16 DIAGNOSIS — I422 Other hypertrophic cardiomyopathy: Secondary | ICD-10-CM

## 2015-11-16 DIAGNOSIS — I2510 Atherosclerotic heart disease of native coronary artery without angina pectoris: Secondary | ICD-10-CM | POA: Diagnosis not present

## 2015-11-16 HISTORY — PX: CARDIAC CATHETERIZATION: SHX172

## 2015-11-16 LAB — POCT I-STAT 3, VENOUS BLOOD GAS (G3P V)
ACID-BASE DEFICIT: 1 mmol/L (ref 0.0–2.0)
Acid-base deficit: 2 mmol/L (ref 0.0–2.0)
BICARBONATE: 24.6 meq/L — AB (ref 20.0–24.0)
BICARBONATE: 25.8 meq/L — AB (ref 20.0–24.0)
O2 Saturation: 71 %
O2 Saturation: 69 %
PCO2 VEN: 47.9 mmHg (ref 45.0–50.0)
PCO2 VEN: 51.9 mmHg — AB (ref 45.0–50.0)
PH VEN: 7.319 — AB (ref 7.250–7.300)
PO2 VEN: 40 mmHg (ref 30.0–45.0)
TCO2: 26 mmol/L (ref 0–100)
TCO2: 27 mmol/L (ref 0–100)
pH, Ven: 7.305 — ABNORMAL HIGH (ref 7.250–7.300)
pO2, Ven: 41 mmHg (ref 30.0–45.0)

## 2015-11-16 LAB — GLUCOSE, CAPILLARY
Glucose-Capillary: 181 mg/dL — ABNORMAL HIGH (ref 65–99)
Glucose-Capillary: 174 mg/dL — ABNORMAL HIGH (ref 65–99)

## 2015-11-16 LAB — PROTIME-INR
INR: 1.35 (ref 0.00–1.49)
PROTHROMBIN TIME: 16.8 s — AB (ref 11.6–15.2)

## 2015-11-16 SURGERY — RIGHT/LEFT HEART CATH AND CORONARY ANGIOGRAPHY

## 2015-11-16 MED ORDER — VERAPAMIL HCL 2.5 MG/ML IV SOLN
INTRAVENOUS | Status: AC
  Filled 2015-11-16: qty 2

## 2015-11-16 MED ORDER — ASPIRIN 81 MG PO CHEW
CHEWABLE_TABLET | ORAL | Status: AC
  Administered 2015-11-16: 81 mg via ORAL
  Filled 2015-11-16: qty 1

## 2015-11-16 MED ORDER — SODIUM CHLORIDE 0.9 % WEIGHT BASED INFUSION: 1.0000 mL/kg/h | INTRAVENOUS | Status: AC

## 2015-11-16 MED ORDER — ASPIRIN 81 MG PO CHEW
81.0000 mg | CHEWABLE_TABLET | ORAL | Status: AC
  Administered 2015-11-16: 81 mg via ORAL

## 2015-11-16 MED ORDER — MIDAZOLAM HCL 2 MG/2ML IJ SOLN
INTRAMUSCULAR | Status: AC
  Filled 2015-11-16: qty 2

## 2015-11-16 MED ORDER — SODIUM CHLORIDE 0.9% FLUSH: 3.0000 mL | Freq: Two times a day (BID) | INTRAVENOUS | Status: DC

## 2015-11-16 MED ORDER — SODIUM CHLORIDE 0.9% FLUSH: 3.0000 mL | INTRAVENOUS | Status: DC | PRN

## 2015-11-16 MED ORDER — HEPARIN (PORCINE) IN NACL 2-0.9 UNIT/ML-% IJ SOLN
INTRAMUSCULAR | Status: AC
  Filled 2015-11-16: qty 1000

## 2015-11-16 MED ORDER — FENTANYL CITRATE (PF) 100 MCG/2ML IJ SOLN
INTRAMUSCULAR | Status: AC
  Filled 2015-11-16: qty 2

## 2015-11-16 MED ORDER — MIDAZOLAM HCL 2 MG/2ML IJ SOLN
INTRAMUSCULAR | Status: DC | PRN
  Administered 2015-11-16 (×2): 1 mg via INTRAVENOUS

## 2015-11-16 MED ORDER — FENTANYL CITRATE (PF) 100 MCG/2ML IJ SOLN
INTRAMUSCULAR | Status: DC | PRN
  Administered 2015-11-16 (×2): 25 ug via INTRAVENOUS

## 2015-11-16 MED ORDER — LIDOCAINE HCL (PF) 1 % IJ SOLN
INTRAMUSCULAR | Status: DC | PRN
  Administered 2015-11-16: 20 mL
  Administered 2015-11-16 (×2): 2 mL

## 2015-11-16 MED ORDER — SODIUM CHLORIDE 0.9 % IV SOLN
INTRAVENOUS | Status: DC
  Administered 2015-11-16: 09:00:00 via INTRAVENOUS

## 2015-11-16 MED ORDER — LIDOCAINE HCL (PF) 1 % IJ SOLN
INTRAMUSCULAR | Status: AC
  Filled 2015-11-16: qty 30

## 2015-11-16 MED ORDER — SODIUM CHLORIDE 0.9 % IV SOLN: 250.0000 mL | INTRAVENOUS | Status: DC | PRN

## 2015-11-16 MED ORDER — ACETAMINOPHEN 325 MG PO TABS: 650.0000 mg | ORAL_TABLET | ORAL | Status: DC | PRN

## 2015-11-16 MED ORDER — HEPARIN (PORCINE) IN NACL 2-0.9 UNIT/ML-% IJ SOLN
INTRAMUSCULAR | Status: DC | PRN
  Administered 2015-11-16: 1500 mL

## 2015-11-16 MED ORDER — ONDANSETRON HCL 4 MG/2ML IJ SOLN: 4.0000 mg | Freq: Four times a day (QID) | INTRAMUSCULAR | Status: DC | PRN

## 2015-11-16 MED ORDER — IOHEXOL 350 MG/ML SOLN
INTRAVENOUS | Status: DC | PRN
  Administered 2015-11-16: 75 mL via INTRACARDIAC

## 2015-11-16 SURGICAL SUPPLY — 15 items
CATH BALLN WEDGE 5F 110CM (CATHETERS) ×3 IMPLANT
CATH INFINITI 5 FR MPA2 (CATHETERS) ×3 IMPLANT
CATH INFINITI 5FR MULTPACK ANG (CATHETERS) ×3 IMPLANT
DEVICE RAD COMP TR BAND LRG (VASCULAR PRODUCTS) ×3 IMPLANT
GLIDESHEATH SLEND SS 6F .021 (SHEATH) ×3 IMPLANT
GUIDEWIRE .025 260CM (WIRE) ×3 IMPLANT
KIT HEART LEFT (KITS) ×3 IMPLANT
PACK CARDIAC CATHETERIZATION (CUSTOM PROCEDURE TRAY) ×3 IMPLANT
SHEATH FAST CATH BRACH 5F 5CM (SHEATH) ×3 IMPLANT
SHEATH PINNACLE 5F 10CM (SHEATH) ×3 IMPLANT
SYR MEDRAD MARK V 150ML (SYRINGE) IMPLANT
TRANSDUCER W/STOPCOCK (MISCELLANEOUS) ×3 IMPLANT
TUBING ART PRESS 72  MALE/FEM (TUBING) ×2
TUBING ART PRESS 72 MALE/FEM (TUBING) ×1 IMPLANT
WIRE EMERALD 3MM-J .035X150CM (WIRE) ×3 IMPLANT

## 2015-11-16 NOTE — H&P (View-Only) (Signed)
Patient ID: Destiny [name], female   DOB: Jul 10, 1935, 81 y.o.   MRN: [id number] PCP: Dr Raoul Pitch Cardiology: Dr. Johnsie Cancel HF Cardiology: Dr. Aundra Dubin  81 yo with chronic atrial fibrillation and chronic systolic CHF presents for CHF clinic evaluation.  She has been in atrial fibrillation long-term.  However, she has been more short of breath with exertion since 9/15.  This has gradually worsened.  Echo in 3/15 actually showed normal EF.  Given increased dyspnea, echo was done in 3/16.  This showed EF 30-35%, newly decreased.  Her functional status has declined since then.  She is now short of breath walking 50-100 feet, making up her bed, carrying any load, and vacuuming.  She has orthopnea and PND regularly.  She had to stop 3 times walking into the office today.  No lightheadedness.  No syncope.  No palpitations.  HR is controlled. Lasix has helped some but not a lot. No chest pain.   Recent CXR showed pulmonary edema.  Labs (1/17): K 3.9, creatinine 0.63, BNP 198  ECG: atrial fibrillation, right superior axis  PMH: 1. HTN 2. DM type II 3. Chronic atrial fibrillation 4. Right TKR 12/10.  5. Chronic systolic CHF:  - Remote cath showed no significant CAD> - Echo (3/15) with EF 60-65%.  - Echo (3/16) with EF 30-35%, mild LVH, mild aortic stenosis mean gradient 13 mmHg, mild MR, RV mildly dilated with normal systolic function, moderate TR.   FH: Atrial fibrillation.  No CAD.  SH: Retired, widow, lives in [place], quit smoking 1989, originally from [place], has 7 kids.   ROS: All systems reviewed and negative except as per HPI.   Current Outpatient Prescriptions  Medication Sig Dispense Refill  . atorvastatin (LIPITOR) 20 MG tablet Take 0.5 tablets (10 mg total) by mouth daily at 6 PM. 45 tablet 3  . Calcium Carbonate-Vit D-Min 600-400 MG-UNIT TABS Take 1 tablet by mouth daily.     . carvedilol (COREG) 25 MG tablet Take 1 tablet (25 mg total) by mouth 2 (two) times daily with a meal. 180  tablet 3  . Cinnamon 500 MG capsule Take 500 mg by mouth daily.      . dorzolamide-timolol (COSOPT) 22.3-6.8 MG/ML ophthalmic solution Place 1 drop into both eyes 2 (two) times daily.     . furosemide (LASIX) 40 MG tablet Take 1.5 tabs in AM and 1 tab in pM 225 tablet 3  . glipiZIDE (GLUCOTROL XL) 10 MG 24 hr tablet Take 1 tablet (10 mg total) by mouth daily. 90 tablet 1  . glucose blood (ACCU-CHEK AVIVA PLUS) test strip Use as instructed 100 each 11  . KLOR-CON M20 20 MEQ tablet Take 2 tablets (40 mEq total) by mouth daily. 180 tablet 3  . latanoprost (XALATAN) 0.005 % ophthalmic solution Place 1 drop into both eyes at bedtime.     . magnesium oxide (MAG-OX) 400 MG tablet Take 400 mg by mouth daily.    . metFORMIN (GLUMETZA) 500 MG (MOD) 24 hr tablet Take 1,000 mg by mouth daily.     . Multiple Vitamins-Minerals (MULTIVITAL) tablet Take 1 tablet by mouth daily.      . Omega-3 Fatty Acids (FISH OIL) 1000 MG CAPS Take 1 capsule by mouth daily.     . vitamin E 400 UNIT capsule Take 400 Units by mouth daily.      Marland Kitchen warfarin (COUMADIN) 5 MG tablet Take 1 tablet (5 mg total) by mouth daily. Takes 2.5mg  on Monday, and 5mg   all other days. 90 tablet 3  . sacubitril-valsartan (ENTRESTO) 24-26 MG Take 1 tablet by mouth 2 (two) times daily. 60 tablet 3   No current facility-administered medications for this encounter.   BP 140/86 mmHg  Pulse 91  Wt 163 lb 4 oz (74.05 kg)  SpO2 95% General: NAD Neck: JVP 12 cm, no thyromegaly or thyroid nodule.  Lungs: Clear to auscultation bilaterally with normal respiratory effort. CV: Nondisplaced PMI.  Heart irregular S1/S2, no [id number], 3/6 systolic crescendo-decrescendo murmur RUSB with clear S2.  1+ edema to knees bilaterally.  No carotid bruit.  Normal pedal pulses.  Abdomen: Soft, nontender, no hepatosplenomegaly, no distention.  Skin: Intact without lesions or rashes.  Neurologic: Alert and oriented x 3.  Psych: Normal affect. Extremities: No clubbing or  cyanosis.  HEENT: Normal.   Assessment/Plan: 1. Chronic systolic CHF: Echo (0000000) with EF 30-35%, diffuse hypokinesis.  NYHA class IIIb symptoms.  She is very volume overloaded today.  Unsure etiology of cardiomyopathy => ?nonischemic given no chest pain and no clear ischemic event.  - Increase Lasix to 60 qam/40 qpm.  - Increase KCl to 40 daily.  - Stop lisinopril, after 36 hours, start Entresto 24/26 bid.   - BMET today and again in 2 wks.  - I think that she needs right and left heart cath => assess filling pressures/cardiac output and also assess for CAD as cause of cardiomyopathy.  We discussed the procedure today including risks/benefits.  She agrees to proceed.  I will have her hold warfarin 3 days prior to procedure.  2. Atrial fibrillation: Chronic.  Rate is controlled. She has been in atrial fibrillation for years, unlikely to be able to successfully cardiovert her. Continue warfarin.   Loralie Champagne 11/08/2015

## 2015-11-16 NOTE — Progress Notes (Signed)
Site area: right brachial venous sheath was removed  Site Prior to Removal:  Level 0  Pressure Applied For 10 MINUTES    Minutes Beginning at 1300p  Manual:   Yes.    Patient Status During Pull:  stable  Post Pull Groin Site:  Level 0  Post Pull Instructions Given:  Yes.    Post Pull Pulses Present:  Yes.    Dressing Applied:  Yes.    Comments:  VS remain stable during sheath pull

## 2015-11-16 NOTE — Interval H&P Note (Signed)
History and Physical Interval NoteCath Lab Visit (complete for each Cath Lab visit)  Clinical Evaluation Leading to the Procedure:   ACS: No.  Non-ACS:    Anginal Classification: CCS III  Anti-ischemic medical therapy: Minimal Therapy (1 class of medications)  Non-Invasive Test Results: No non-invasive testing performed  Prior CABG: No previous CABG      :  11/16/2015 11:01 AM  Destiny [name]  has presented today for surgery, with the diagnosis of hf  The various methods of treatment have been discussed with the patient and family. After consideration of risks, benefits and other options for treatment, the patient has consented to  Procedure(s): Right/Left Heart Cath and Coronary Angiography (N/A) as a surgical intervention .  The patient's history has been reviewed, patient examined, no change in status, stable for surgery.  I have reviewed the patient's chart and labs.  Questions were answered to the patient's satisfaction.     Milam Allbaugh [employer]

## 2015-11-16 NOTE — Discharge Instructions (Signed)
DO NOT RESTART METFORMIN FOR 48 HRS    Radial Site Care Refer to this sheet in the next few weeks. These instructions provide you with information about caring for yourself after your procedure. Your health care provider may also give you more specific instructions. Your treatment has been planned according to current medical practices, but problems sometimes occur. Call your health care provider if you have any problems or questions after your procedure. WHAT TO EXPECT AFTER THE PROCEDURE After your procedure, it is typical to have the following:  Bruising at the radial site that usually fades within 1-2 weeks.  Blood collecting in the tissue (hematoma) that may be painful to the touch. It should usually decrease in size and tenderness within 1-2 weeks. HOME CARE INSTRUCTIONS  Take medicines only as directed by your health care provider.  You may shower 24-48 hours after the procedure or as directed by your health care provider. Remove the bandage (dressing) and gently wash the site with plain soap and water. Pat the area dry with a clean towel. Do not rub the site, because this may cause bleeding.  Do not take baths, swim, or use a hot tub until your health care provider approves.  Check your insertion site every day for redness, swelling, or drainage.  Do not apply powder or lotion to the site.  Do not flex or bend the affected arm for 24 hours or as directed by your health care provider.  Do not push or pull heavy objects with the affected arm for 24 hours or as directed by your health care provider.  Do not lift over 10 lb (4.5 kg) for 5 days after your procedure or as directed by your health care provider.  Ask your health care provider when it is okay to:  Return to work or school.  Resume usual physical activities or sports.  Resume sexual activity.  Do not drive home if you are discharged the same day as the procedure. Have someone else drive you.  You may drive 24  hours after the procedure unless otherwise instructed by your health care provider.  Do not operate machinery or power tools for 24 hours after the procedure.  If your procedure was done as an outpatient procedure, which means that you went home the same day as your procedure, a responsible adult should be with you for the first 24 hours after you arrive home.  Keep all follow-up visits as directed by your health care provider. This is important. SEEK MEDICAL CARE IF:  You have a fever.  You have chills.  You have increased bleeding from the radial site. Hold pressure on the site. SEEK IMMEDIATE MEDICAL CARE IF:  You have unusual pain at the radial site.  You have redness, warmth, or swelling at the radial site.  You have drainage (other than a small amount of blood on the dressing) from the radial site.  The radial site is bleeding, and the bleeding does not stop after 30 minutes of holding steady pressure on the site.  Your arm or hand becomes pale, cool, tingly, or numb.   This information is not intended to replace advice given to you by your health care provider. Make sure you discuss any questions you have with your health care provider.   Document Released: 10/27/2010 Document Revised: 10/15/2014 Document Reviewed: 04/12/2014 Elsevier Interactive Patient Education 2016 [place] After Refer to this sheet in the next few weeks. These instructions provide  you with information about caring for yourself after your procedure. Your health care provider may also give you more specific instructions. Your treatment has been planned according to current medical practices, but problems sometimes occur. Call your health care provider if you have any problems or questions after your procedure. WHAT TO EXPECT AFTER THE PROCEDURE After your procedure, it is typical to have the following:  Bruising at the catheter insertion site that usually fades within 1-2  weeks.  Blood collecting in the tissue (hematoma) that may be painful to the touch. It should usually decrease in size and tenderness within 1-2 weeks. HOME CARE INSTRUCTIONS  Take medicines only as directed by your health care provider.  You may shower 24-48 hours after the procedure or as directed by your health care provider. Remove the bandage (dressing) and gently wash the site with plain soap and water. Pat the area dry with a clean towel. Do not rub the site, because this may cause bleeding.  Do not take baths, swim, or use a hot tub until your health care provider approves.  Check your insertion site every day for redness, swelling, or drainage.  Do not apply powder or lotion to the site.  Do not lift over 10 lb (4.5 kg) for 5 days after your procedure or as directed by your health care provider.  Ask your health care provider when it is okay to:  Return to work or school.  Resume usual physical activities or sports.  Resume sexual activity.  Do not drive home if you are discharged the same day as the procedure. Have someone else drive you.  You may drive 24 hours after the procedure unless otherwise instructed by your health care provider.  Do not operate machinery or power tools for 24 hours after the procedure or as directed by your health care provider.  If your procedure was done as an outpatient procedure, which means that you went home the same day as your procedure, a responsible adult should be with you for the first 24 hours after you arrive home.  Keep all follow-up visits as directed by your health care provider. This is important. SEEK MEDICAL CARE IF:  You have a fever.  You have chills.  You have increased bleeding from the catheter insertion site. Hold pressure on the site and call 911. SEEK IMMEDIATE MEDICAL CARE IF:  You have unusual pain at the catheter insertion site.  You have redness, warmth, or swelling at the catheter insertion site.  You  have drainage (other than a small amount of blood on the dressing) from the catheter insertion site.  The catheter insertion site is bleeding, and the bleeding does not stop after 30 minutes of holding steady pressure on the site.  The area near or just beyond the catheter insertion site becomes pale, cool, tingly, or numb.   This information is not intended to replace advice given to you by your health care provider. Make sure you discuss any questions you have with your health care provider.   Document Released: 04/12/2005 Document Revised: 10/15/2014 Document Reviewed: 02/25/2013 Elsevier Interactive Patient Education [employer].

## 2015-11-16 NOTE — Progress Notes (Signed)
Site area: Right groin a 5 french arterial sheath was removed  Site Prior to Removal:  Level 0  Pressure Applied For 15 MINUTES    Minutes Beginning at 1245p  Manual:   Yes.    Patient Status During Pull:  stable  Post Pull Groin Site:  Level 0  Post Pull Instructions Given:  Yes.    Post Pull Pulses Present:  Yes.    Dressing Applied:  Yes.    Comments:  VS remain stable during sheath pull

## 2015-11-17 ENCOUNTER — Encounter (HOSPITAL_COMMUNITY): Payer: Self-pay | Admitting: Cardiology

## 2015-11-17 ENCOUNTER — Telehealth: Payer: Self-pay | Admitting: Cardiovascular Disease

## 2015-11-17 MED FILL — Verapamil HCl IV Soln 2.5 MG/ML: INTRAVENOUS | Qty: 2 | Status: AC

## 2015-11-17 NOTE — Telephone Encounter (Signed)
error 

## 2015-11-17 NOTE — Telephone Encounter (Signed)
New message   Please call

## 2015-11-21 ENCOUNTER — Other Ambulatory Visit: Payer: Commercial Managed Care - HMO

## 2015-11-25 ENCOUNTER — Ambulatory Visit (HOSPITAL_COMMUNITY)
Admission: RE | Admit: 2015-11-25 | Discharge: 2015-11-25 | Disposition: A | Discharge status: Home / Self Care | Payer: Commercial Managed Care - HMO | Source: Ambulatory Visit | Attending: Nurse Practitioner | Admitting: Nurse Practitioner

## 2015-11-25 DIAGNOSIS — I272 Other secondary pulmonary hypertension: Secondary | ICD-10-CM | POA: Insufficient documentation

## 2015-11-25 DIAGNOSIS — I350 Nonrheumatic aortic (valve) stenosis: Secondary | ICD-10-CM | POA: Diagnosis not present

## 2015-11-25 DIAGNOSIS — E785 Hyperlipidemia, unspecified: Secondary | ICD-10-CM | POA: Insufficient documentation

## 2015-11-25 DIAGNOSIS — I119 Hypertensive heart disease without heart failure: Secondary | ICD-10-CM | POA: Diagnosis not present

## 2015-11-25 DIAGNOSIS — I429 Cardiomyopathy, unspecified: Secondary | ICD-10-CM | POA: Diagnosis present

## 2015-11-25 DIAGNOSIS — I340 Nonrheumatic mitral (valve) insufficiency: Secondary | ICD-10-CM | POA: Insufficient documentation

## 2015-11-25 DIAGNOSIS — I422 Other hypertrophic cardiomyopathy: Secondary | ICD-10-CM | POA: Diagnosis not present

## 2015-11-25 DIAGNOSIS — I071 Rheumatic tricuspid insufficiency: Secondary | ICD-10-CM | POA: Insufficient documentation

## 2015-11-25 DIAGNOSIS — E119 Type 2 diabetes mellitus without complications: Secondary | ICD-10-CM | POA: Insufficient documentation

## 2015-11-28 ENCOUNTER — Other Ambulatory Visit (HOSPITAL_COMMUNITY): Payer: Commercial Managed Care - HMO

## 2015-11-29 ENCOUNTER — Other Ambulatory Visit (HOSPITAL_COMMUNITY): Payer: Self-pay | Admitting: *Deleted

## 2015-11-29 ENCOUNTER — Encounter (HOSPITAL_COMMUNITY): Payer: Self-pay

## 2015-11-29 ENCOUNTER — Encounter (HOSPITAL_COMMUNITY): Payer: Self-pay | Admitting: *Deleted

## 2015-11-29 ENCOUNTER — Ambulatory Visit (HOSPITAL_COMMUNITY)
Admission: RE | Admit: 2015-11-29 | Discharge: 2015-11-29 | Disposition: A | Discharge status: Home / Self Care | Payer: Commercial Managed Care - HMO | Source: Ambulatory Visit | Attending: Cardiology | Admitting: Cardiology

## 2015-11-29 VITALS — BP 124/72 | HR 89 | Wt 155.0 lb

## 2015-11-29 DIAGNOSIS — I272 Other secondary pulmonary hypertension: Secondary | ICD-10-CM | POA: Diagnosis not present

## 2015-11-29 DIAGNOSIS — I110 Hypertensive heart disease with heart failure: Secondary | ICD-10-CM | POA: Diagnosis not present

## 2015-11-29 DIAGNOSIS — Z7984 Long term (current) use of oral hypoglycemic drugs: Secondary | ICD-10-CM | POA: Insufficient documentation

## 2015-11-29 DIAGNOSIS — I5022 Chronic systolic (congestive) heart failure: Secondary | ICD-10-CM | POA: Diagnosis present

## 2015-11-29 DIAGNOSIS — Z96651 Presence of right artificial knee joint: Secondary | ICD-10-CM | POA: Diagnosis not present

## 2015-11-29 DIAGNOSIS — I350 Nonrheumatic aortic (valve) stenosis: Secondary | ICD-10-CM | POA: Diagnosis not present

## 2015-11-29 DIAGNOSIS — I482 Chronic atrial fibrillation, unspecified: Secondary | ICD-10-CM

## 2015-11-29 DIAGNOSIS — E119 Type 2 diabetes mellitus without complications: Secondary | ICD-10-CM | POA: Insufficient documentation

## 2015-11-29 DIAGNOSIS — I5042 Chronic combined systolic (congestive) and diastolic (congestive) heart failure: Secondary | ICD-10-CM

## 2015-11-29 DIAGNOSIS — Z87891 Personal history of nicotine dependence: Secondary | ICD-10-CM | POA: Diagnosis not present

## 2015-11-29 DIAGNOSIS — I428 Other cardiomyopathies: Secondary | ICD-10-CM | POA: Insufficient documentation

## 2015-11-29 DIAGNOSIS — Z7901 Long term (current) use of anticoagulants: Secondary | ICD-10-CM | POA: Diagnosis not present

## 2015-11-29 DIAGNOSIS — Z79899 Other long term (current) drug therapy: Secondary | ICD-10-CM | POA: Insufficient documentation

## 2015-11-29 MED ORDER — SACUBITRIL-VALSARTAN 24-26 MG PO TABS: 1.0000 | ORAL_TABLET | Freq: Two times a day (BID) | ORAL | Status: DC

## 2015-11-29 MED ORDER — SPIRONOLACTONE 25 MG PO TABS: 12.5000 mg | ORAL_TABLET | Freq: Every day | ORAL | Status: DC

## 2015-11-29 NOTE — Patient Instructions (Signed)
Start Spironolactone 12.5 mg (1/2 tab) daily  Labs in 1 week, will do 3/2 while you are at the hospital  Your physician has requested that you have a TEE. During a TEE, sound waves are used to create images of your heart. It provides your doctor with information about the size and shape of your heart and how well your heart's chambers and valves are working. In this test, a transducer is attached to the end of a flexible tube that's guided down your throat and into your esophagus (the tube leading from you mouth to your stomach) to get a more detailed image of your heart. You are not awake for the procedure. Please see the instruction sheet given to you today. For further information please visit [email or web address].  Your physician recommends that you schedule a follow-up appointment in: 1 month

## 2015-11-29 NOTE — Progress Notes (Signed)
Patient ID: HARLEY ANDRESEN, female   DOB: [birth year], 81 y.o.   MRN: [id number]   Aicia is seen today for F/U chronic afib, She has HTN and lower extremity edema from varicosities. Worsening CHF past year  Echo 12/28/14 with EF 30-35% mild AS mean gradient 13 mmHg mild MR   Cardizem and digoxen d/c due to bradycardia  Insulin dose also lowered   Seen by Dr Aundra Dubin CHF clinic and diuretics adjusted started on Entresto  Right and left cath done  Normal coronary arteries , mean PCWP 15 no pulmonary HTN.  Indicated normal EF by V gram but frequent PVCls  F/U echo 2/17 with better EF but ? Severe AS Valve was not that hard to cross at cath according to Dr Aundra Dubin and peak to peak gradient only 18 mmHg  Study Conclusions  - Left ventricle: The cavity size was normal. There was moderate concentric hypertrophy. Systolic function was mildly to moderately reduced. The estimated ejection fraction was in the range of 40% to 45%. Wall motion was normal; there were no regional wall motion abnormalities. Doppler parameters are consistent with restrictive physiology, indicative of decreased left ventricular diastolic compliance and/or increased left atrial pressure. Doppler parameters are consistent with elevated ventricular end-diastolic filling pressure. - Aortic valve: Valve mobility was restricted. Peak gradient (S): 78 mm Hg. Valve area (VTI): 0.81 cm^2. Valve area (Vmax): 0.89 cm^2. Valve area (Vmean): 1.04 cm^2. - Mitral valve: Calcified annulus. Moderately thickened, mildly calcified leaflets . There was mild regurgitation. - Left atrium: The atrium was severely dilated. - Right atrium: The atrium was severely dilated. - Tricuspid valve: There was moderate regurgitation. - Pulmonary arteries: Systolic pressure was moderately increased. PA peak pressure: 45 mm Hg (S). - Inferior vena cava: The vessel was normal in size. - Pericardium, extracardiac: There was no  pericardial effusion.  Impressions:  - When compared to the prior study from 01/06/2015 LVEF is now improved. Aortic stenosis is severe. There is moderate pulmonary hypertension.   ProBNP (last 3 results)  Recent Labs  12/30/14 1143 03/03/15 1522 04/20/15 1505  PROBNP 401.0* 362.0* 352.0*   Lab Results  Component Value Date   CREATININE 0.69 11/14/2015   BUN 17 11/14/2015   NA 138 11/14/2015   K 4.4 11/14/2015   CL 99 11/14/2015   CO2 31 11/14/2015      ROS: Denies fever, malais, weight loss, blurry vision, decreased visual acuity, cough, sputum, SOB, hemoptysis, pleuritic pain, palpitaitons, heartburn, abdominal pain, melena, lower extremity edema, claudication, or rash.  All other systems reviewed and negative  General: Affect appropriate Chronically ill white female  HEENT: normal Neck supple with no adenopathy JVP normal no bruits no thyromegaly Lungs clear  and good diaphragmatic motion Heart:  S1/S2 AS  murmur, no rub, gallop or click PMI normal Abdomen: benighn, BS positve, no tenderness, no AAA no bruit.  No HSM or HJR Distal pulses intact with no bruits Plus one bilateral edema with varicose veins  Neuro non-focal Skin warm and dry  Basal cell over left temporal area  No muscular weakness   Current Outpatient Prescriptions  Medication Sig Dispense Refill  . atorvastatin (LIPITOR) 20 MG tablet Take 0.5 tablets (10 mg total) by mouth daily at 6 PM. 45 tablet 3  . Calcium Carbonate-Vit D-Min 600-400 MG-UNIT TABS Take 1 tablet by mouth daily.     . carvedilol (COREG) 25 MG tablet Take 1 tablet (25 mg total) by mouth 2 (two) times daily with a meal. 180  tablet 3  . Cinnamon 500 MG capsule Take 500 mg by mouth daily.      . dorzolamide-timolol (COSOPT) 22.3-6.8 MG/ML ophthalmic solution Place 1 drop into both eyes 2 (two) times daily.     . furosemide (LASIX) 40 MG tablet Take 1.5 tabs in AM and 1 tab in pM 225 tablet 3  . glipiZIDE (GLUCOTROL XL) 10 MG  24 hr tablet Take 1 tablet (10 mg total) by mouth daily. 90 tablet 1  . glucose blood (ACCU-CHEK AVIVA PLUS) test strip Use as instructed 100 each 11  . KLOR-CON M20 20 MEQ tablet Take 2 tablets (40 mEq total) by mouth daily. 180 tablet 3  . latanoprost (XALATAN) 0.005 % ophthalmic solution Place 1 drop into both eyes at bedtime.     . magnesium oxide (MAG-OX) 400 MG tablet Take 400 mg by mouth daily.    . metFORMIN (GLUMETZA) 500 MG (MOD) 24 hr tablet Take 1,000 mg by mouth daily.     . Multiple Vitamins-Minerals (MULTIVITAL) tablet Take 1 tablet by mouth daily.      . Omega-3 Fatty Acids (FISH OIL) 1000 MG CAPS Take 1 capsule by mouth daily.     . sacubitril-valsartan (ENTRESTO) 24-26 MG Take 1 tablet by mouth 2 (two) times daily. 60 tablet 3  . vitamin E 400 UNIT capsule Take 400 Units by mouth daily.      Marland Kitchen warfarin (COUMADIN) 5 MG tablet Take 1 tablet (5 mg total) by mouth daily. Takes 2.5mg  on Monday, and 5mg  all other days. (Patient taking differently: Take 5 mg by mouth daily. Takes 2.5mg  on Monday,wednesday and friday and 5mg  all other days.) 90 tablet 3   No current facility-administered medications for this visit.    Allergies  Review of patient's allergies indicates no known allergies.  Electrocardiogram:  06/2014  afib rate 56  LVH with strain  10/20/15  afib rate 95 nonspecific ST changes   Assessment and Plan CHF:    Afib:  Good rate control and anticoagulation.  INR with Dr Maceo Pro 4 weeks  AS:  Jenkins Rouge

## 2015-11-30 DIAGNOSIS — I350 Nonrheumatic aortic (valve) stenosis: Secondary | ICD-10-CM | POA: Insufficient documentation

## 2015-11-30 NOTE — Progress Notes (Signed)
Patient ID: Destiny [name], female   DOB: 1935-04-10, 81 y.o.   MRN: [id number] PCP: Dr Raoul Pitch Cardiology: Dr. Johnsie Cancel HF Cardiology: Dr. Aundra Dubin  81 yo with chronic atrial fibrillation and chronic systolic CHF presents for CHF clinic followup.  She has been in atrial fibrillation long-term.  However, she had been more short of breath with exertion since 9/15.  This gradually worsened.  Echo in 3/15 actually showed normal EF.  Given increased dyspnea, echo was done in 3/16.  This showed EF 30-35%, newly decreased.  At last appointment, she was short of breath walking 50-100 feet, making up her bed, carrying any load, and vacuuming.    I thought she was volume overloaded and increased Lasix/started Entresto. Weight is down 8 lbs.  She is doing much better.  Able to walk her dog twice a day without problems.  Mild dyspnea walking up steps.  No orthopnea/PND. No lightheadedness.    I did right and left heart cath.  This showed no significant CAD and normal right and left heart filling pressures.  There was mild pulmonary arterial HTN.  Echo was done also, showing EF 40-45% (improved) with moderate to severe aortic stenosis.   Labs (1/17): K 3.9, creatinine 0.63, BNP 198 Labs (2/17): K 4.4, creatinine 0.69  ECG: atrial fibrillation, right superior axis  PMH: 1. HTN 2. DM type II 3. Chronic atrial fibrillation 4. Right TKR 12/10.  5. Chronic systolic CHF:  - Remote cath showed no significant CAD> - Echo (3/15) with EF 60-65%.  - Echo (3/16) with EF 30-35%, mild LVH, mild aortic stenosis mean gradient 13 mmHg, mild MR, RV mildly dilated with normal systolic function, moderate TR.  - LHC/RHC (2/17): minimal luminal irregularities in coronaries.  Mean RA 4, PA 57/22, PCWP mean 15, PVR 2.7 WU, CI 3.29. Peak-to-peak aortic valve gradient 21 mmHg.  - Echo (2/17) with EF 40-45%, moderate LV hypertrophy, mild MR, normal RV size and systolic function, PASP 45 mmHg, moderate TR, moderate-severe aortic  stenosis with mean gradient 30 mmHg, peak 78 mmHg, AVA 0.8 cm^2.  6. Aortic stenosis: Moderate to severe by echo but poorly visualized.   FH: Atrial fibrillation.  No CAD.  SH: Retired, widow, lives in [place], quit smoking 1989, originally from [place], has 7 kids.   ROS: All systems reviewed and negative except as per HPI.   Current Outpatient Prescriptions  Medication Sig Dispense Refill  . atorvastatin (LIPITOR) 20 MG tablet Take 0.5 tablets (10 mg total) by mouth daily at 6 PM. 45 tablet 3  . Calcium Carbonate-Vit D-Min 600-400 MG-UNIT TABS Take 1 tablet by mouth daily.     . carvedilol (COREG) 25 MG tablet Take 1 tablet (25 mg total) by mouth 2 (two) times daily with a meal. 180 tablet 3  . Cinnamon 500 MG capsule Take 500 mg by mouth daily.      . dorzolamide-timolol (COSOPT) 22.3-6.8 MG/ML ophthalmic solution Place 1 drop into both eyes 2 (two) times daily.     . furosemide (LASIX) 40 MG tablet Take 1.5 tabs in AM and 1 tab in pM 225 tablet 3  . glipiZIDE (GLUCOTROL XL) 10 MG 24 hr tablet Take 1 tablet (10 mg total) by mouth daily. 90 tablet 1  . glucose blood (ACCU-CHEK AVIVA PLUS) test strip Use as instructed 100 each 11  . KLOR-CON M20 20 MEQ tablet Take 2 tablets (40 mEq total) by mouth daily. 180 tablet 3  . latanoprost (XALATAN) 0.005 % ophthalmic solution  Place 1 drop into both eyes at bedtime.     . magnesium oxide (MAG-OX) 400 MG tablet Take 400 mg by mouth daily.    . metFORMIN (GLUMETZA) 500 MG (MOD) 24 hr tablet Take 1,000 mg by mouth daily.     . Multiple Vitamins-Minerals (MULTIVITAL) tablet Take 1 tablet by mouth daily.      . Omega-3 Fatty Acids (FISH OIL) 1000 MG CAPS Take 1 capsule by mouth daily.     . sacubitril-valsartan (ENTRESTO) 24-26 MG Take 1 tablet by mouth 2 (two) times daily. 60 tablet 3  . vitamin E 400 UNIT capsule Take 400 Units by mouth daily.      Marland Kitchen warfarin (COUMADIN) 5 MG tablet Take 1 tablet (5 mg total) by mouth daily. Takes 2.5mg  on Monday,  and 5mg  all other days. (Patient taking differently: Take 5 mg by mouth daily. Takes 2.5mg  on Monday,wednesday and friday and 5mg  all other days.) 90 tablet 3  . spironolactone (ALDACTONE) 25 MG tablet Take 0.5 tablets (12.5 mg total) by mouth daily. 15 tablet 3   No current facility-administered medications for this encounter.   BP 124/72 mmHg  Pulse 89  Wt 155 lb (70.308 kg)  SpO2 96% General: NAD Neck: JVP 7 cm, no thyromegaly or thyroid nodule.  Lungs: Clear to auscultation bilaterally with normal respiratory effort. CV: Nondisplaced PMI.  Heart irregular S1/S2, no [id number], 3/6 systolic crescendo-decrescendo murmur RUSB with S2 heard clearly.  No edema.  No carotid bruit.  Normal pedal pulses.  Abdomen: Soft, nontender, no hepatosplenomegaly, no distention.  Skin: Intact without lesions or rashes.  Neurologic: Alert and oriented x 3.  Psych: Normal affect. Extremities: No clubbing or cyanosis.  HEENT: Normal.   Assessment/Plan: 1. Chronic systolic CHF: Echo (0000000) with EF 30-35%, diffuse hypokinesis, improved to 40-45% on 2/17 echo.  Nonischemic cardiomyopathy, no significant coronary disease on angiography.  Well-compensated left and right-sided filling pressures on current Lasix.  - Continue Lasix 60 qam/40 qpm and KCl 40 daily.  - Continue Entresto and Coreg.  - Start spironolactone 12.5 mg daily with BMET in 10 days.   2. Atrial fibrillation: Chronic.  Rate is controlled. She has been in atrial fibrillation for years, unlikely to be able to successfully cardiovert her. Continue warfarin.  3. Aortic stenosis: Moderate to severe on echo from 2/17.  Severe by AVA, moderate by mean gradient.  By cath, suspect moderate given relative ease of crossing valve. However, if she does have severe AS, I would be concerned that hemodynamically significant AS could be behind her fall in EF.  I think we need more definitive evaluation of the aortic valve.   - I will arrange for TEE to look at aortic  valve.  I discussed risks/benefits of procedure with the patient, and she agrees to proceed.   4. Pulmonary hypertension: Mild to moderate pulmonary hypertension on RHC but PVR not significantly elevated.  I think a sleep study would be reasonable given pulmonary hypertension.   Loralie Champagne 11/30/2015

## 2015-12-01 ENCOUNTER — Telehealth (HOSPITAL_COMMUNITY): Payer: Self-pay | Admitting: *Deleted

## 2015-12-01 NOTE — Telephone Encounter (Signed)
Pre cert approval [id number] TEE 3/2

## 2015-12-01 NOTE — Telephone Encounter (Signed)
No precert reqd for TEE 3/2 per [facility] gold

## 2015-12-02 ENCOUNTER — Encounter: Payer: Self-pay | Admitting: Family Medicine

## 2015-12-02 ENCOUNTER — Ambulatory Visit (INDEPENDENT_AMBULATORY_CARE_PROVIDER_SITE_OTHER): Payer: Commercial Managed Care - HMO | Admitting: Family Medicine

## 2015-12-02 VITALS — BP 132/90 | HR 84 | Temp 98.0°F | Resp 20 | Wt 157.0 lb

## 2015-12-02 DIAGNOSIS — E119 Type 2 diabetes mellitus without complications: Secondary | ICD-10-CM | POA: Diagnosis not present

## 2015-12-02 LAB — POCT GLYCOSYLATED HEMOGLOBIN (HGB A1C): Hemoglobin A1C: 8.4

## 2015-12-02 MED ORDER — LINAGLIPTIN 5 MG PO TABS: 5.0000 mg | ORAL_TABLET | Freq: Every day | ORAL | Status: DC

## 2015-12-02 NOTE — Patient Instructions (Signed)
Diabetes and Foot Care Diabetes may cause you to have problems because of poor blood supply (circulation) to your feet and legs. This may cause the skin on your feet to become thinner, break easier, and heal more slowly. Your skin may become dry, and the skin may peel and crack. You may also have nerve damage in your legs and feet causing decreased feeling in them. You may not notice minor injuries to your feet that could lead to infections or more serious problems. Taking care of your feet is one of the most important things you can do for yourself.  HOME CARE INSTRUCTIONS  Wear shoes at all times, even in the house. Do not go barefoot. Bare feet are easily injured.  Check your feet daily for blisters, cuts, and redness. If you cannot see the bottom of your feet, use a mirror or ask someone for help.  Wash your feet with warm water (do not use hot water) and mild soap. Then pat your feet and the areas between your toes until they are completely dry. Do not soak your feet as this can dry your skin.  Apply a moisturizing lotion or petroleum jelly (that does not contain alcohol and is unscented) to the skin on your feet and to dry, brittle toenails. Do not apply lotion between your toes.  Trim your toenails straight across. Do not dig under them or around the cuticle. File the edges of your nails with an emery board or nail file.  Do not cut corns or calluses or try to remove them with medicine.  Wear clean socks or stockings every day. Make sure they are not too tight. Do not wear knee-high stockings since they may decrease blood flow to your legs.  Wear shoes that fit properly and have enough cushioning. To break in new shoes, wear them for just a few hours a day. This prevents you from injuring your feet. Always look in your shoes before you put them on to be sure there are no objects inside.  Do not cross your legs. This may decrease the blood flow to your feet.  If you find a minor scrape,  cut, or break in the skin on your feet, keep it and the skin around it clean and dry. These areas may be cleansed with mild soap and water. Do not cleanse the area with peroxide, alcohol, or iodine.  When you remove an adhesive bandage, be sure not to damage the skin around it.  If you have a wound, look at it several times a day to make sure it is healing.  Do not use heating pads or hot water bottles. They may burn your skin. If you have lost feeling in your feet or legs, you may not know it is happening until it is too late.  Make sure your health care provider performs a complete foot exam at least annually or more often if you have foot problems. Report any cuts, sores, or bruises to your health care provider immediately. SEEK MEDICAL CARE IF:   You have an injury that is not healing.  You have cuts or breaks in the skin.  You have an ingrown nail.  You notice redness on your legs or feet.  You feel burning or tingling in your legs or feet.  You have pain or cramps in your legs and feet.  Your legs or feet are numb.  Your feet always feel cold. SEEK IMMEDIATE MEDICAL CARE IF:   There is increasing redness,   swelling, or pain in or around a wound.  There is a red line that goes up your leg.  Pus is coming from a wound.  You develop a fever or as directed by your health care provider.  You notice a bad smell coming from an ulcer or wound.   This information is not intended to replace advice given to you by your health care provider. Make sure you discuss any questions you have with your health care provider.   Document Released: 09/21/2000 Document Revised: 05/27/2013 Document Reviewed: 03/03/2013 Elsevier Interactive Patient Education 2016 [employer]vier Inc[employer]  

## 2015-12-02 NOTE — Progress Notes (Signed)
Patient ID: Destiny [name], female   DOB: Jul 16, 1935, 81 y.o.   MRN: [id number]   Subjective:    Patient ID: Destiny [name], female    DOB: 12/27/1934, 81 y.o.   MRN: [id number]  HPI  Patient presents for follow-up on diabetes.  Diabetes: He had an unknown A1c, after establishing care last month. A1c today is 8.4, she had said that her prior A1c was 6.1. Patient states after restarting her metformin (after cardiac cath) she has noticed that the metformin started upsetting her stomach. Upon establishment and patient wasn't certain what medication she was taking, she is prescribed metformin and glipizide and was taking Tradjenta, when she could get free samples. Patient's A1c today is elevated, patient doesn't seem to know if or how much Tradjenta she was taking in December and January. She does bring with her fasting glucoses daily, and the month of February is a higher ranges in her prior months. Prior months or 89-129 fasting glucose, and currently her ranges are 121-226 fasting glucose. Patient does not desire insulin therapy. Patient denies any hypo-or hyperglycemic events, she denies any new numbness or tingling in her extremities. She reports being up-to-date with her ophthalmology exam. Patient states her last A1c was 6.1. Patient brings her glucose log with her today which range from 89-129 fasting glucose. GFR 87.   Cardiac: Patient has a history of carotid bruit, cardiac murmur, hypertension, atrial fibrillation, chronic combined systolic and diastolic congestive heart failure. Medication alterations at last cardic appt:increased lasix, spiro, dc lisinopril, added entresto. Down 9-10 lbs. Feels better.  TEE scheduled for 11/29/2015 Chronic Coumadin use.  INR completed by cardio last  Patient receiving cardiac medications per cardiology. She states that she is taking   Recent BNP 197.6 on 10/20/2015.  EKG: 06/2014 A. fib rate 56 LVH with strain.  EKG 10/20/2015: A. fib rate 95,  nonspecific ST changes.  Heart cath 11/16/2015: 1. No significant CAD. 2. Near normal left and right heart filling pressures. Mild pulmonary hypertension with relatively low PVR, suggesting primarily pulmonary venous hypertension.  3. Preserved cardiac output.  4. EF appears improved, will need to confirm with echo.  ECHO 11/25/2015: - Left ventricle: The cavity size was normal. There was moderate concentric hypertrophy. Systolic function was mildly to moderately reduced. The estimated ejection fraction was in the range of 40% to 45%. Wall motion was normal; there were no regional wall motion abnormalities. Doppler parameters are consistent with restrictive physiology, indicative of decreased left ventricular diastolic compliance and/or increased left atrial pressure. Doppler parameters are consistent with elevated ventricular end-diastolic filling pressure. - Aortic valve: Valve mobility was restricted. Peak gradient (S): 78 mm Hg. Valve area (VTI): 0.81 cm^2. Valve area (Vmax): 0.89 cm^2. Valve area (Vmean): 1.04 cm^2. - Mitral valve: Calcified annulus. Moderately thickened, mildly calcified leaflets . There was mild regurgitation. - Left atrium: The atrium was severely dilated. - Right atrium: The atrium was severely dilated. - Tricuspid valve: There was moderate regurgitation. - Pulmonary arteries: Systolic pressure was moderately increased. PA peak pressure: 45 mm Hg (S). - Inferior vena cava: The vessel was normal in size. - Pericardium, extracardiac: There was no pericardial effusion.  Impressions: - When compared to the prior study from 01/06/2015 LVEF is now improved. Aortic stenosis is severe. There is moderate pulmonary hypertension.  Echo 01/06/2015:  Study Conclusions - Left ventricle: The cavity size was normal. There was mild concentric hypertrophy. Systolic function was moderately to severely reduced. The estimated ejection  fraction was in  the range of 30% to 35%. Wall motion was normal; there were no regional wall motion abnormalities. Doppler parameters are consistent with a reversible restrictive pattern, indicative of decreased left ventricular diastolic compliance and/or increased left atrial pressure (grade 3 diastolic dysfunction). - Aortic valve: Fixed non coronary cusp. There was mild stenosis. It is possible that gradient is more severe than measured secondary to reduced stroke volume in the setting of reduced EF. Mean gradient (S): 13 mm Hg. - Mitral valve: There was mild regurgitation directed posteriorly. - Left atrium: The atrium was severely . - Right ventricle: The cavity size was mildly dilated. Wall thickness was normal. - Right atrium: The atrium was severely dilated. - Tricuspid valve: There was moderate regurgitation.  Past Medical History  Diagnosis Date  . Mitral valve insufficiency and aortic valve insufficiency   . Unspecified essential hypertension   . Mononeuritis of unspecified site   . Edema   . Calculus of kidney   . Unspecified glaucoma   . Pure hypercholesterolemia   . Osteoarthrosis, unspecified whether generalized or localized, unspecified site   . Atrial fibrillation ([place]) 1999-diagnosed  . Diabetes mellitus without mention of complication   . Complication of anesthesia     slow to awaken  . Diastolic CHF, chronic ([place]) 12/30/2014  . Cataract     left eye (removed)   No Known Allergies Past Surgical History  Procedure Laterality Date  . Catarct surgery Left   . Tonsillectomy  1943  . Appendectomy  1954  . Abdominal hysterectomy  2000    partial  . Transesophageal echocardiogram  02/2012  . Holmium laser application Right [id number]    Procedure: HOLMIUM LASER APPLICATION;  Surgeon: Alexis Frock, MD;  [name];  Service: Urology;  Laterality: Right;  . Cystoscopy w/ retrogrades Right 11/26/2012    Procedure:  CYSTOSCOPY WITH RETROGRADE PYELOGRAM;  Surgeon: Alexis Frock, MD;  Location: [facility];  Service: Urology;  Laterality: Right;  . Joint replacement Right 2009    knee  . Kidney stone      ? Dr Tammi Klippel  . Cardiac catheterization N/A 11/16/2015    Procedure: Right/Left Heart Cath and Coronary Angiography;  Surgeon: Larey Dresser, MD;  Location: [facility];  Service: Cardiovascular;  Laterality: N/A;   Family History  Problem Relation Age of Onset  . Cancer Mother 92    leukemia   . Breast cancer Neg Hx   . Colon cancer Neg Hx    Social History   Social History  . Marital Status: Widowed    Spouse Name: N/A  . Number of Children: N/A  . Years of Education: N/A   Occupational History  . Not on file.   Social History Main Topics  . Smoking status: Former Smoker    Quit date: 07/11/1988  . Smokeless tobacco: Never Used  . Alcohol Use: Yes     Comment: occasional  . Drug Use: No  . Sexual Activity: No   Other Topics Concern  . Not on file   Social History Narrative   Widow. Lives alone. Attended business college.   Lives in one story home. Has living will.    Walker and/or four pronged cane for assistance.    Former smoker of 29 years. Quit smoking 1989.   Patient drinks caffeinated beverages, takes a daily vitamin.   Wears her seatbelt, wears dentures, smoke detector located in the home.       Review of Systems Negative,  with the exception of above mentioned in HPI    Objective:   Physical Exam BP 132/90 mmHg  Pulse 84  Temp(Src) 98 F (36.7 C)  Resp 20  Wt 157 lb (71.215 kg)  SpO2 94% Gen: Afebrile. No acute distress. Nontoxic in appearance, well-developed, well-nourished, Caucasian female, present with 4 prong cane. HENT: AT. [place].  MMM, no oral lesions.  Eyes:Pupils Equal Round Reactive to light, Extraocular movements intact,  Conjunctiva without redness, discharge or icterus. Neck/lymp/endocrine: Supple, no lymphadenopathy, no  thyromegaly CV: RRR 2/6 systolic murmur appreciated, no r/c/g, no edema, diminished but equal pulses bilateral posterior tibialis.  Chest: CTAB, no wheeze or crackles. Normal respiratory effort. Moderate air movement. Abd: Soft. Obese. NTND. BS present. No Masses palpated.  Neuro: Slow gait, with cane assistance. PERLA. EOMi. Alert. Oriented x3.    Assessment & Plan:  Destiny [name] is a 81 y.o. female presents for f/u diabetes. 1. Diabetes mellitus without complication ([place]) - POCT HgB A1C--> ABOVE goal. Pt has been a poor historian surrounding her piror diabetic regimen. Prior reported a1c of 6.1 was likely from the tradjenta use.  - Continue metformin and glipizide. - pt to monitor fasting sugars daily. If <90 or >150 routinely will need to see.  - linagliptin (TRADJENTA) 5 MG TABS tablet; Take 1 tablet (5 mg total) by mouth daily.  Dispense: 30 tablet; Refill: 5 - F/U 3 months with a1c prior to room.

## 2015-12-05 ENCOUNTER — Encounter: Payer: Commercial Managed Care - HMO | Admitting: Cardiovascular Disease

## 2015-12-05 ENCOUNTER — Telehealth (HOSPITAL_COMMUNITY): Payer: Self-pay | Admitting: Pharmacist

## 2015-12-05 ENCOUNTER — Other Ambulatory Visit: Payer: Self-pay | Admitting: *Deleted

## 2015-12-05 MED ORDER — METFORMIN HCL ER (MOD) 500 MG PO TB24: 1000.0000 mg | ORAL_TABLET | Freq: Every day | ORAL | Status: DC

## 2015-12-05 NOTE — Telephone Encounter (Signed)
Pharmacy request for patient Metformin Er . Last seen 12/02/15. We have not Rx"d this before.

## 2015-12-05 NOTE — Telephone Encounter (Signed)
Entresto 24-26 mg PA approved by [facility] through 12/02/17.   [name]. Velva Harman, PharmD, BCPS, CPP Clinical Pharmacist Pager: [phone] Phone: [phone] 12/05/2015 12:24 PM

## 2015-12-07 ENCOUNTER — Other Ambulatory Visit: Payer: Self-pay | Admitting: Family Medicine

## 2015-12-07 ENCOUNTER — Ambulatory Visit: Payer: Commercial Managed Care - HMO | Admitting: Family Medicine

## 2015-12-07 MED ORDER — METFORMIN HCL ER 500 MG PO TB24: 1000.0000 mg | ORAL_TABLET | Freq: Every day | ORAL | Status: DC

## 2015-12-08 ENCOUNTER — Encounter (HOSPITAL_COMMUNITY): Payer: Self-pay | Admitting: *Deleted

## 2015-12-08 ENCOUNTER — Encounter (HOSPITAL_COMMUNITY)
Admission: RE | Disposition: A | Discharge status: Home / Self Care | Payer: Self-pay | Source: Ambulatory Visit | Attending: Cardiology

## 2015-12-08 ENCOUNTER — Ambulatory Visit (HOSPITAL_COMMUNITY)
Admission: RE | Admit: 2015-12-08 | Discharge: 2015-12-08 | Disposition: A | Discharge status: Home / Self Care | Payer: Commercial Managed Care - HMO | Source: Ambulatory Visit | Attending: Cardiology | Admitting: Cardiology

## 2015-12-08 ENCOUNTER — Ambulatory Visit (HOSPITAL_BASED_OUTPATIENT_CLINIC_OR_DEPARTMENT_OTHER)
Admission: RE | Admit: 2015-12-08 | Discharge: 2015-12-08 | Disposition: A | Discharge status: Home / Self Care | Payer: Commercial Managed Care - HMO | Source: Ambulatory Visit | Attending: Cardiology | Admitting: Cardiology

## 2015-12-08 DIAGNOSIS — I110 Hypertensive heart disease with heart failure: Secondary | ICD-10-CM | POA: Diagnosis not present

## 2015-12-08 DIAGNOSIS — Z79899 Other long term (current) drug therapy: Secondary | ICD-10-CM | POA: Insufficient documentation

## 2015-12-08 DIAGNOSIS — Z7984 Long term (current) use of oral hypoglycemic drugs: Secondary | ICD-10-CM | POA: Diagnosis not present

## 2015-12-08 DIAGNOSIS — I272 Other secondary pulmonary hypertension: Secondary | ICD-10-CM | POA: Insufficient documentation

## 2015-12-08 DIAGNOSIS — I350 Nonrheumatic aortic (valve) stenosis: Secondary | ICD-10-CM

## 2015-12-08 DIAGNOSIS — Z96651 Presence of right artificial knee joint: Secondary | ICD-10-CM | POA: Diagnosis not present

## 2015-12-08 DIAGNOSIS — Z87891 Personal history of nicotine dependence: Secondary | ICD-10-CM | POA: Insufficient documentation

## 2015-12-08 DIAGNOSIS — E119 Type 2 diabetes mellitus without complications: Secondary | ICD-10-CM | POA: Insufficient documentation

## 2015-12-08 DIAGNOSIS — I482 Chronic atrial fibrillation: Secondary | ICD-10-CM | POA: Insufficient documentation

## 2015-12-08 DIAGNOSIS — I5022 Chronic systolic (congestive) heart failure: Secondary | ICD-10-CM | POA: Diagnosis not present

## 2015-12-08 DIAGNOSIS — Z7901 Long term (current) use of anticoagulants: Secondary | ICD-10-CM | POA: Insufficient documentation

## 2015-12-08 HISTORY — PX: TEE WITHOUT CARDIOVERSION: SHX5443

## 2015-12-08 LAB — BASIC METABOLIC PANEL
ANION GAP: 10 (ref 5–15)
BUN: 12 mg/dL (ref 6–20)
CHLORIDE: 106 mmol/L (ref 101–111)
CO2: 27 mmol/L (ref 22–32)
Calcium: 9.6 mg/dL (ref 8.9–10.3)
Creatinine, Ser: 0.78 mg/dL (ref 0.44–1.00)
GFR calc Af Amer: >60 mL/min (ref >60)
GLUCOSE: 100 mg/dL — AB (ref 65–99)
POTASSIUM: 4.8 mmol/L (ref 3.5–5.1)
Sodium: 143 mmol/L (ref 135–145)

## 2015-12-08 LAB — GLUCOSE, CAPILLARY: Glucose-Capillary: 110 mg/dL — ABNORMAL HIGH (ref 65–99)

## 2015-12-08 SURGERY — ECHOCARDIOGRAM, TRANSESOPHAGEAL
Anesthesia: Moderate Sedation

## 2015-12-08 MED ORDER — MIDAZOLAM HCL 5 MG/ML IJ SOLN
INTRAMUSCULAR | Status: AC
  Filled 2015-12-08: qty 2

## 2015-12-08 MED ORDER — SODIUM CHLORIDE 0.9 % IV SOLN
INTRAVENOUS | Status: DC
  Administered 2015-12-08: 500 mL via INTRAVENOUS

## 2015-12-08 MED ORDER — FENTANYL CITRATE (PF) 100 MCG/2ML IJ SOLN
INTRAMUSCULAR | Status: DC | PRN
  Administered 2015-12-08: 25 ug via INTRAVENOUS

## 2015-12-08 MED ORDER — MIDAZOLAM HCL 10 MG/2ML IJ SOLN
INTRAMUSCULAR | Status: DC | PRN
  Administered 2015-12-08: 2 mg via INTRAVENOUS
  Administered 2015-12-08: 1 mg via INTRAVENOUS

## 2015-12-08 MED ORDER — FENTANYL CITRATE (PF) 100 MCG/2ML IJ SOLN
INTRAMUSCULAR | Status: AC
  Filled 2015-12-08: qty 2

## 2015-12-08 NOTE — CV Procedure (Signed)
Procedure: TEE  Sedation: Versed 3 mg IV, Fentanyl 25 mcg IV  Indication: Aortic stenosis, uncertain severity.   Findings: Please see echo section for full report.  Normal LV size with mildly decreased systolic function, EF [id number] with diffuse hypokinesis.  Severe left atrial enlargement.  No LA appendage thrombus.  Normal LV size with mildly decreased systolic function.  Severe right atrial enlargement.  Trileaflet aortic valve with severe calcification.  Visually, moderate to severe stenosis.  Mean gradient 16 mmHg but calculated valve area 0.7 cm^2.  This represents low gradient severe AS versus "pseudostenosis."  The mitral valve was calcified with possible chordal rupture involving the anterior leaflet.  There was a very eccentric, posteriorly directed jet of mitral regurgitation that appeared to wrap all the way around the left atrium (Coanda effect).  Systolic flattening in the pulmonary vein systolic doppler pattern.  Possible severe mitral regurgitation.  Mild to moderate tricuspid regurgitation, peak RV-RA gradient 35 mmHg.  Normal caliber aorta with grade III plaque in the descending thoracic aorta.   Impression: Possible ruptured mitral chord with very eccentric, possibly severe mitral regurgitation.  Concern for low gradient severe aortic stenosis versus "pseudostenosis."    Destiny [name] 12/08/2015 3:00 PM

## 2015-12-08 NOTE — H&P (View-Only) (Signed)
Patient ID: Destiny [name], female   DOB: November 15, 1934, 81 y.o.   MRN: [id number] PCP: Dr Raoul Pitch Cardiology: Dr. Johnsie Cancel HF Cardiology: Dr. Aundra Dubin  81 yo with chronic atrial fibrillation and chronic systolic CHF presents for CHF clinic followup.  She has been in atrial fibrillation long-term.  However, she had been more short of breath with exertion since 9/15.  This gradually worsened.  Echo in 3/15 actually showed normal EF.  Given increased dyspnea, echo was done in 3/16.  This showed EF 30-35%, newly decreased.  At last appointment, she was short of breath walking 50-100 feet, making up her bed, carrying any load, and vacuuming.    I thought she was volume overloaded and increased Lasix/started Entresto. Weight is down 8 lbs.  She is doing much better.  Able to walk her dog twice a day without problems.  Mild dyspnea walking up steps.  No orthopnea/PND. No lightheadedness.    I did right and left heart cath.  This showed no significant CAD and normal right and left heart filling pressures.  There was mild pulmonary arterial HTN.  Echo was done also, showing EF 40-45% (improved) with moderate to severe aortic stenosis.   Labs (1/17): K 3.9, creatinine 0.63, BNP 198 Labs (2/17): K 4.4, creatinine 0.69  ECG: atrial fibrillation, right superior axis  PMH: 1. HTN 2. DM type II 3. Chronic atrial fibrillation 4. Right TKR 12/10.  5. Chronic systolic CHF:  - Remote cath showed no significant CAD> - Echo (3/15) with EF 60-65%.  - Echo (3/16) with EF 30-35%, mild LVH, mild aortic stenosis mean gradient 13 mmHg, mild MR, RV mildly dilated with normal systolic function, moderate TR.  - LHC/RHC (2/17): minimal luminal irregularities in coronaries.  Mean RA 4, PA 57/22, PCWP mean 15, PVR 2.7 WU, CI 3.29. Peak-to-peak aortic valve gradient 21 mmHg.  - Echo (2/17) with EF 40-45%, moderate LV hypertrophy, mild MR, normal RV size and systolic function, PASP 45 mmHg, moderate TR, moderate-severe aortic  stenosis with mean gradient 30 mmHg, peak 78 mmHg, AVA 0.8 cm^2.  6. Aortic stenosis: Moderate to severe by echo but poorly visualized.   FH: Atrial fibrillation.  No CAD.  SH: Retired, widow, lives in [place], quit smoking 1989, originally from [place], has 7 kids.   ROS: All systems reviewed and negative except as per HPI.   Current Outpatient Prescriptions  Medication Sig Dispense Refill  . atorvastatin (LIPITOR) 20 MG tablet Take 0.5 tablets (10 mg total) by mouth daily at 6 PM. 45 tablet 3  . Calcium Carbonate-Vit D-Min 600-400 MG-UNIT TABS Take 1 tablet by mouth daily.     . carvedilol (COREG) 25 MG tablet Take 1 tablet (25 mg total) by mouth 2 (two) times daily with a meal. 180 tablet 3  . Cinnamon 500 MG capsule Take 500 mg by mouth daily.      . dorzolamide-timolol (COSOPT) 22.3-6.8 MG/ML ophthalmic solution Place 1 drop into both eyes 2 (two) times daily.     . furosemide (LASIX) 40 MG tablet Take 1.5 tabs in AM and 1 tab in pM 225 tablet 3  . glipiZIDE (GLUCOTROL XL) 10 MG 24 hr tablet Take 1 tablet (10 mg total) by mouth daily. 90 tablet 1  . glucose blood (ACCU-CHEK AVIVA PLUS) test strip Use as instructed 100 each 11  . KLOR-CON M20 20 MEQ tablet Take 2 tablets (40 mEq total) by mouth daily. 180 tablet 3  . latanoprost (XALATAN) 0.005 % ophthalmic solution  Place 1 drop into both eyes at bedtime.     . magnesium oxide (MAG-OX) 400 MG tablet Take 400 mg by mouth daily.    . metFORMIN (GLUMETZA) 500 MG (MOD) 24 hr tablet Take 1,000 mg by mouth daily.     . Multiple Vitamins-Minerals (MULTIVITAL) tablet Take 1 tablet by mouth daily.      . Omega-3 Fatty Acids (FISH OIL) 1000 MG CAPS Take 1 capsule by mouth daily.     . sacubitril-valsartan (ENTRESTO) 24-26 MG Take 1 tablet by mouth 2 (two) times daily. 60 tablet 3  . vitamin E 400 UNIT capsule Take 400 Units by mouth daily.      Marland Kitchen warfarin (COUMADIN) 5 MG tablet Take 1 tablet (5 mg total) by mouth daily. Takes 2.5mg  on Monday,  and 5mg  all other days. (Patient taking differently: Take 5 mg by mouth daily. Takes 2.5mg  on Monday,wednesday and friday and 5mg  all other days.) 90 tablet 3  . spironolactone (ALDACTONE) 25 MG tablet Take 0.5 tablets (12.5 mg total) by mouth daily. 15 tablet 3   No current facility-administered medications for this encounter.   BP 124/72 mmHg  Pulse 89  Wt 155 lb (70.308 kg)  SpO2 96% General: NAD Neck: JVP 7 cm, no thyromegaly or thyroid nodule.  Lungs: Clear to auscultation bilaterally with normal respiratory effort. CV: Nondisplaced PMI.  Heart irregular S1/S2, no XX123456, 3/6 systolic crescendo-decrescendo murmur RUSB with S2 heard clearly.  No edema.  No carotid bruit.  Normal pedal pulses.  Abdomen: Soft, nontender, no hepatosplenomegaly, no distention.  Skin: Intact without lesions or rashes.  Neurologic: Alert and oriented x 3.  Psych: Normal affect. Extremities: No clubbing or cyanosis.  HEENT: Normal.   Assessment/Plan: 1. Chronic systolic CHF: Echo (0000000) with EF 30-35%, diffuse hypokinesis, improved to 40-45% on 2/17 echo.  Nonischemic cardiomyopathy, no significant coronary disease on angiography.  Well-compensated left and right-sided filling pressures on current Lasix.  - Continue Lasix 60 qam/40 qpm and KCl 40 daily.  - Continue Entresto and Coreg.  - Start spironolactone 12.5 mg daily with BMET in 10 days.   2. Atrial fibrillation: Chronic.  Rate is controlled. She has been in atrial fibrillation for years, unlikely to be able to successfully cardiovert her. Continue warfarin.  3. Aortic stenosis: Moderate to severe on echo from 2/17.  Severe by AVA, moderate by mean gradient.  By cath, suspect moderate given relative ease of crossing valve. However, if she does have severe AS, I would be concerned that hemodynamically significant AS could be behind her fall in EF.  I think we need more definitive evaluation of the aortic valve.   - I will arrange for TEE to look at aortic  valve.  I discussed risks/benefits of procedure with the patient, and she agrees to proceed.   4. Pulmonary hypertension: Mild to moderate pulmonary hypertension on RHC but PVR not significantly elevated.  I think a sleep study would be reasonable given pulmonary hypertension.   Loralie Champagne 11/30/2015

## 2015-12-08 NOTE — Interval H&P Note (Signed)
History and Physical Interval Note:  12/08/2015 2:29 PM  Destiny [name]  has presented today for surgery, with the diagnosis of aortic stenosis  The various methods of treatment have been discussed with the patient and family. After consideration of risks, benefits and other options for treatment, the patient has consented to  Procedure(s): TRANSESOPHAGEAL ECHOCARDIOGRAM (TEE) (N/A) as a surgical intervention .  The patient's history has been reviewed, patient examined, no change in status, stable for surgery.  I have reviewed the patient's chart and labs.  Questions were answered to the patient's satisfaction.     Gregrey Bloyd [employer]

## 2015-12-08 NOTE — Discharge Instructions (Signed)
Transesophageal Echocardiogram Transesophageal echocardiography (TEE) is a picture test of your heart using sound waves. The pictures taken can give very detailed pictures of your heart. This can help your doctor see if there are problems with your heart. TEE can check:  If your heart has blood clots in it.  How well your heart valves are working.  If you have an infection on the inside of your heart.  Some of the major arteries of your heart.  If your heart valve is working after a Office manager.  Your heart before a procedure that uses a shock to your heart to get the rhythm back to normal.   AFTER THE PROCEDURE  You will be taken to a recovery area so the sedative can wear off.  Your throat may be sore and scratchy. This will go away slowly over time.  You will go home when you are fully awake and able to swallow liquids.  You should have someone stay with you for the next 24 hours.  Do not drive or operate machinery for the next 24 hours.   This information is not intended to replace advice given to you by your health care provider. Make sure you discuss any questions you have with your health care provider.   Document Released: 07/22/2009 Document Revised: 09/29/2013 Document Reviewed: 03/26/2013 Elsevier Interactive Patient Education [employer].

## 2015-12-09 ENCOUNTER — Telehealth (HOSPITAL_COMMUNITY): Payer: Self-pay | Admitting: *Deleted

## 2015-12-09 ENCOUNTER — Telehealth: Payer: Self-pay | Admitting: Family Medicine

## 2015-12-09 DIAGNOSIS — I350 Nonrheumatic aortic (valve) stenosis: Secondary | ICD-10-CM

## 2015-12-09 NOTE — Telephone Encounter (Signed)
Received message from Dr Aundra Dubin, pt needs a low dose dobutamine stress echo to eval severe aortic stenosis, order placed

## 2015-12-09 NOTE — Telephone Encounter (Signed)
Pt states that she was just discharged from the hosp and her metformin was given to her at nights while there, pt asking if ok to switch back to days?

## 2015-12-09 NOTE — Telephone Encounter (Signed)
Spoke with patient. Ok to take metformin in the am. Explained to patient she just needs to take it at the sam e time everyday. Patient verbalized understanding.

## 2015-12-26 ENCOUNTER — Ambulatory Visit (HOSPITAL_COMMUNITY): Payer: Commercial Managed Care - HMO

## 2015-12-29 ENCOUNTER — Ambulatory Visit (HOSPITAL_COMMUNITY)
Admission: RE | Admit: 2015-12-29 | Discharge: 2015-12-29 | Disposition: A | Discharge status: Home / Self Care | Payer: Commercial Managed Care - HMO | Source: Ambulatory Visit | Attending: Cardiology | Admitting: Cardiology

## 2015-12-29 ENCOUNTER — Telehealth (HOSPITAL_COMMUNITY): Payer: Self-pay | Admitting: Vascular Surgery

## 2015-12-29 ENCOUNTER — Encounter (HOSPITAL_COMMUNITY): Payer: Self-pay

## 2015-12-29 VITALS — BP 134/72 | HR 81 | Wt 156.8 lb

## 2015-12-29 DIAGNOSIS — I5022 Chronic systolic (congestive) heart failure: Secondary | ICD-10-CM | POA: Diagnosis present

## 2015-12-29 DIAGNOSIS — I428 Other cardiomyopathies: Secondary | ICD-10-CM | POA: Diagnosis not present

## 2015-12-29 DIAGNOSIS — I350 Nonrheumatic aortic (valve) stenosis: Secondary | ICD-10-CM | POA: Diagnosis not present

## 2015-12-29 DIAGNOSIS — I482 Chronic atrial fibrillation: Secondary | ICD-10-CM | POA: Insufficient documentation

## 2015-12-29 DIAGNOSIS — I5042 Chronic combined systolic (congestive) and diastolic (congestive) heart failure: Secondary | ICD-10-CM

## 2015-12-29 DIAGNOSIS — I272 Other secondary pulmonary hypertension: Secondary | ICD-10-CM | POA: Diagnosis not present

## 2015-12-29 DIAGNOSIS — I080 Rheumatic disorders of both mitral and aortic valves: Secondary | ICD-10-CM | POA: Diagnosis not present

## 2015-12-29 DIAGNOSIS — Z79899 Other long term (current) drug therapy: Secondary | ICD-10-CM | POA: Diagnosis not present

## 2015-12-29 DIAGNOSIS — Z87891 Personal history of nicotine dependence: Secondary | ICD-10-CM | POA: Diagnosis not present

## 2015-12-29 DIAGNOSIS — E119 Type 2 diabetes mellitus without complications: Secondary | ICD-10-CM | POA: Insufficient documentation

## 2015-12-29 DIAGNOSIS — Z7901 Long term (current) use of anticoagulants: Secondary | ICD-10-CM | POA: Diagnosis not present

## 2015-12-29 DIAGNOSIS — Z96651 Presence of right artificial knee joint: Secondary | ICD-10-CM | POA: Insufficient documentation

## 2015-12-29 DIAGNOSIS — Z7984 Long term (current) use of oral hypoglycemic drugs: Secondary | ICD-10-CM | POA: Insufficient documentation

## 2015-12-29 DIAGNOSIS — I110 Hypertensive heart disease with heart failure: Secondary | ICD-10-CM | POA: Diagnosis not present

## 2015-12-29 NOTE — Telephone Encounter (Signed)
Open in error

## 2015-12-29 NOTE — Patient Instructions (Addendum)
You have been referred to Dr. Darylene Price   Follow up with Dr.McLean in 2 months.

## 2015-12-30 ENCOUNTER — Encounter: Payer: Self-pay | Admitting: Cardiovascular Disease

## 2016-01-01 NOTE — Progress Notes (Signed)
Patient ID: Destiny [name], female   DOB: 1935/07/24, 81 y.o.   MRN: [id number] PCP: Dr Raoul Pitch Cardiology: Dr. Johnsie Cancel HF Cardiology: Dr. Aundra Dubin  81 yo with chronic atrial fibrillation and chronic systolic CHF presents for CHF clinic followup.  She has been in atrial fibrillation long-term.  However, she had been more short of breath with exertion since 9/15.  This gradually worsened.  Echo in 3/15 actually showed normal EF.  Given increased dyspnea, echo was done in 3/16.  This showed EF 30-35%, newly decreased.  At an earlier appt, she was short of breath walking 50-100 feet, making up her bed, carrying any load, and vacuuming.    I thought she was volume overloaded and increased Lasix/started Entresto. Weight decreased and she has been doing much better.  Able to walk her dog twice a day without problems.  Mild dyspnea walking up steps.  No orthopnea/PND. No lightheadedness.    I did a right and left heart cath in 2/17.  This showed no significant CAD and normal right and left heart filling pressures.  There was mild pulmonary arterial HTN.  Echo was done also, showing EF 40-45% (improved) with moderate to severe aortic stenosis.   I did a TEE in 3/17.  This showed EF 45% with possible low gradient severe AS versus pseudostenosis and possible anterior mitral leaflet chordal rupture with concern for very eccentric, severe mitral regurgitation.    Labs (1/17): K 3.9, creatinine 0.63, BNP 198 Labs (2/17): K 4.4, creatinine 0.69 Labs (3/17): K 4.8, creatinine 0.78  PMH: 1. HTN 2. DM type II 3. Chronic atrial fibrillation 4. Right TKR 12/10.  5. Chronic systolic CHF:  - Remote cath showed no significant CAD> - Echo (3/15) with EF 60-65%.  - Echo (3/16) with EF 30-35%, mild LVH, mild aortic stenosis mean gradient 13 mmHg, mild MR, RV mildly dilated with normal systolic function, moderate TR.  - LHC/RHC (2/17): minimal luminal irregularities in coronaries.  Mean RA 4, PA 57/22, PCWP mean 15, PVR  2.7 WU, CI 3.29. Peak-to-peak aortic valve gradient 21 mmHg.  - Echo (2/17) with EF 40-45%, moderate LV hypertrophy, mild MR, normal RV size and systolic function, PASP 45 mmHg, moderate TR, moderate-severe aortic stenosis with mean gradient 30 mmHg, peak 78 mmHg, AVA 0.8 cm^2.  - TEE (3/17) with EF 45%, severely calcified aortic valve with mean gradient 16 mmHg but AVA 0.7 cm^2 (aortic gradient likely underestimated based on 2/17 TTE => ?low gradient severe AS versus pseudostenosis), possible anterior mitral leaflet chordal rupture with very eccentric, posteriorly-directed mitral regurgitation (Coanda effect).   6. Aortic stenosis: Low gradient severe AS versus moderate AS with pseudostenosis.  7. Mitral regurgitation: Appears severe by TEE with possible chordal rupture.    FH: Atrial fibrillation.  No CAD.  SH: Retired, widow, lives in [place], quit smoking 1989, originally from [place], has 7 kids.   ROS: All systems reviewed and negative except as per HPI.   Current Outpatient Prescriptions  Medication Sig Dispense Refill  . atorvastatin (LIPITOR) 20 MG tablet Take 0.5 tablets (10 mg total) by mouth daily at 6 PM. 45 tablet 3  . Calcium Carbonate-Vit D-Min 600-400 MG-UNIT TABS Take 1 tablet by mouth daily.     . carvedilol (COREG) 25 MG tablet Take 1 tablet (25 mg total) by mouth 2 (two) times daily with a meal. 180 tablet 3  . Cinnamon 500 MG capsule Take 500 mg by mouth daily.      . dorzolamide-timolol (  COSOPT) 22.3-6.8 MG/ML ophthalmic solution Place 1 drop into both eyes 2 (two) times daily.     . furosemide (LASIX) 40 MG tablet Take 1.5 tabs in AM and 1 tab in pM 225 tablet 3  . glipiZIDE (GLUCOTROL XL) 10 MG 24 hr tablet Take 1 tablet (10 mg total) by mouth daily. 90 tablet 1  . glucose blood (ACCU-CHEK AVIVA PLUS) test strip Use as instructed 100 each 11  . KLOR-CON M20 20 MEQ tablet Take 2 tablets (40 mEq total) by mouth daily. 180 tablet 3  . latanoprost (XALATAN) 0.005 %  ophthalmic solution Place 1 drop into both eyes at bedtime.     Marland Kitchen linagliptin (TRADJENTA) 5 MG TABS tablet Take 1 tablet (5 mg total) by mouth daily. 30 tablet 5  . magnesium oxide (MAG-OX) 400 MG tablet Take 400 mg by mouth daily.    . metFORMIN (GLUCOPHAGE-XR) 500 MG 24 hr tablet Take 2 tablets (1,000 mg total) by mouth daily with breakfast. 60 tablet 5  . Multiple Vitamins-Minerals (MULTIVITAL) tablet Take 1 tablet by mouth daily.      . Omega-3 Fatty Acids (FISH OIL) 1000 MG CAPS Take 1 capsule by mouth daily.     . sacubitril-valsartan (ENTRESTO) 24-26 MG Take 1 tablet by mouth 2 (two) times daily. 60 tablet 3  . spironolactone (ALDACTONE) 25 MG tablet Take 0.5 tablets (12.5 mg total) by mouth daily. 15 tablet 3  . vitamin E 400 UNIT capsule Take 400 Units by mouth daily.      Marland Kitchen warfarin (COUMADIN) 5 MG tablet Take 1 tablet (5 mg total) by mouth daily. Takes 2.5mg  on Monday, and 5mg  all other days. (Patient taking differently: Take 5 mg by mouth daily. Takes 2.5mg  on Monday,wednesday and friday and 5mg  all other days.) 90 tablet 3   No current facility-administered medications for this encounter.   BP 134/72 mmHg  Pulse 81  Wt 156 lb 12 oz (71.101 kg)  SpO2 98% General: NAD Neck: JVP 7 cm, no thyromegaly or thyroid nodule.  Lungs: Clear to auscultation bilaterally with normal respiratory effort. CV: Nondisplaced PMI.  Heart irregular S1/S2, no XX123456, 3/6 systolic crescendo-decrescendo murmur RUSB with S2 heard clearly, 3/6 HSM at apex.  No edema.  No carotid bruit.  Normal pedal pulses.  Abdomen: Soft, nontender, no hepatosplenomegaly, no distention.  Skin: Intact without lesions or rashes.  Neurologic: Alert and oriented x 3.  Psych: Normal affect. Extremities: No clubbing or cyanosis.  HEENT: Normal.   Assessment/Plan: 1. Chronic systolic CHF: Echo (0000000) with EF 30-35%, diffuse hypokinesis, improved to 45% on 3/17 TEE.  Nonischemic cardiomyopathy, no significant coronary disease  on angiography.  This could be a valvular cardiomyopathy with both aortic valve and mitral valve disease.  Well-compensated left and right-sided filling pressures on current Lasix.  - Continue Lasix 60 qam/40 qpm and KCl 40 daily.  - Continue Entresto and Coreg.  - Continue spironolactone.    2. Atrial fibrillation: Chronic.  Rate is controlled. She has been in atrial fibrillation for years, unlikely to be able to successfully cardiovert her. Continue warfarin.  3. Aortic stenosis: On 2/17 echo, severe aortic stenosis by AVA, moderate by mean gradient.  By cath, suspect moderate given relative ease of crossing valve.  TEE again showed possible low gradient severe AS versus moderate AS with pseudostenosis.  - Given the co-existing mitral valve disease, I think that she needs surgical evaluation.  Not sure that low gradient DSE would be helpful as if she  undergoes surgery for mitral valve, the aortic valve should also be replaced given at least moderate AS.  4. Pulmonary hypertension: Mild to moderate pulmonary hypertension on RHC but PVR not significantly elevated.  I think a sleep study would be reasonable given pulmonary hypertension.  5. Mitral regurgitation: Possible rupture of an anterior leaflet chord with very eccentric mitral regurgitation.  I think that the MR is severe.  I am going to have her evaluated for mitral valve repair.  If this is done, would replace aortic valve as at least moderate stenosis and would consider Maze given atrial fibrillation.  I am going to refer to [facility].   Loralie Champagne 01/01/2016

## 2016-01-02 ENCOUNTER — Encounter: Payer: Self-pay | Admitting: Family Medicine

## 2016-01-02 DIAGNOSIS — I272 Pulmonary hypertension, unspecified: Secondary | ICD-10-CM | POA: Insufficient documentation

## 2016-01-03 ENCOUNTER — Telehealth: Payer: Self-pay | Admitting: Cardiovascular Disease

## 2016-01-03 ENCOUNTER — Telehealth: Payer: Self-pay | Admitting: *Deleted

## 2016-01-03 NOTE — Telephone Encounter (Signed)
°  New Prob   Pt has some questions regarding her Coumadin. Requesting to speak to Dr. Kyla Balzarine nurse only. Please call.

## 2016-01-03 NOTE — Telephone Encounter (Signed)
Patient calling about not being about to get her INR drawn at her PCP which is in [facility] [place]. Patient states she has a hard time getting around [facility] and cannot drive to [place] frequently to come to our office's coumadin clinic. Patient stated she had one INR done in [facility] with her PCP, but they would not continue to check her INR at that office. Will found out where patient can go and call her back.

## 2016-01-03 NOTE — Telephone Encounter (Signed)
Spoke with patient she needs to be seen at a coumadin clinic . Patient advised to call cardiology to get information on coumadin clinics to manage her coumadin per Dr Raoul Pitch.

## 2016-01-04 NOTE — Telephone Encounter (Signed)
Informed patient that the best our office can offer is for her to come to our coumadin clinic in [place]. Patient does not understanding why she can no longer have INR drawn at her PCP. Informed patient to follow-up with her PCP to see if they have any recommendations for that area.

## 2016-01-04 NOTE — Telephone Encounter (Signed)
Due to staffing issues we do not manage coumadin at our location. Patient is interested in having home health draw her PT/INR but this would need to be initiated and managed by your office. If this is a possibility for this patient could you arrange this?

## 2016-01-04 NOTE — Telephone Encounter (Signed)
Patient has been advised INR cannot be drawn at [facility] due to staff shortage. Patient is interested in home health referral for INR blood draw at her house due to driving limitations.

## 2016-01-05 ENCOUNTER — Encounter: Payer: Commercial Managed Care - HMO | Admitting: Thoracic Surgery (Cardiothoracic Vascular Surgery)

## 2016-01-05 NOTE — Telephone Encounter (Signed)
Our clinic has spoken with the patient multiple times today. Advised her that we would be able to follow her Coumadin here but she would need to come in for INR checks. She states that she cannot make it in for INR checks. She lives half an hour away from [place], [place], and [place] Coumadin clinics. Informed her that home health referral comes from PCP not cardiologist, and that there needs to be a reason other than Coumadin check for the nurse to visit her at home.  Scheduled pt for new Coumadin visit with [place] next week, she called an hour later to cancel it, did not want to reschedule it, and hung up.

## 2016-01-11 ENCOUNTER — Encounter: Payer: Self-pay | Admitting: Thoracic Surgery (Cardiothoracic Vascular Surgery)

## 2016-01-11 ENCOUNTER — Encounter: Payer: Commercial Managed Care - HMO | Admitting: Thoracic Surgery (Cardiothoracic Vascular Surgery)

## 2016-01-12 ENCOUNTER — Other Ambulatory Visit (HOSPITAL_COMMUNITY): Payer: Commercial Managed Care - HMO

## 2016-01-12 ENCOUNTER — Encounter: Payer: Self-pay | Admitting: Thoracic Surgery (Cardiothoracic Vascular Surgery)

## 2016-01-12 ENCOUNTER — Institutional Professional Consult (permissible substitution) (INDEPENDENT_AMBULATORY_CARE_PROVIDER_SITE_OTHER): Payer: Commercial Managed Care - HMO | Admitting: Thoracic Surgery (Cardiothoracic Vascular Surgery)

## 2016-01-12 ENCOUNTER — Other Ambulatory Visit: Payer: Self-pay | Admitting: *Deleted

## 2016-01-12 VITALS — BP 139/79 | HR 94 | Resp 16 | Ht 61.0 in | Wt 155.0 lb

## 2016-01-12 DIAGNOSIS — I350 Nonrheumatic aortic (valve) stenosis: Secondary | ICD-10-CM

## 2016-01-12 DIAGNOSIS — I482 Chronic atrial fibrillation, unspecified: Secondary | ICD-10-CM

## 2016-01-12 DIAGNOSIS — I272 Other secondary pulmonary hypertension: Secondary | ICD-10-CM | POA: Diagnosis not present

## 2016-01-12 DIAGNOSIS — I080 Rheumatic disorders of both mitral and aortic valves: Secondary | ICD-10-CM

## 2016-01-12 DIAGNOSIS — I712 Thoracic aortic aneurysm, without rupture, unspecified: Secondary | ICD-10-CM

## 2016-01-12 DIAGNOSIS — I5042 Chronic combined systolic (congestive) and diastolic (congestive) heart failure: Secondary | ICD-10-CM

## 2016-01-12 DIAGNOSIS — I5022 Chronic systolic (congestive) heart failure: Secondary | ICD-10-CM | POA: Diagnosis not present

## 2016-01-12 DIAGNOSIS — I7409 Other arterial embolism and thrombosis of abdominal aorta: Secondary | ICD-10-CM

## 2016-01-12 NOTE — Progress Notes (Signed)
Destiny [name] [address]       [name], [id number]             [phone]     CARDIOTHORACIC SURGERY CONSULTATION REPORT  Referring Provider is Larey Dresser, MD PCP is Tammi Sou, MD  Chief Complaint  Patient presents with  . Aortic Stenosis    eval vfor surgery...CATH  11/16/15, ECHO 11/25/15, TEE 12/08/15    HPI:  Patient is a 81 year old female with history of chronic combined systolic and diastolic congestive heart failure, chronic persistent atrial fibrillation on warfarin anticoagulation, hypertension, and type 2 diabetes mellitus who has been referred for surgical consultation to discuss treatment options for management of moderate aortic stenosis and severe mitral regurgitation.  She has a long history of chronic persistent atrial fibrillation that failed DC cardioversion in the distant past. She was followed for many years by Dr. Johnsie Cancel on long term warfarin anticoagulation.  Transthoracic echocardiogram performed in 2012 revealed normal left ventricular systolic function with moderate left ventricular hypertrophy, mild aortic stenosis, and trivial mitral regurgitation, and moderate to severe left atrial enlargement.  Over the past 2 years the patient has developed worsening symptoms of exertional shortness of breath. Follow-up echocardiogram performed 12/23/2014 revealed significant drop in left ventricular systolic function with ejection fraction estimated at only 30-35%.  At that time there remained only mild aortic stenosis and mild mitral regurgitation.  In January of this year she presented with acute exacerbation of chronic combined systolic and diastolic congestive heart failure with pulmonary edema on chest x-ray. She was referred to the advanced heart failure clinic where she was evaluated by Dr. Aundra Dubin on 11/07/2015.  Her Lasix was increased and the patient was started on Entresto.  Left and right heart catheterization was performed 11/16/2015. She was found  to have no significant coronary artery disease with mild pulmonary hypertension.  Follow-up transthoracic echocardiogram revealed ejection fraction 40-45%.  There appeared to be severe aortic stenosis with peak velocity across the aortic valve measured 4.4 m/s corresponding to mean trans-valvar gradient 30 mmHg.  There was mild mitral regurgitation reported. Because the aortic valve was easily crossed at the time of diagnostic cardiac catheterization, transesophageal echocardiogram was performed to reassess the severity of aortic stenosis.  TEE revealed ejection fraction 45% with moderate to severe aortic stenosis. Mean transvalvular gradient was estimated at 16 mmHg but valve area calculated only 0.7 cm. There was severe mitral regurgitation with a very eccentric jet of regurgitation directed posteriorly around the left atrium. The patient was referred for surgical consultation.  The patient is widowed and lives alone [name].  She has 7 adult children but she is essentially estranged from them and doesn't see them often at all. She is accompanied by a close friend for her office consultation today. She has been retired for approximately 2 years, having previously worked at a [employer]rap[employer], [employer].  She remains functionally independent. She drives an automobile, lives in a private home, and cares for herself.  She has somewhat limited mobility with poor balance. She has experienced too bad falls in the past, most recently a proximally 2 years ago. She walks using a 4 pronged cane. She lives a somewhat sedentary lifestyle. She has had progressive symptoms of exertional shortness of breath over the last several years culminating with her acute presentation in January. She has been doing much better over the past 2 months on her current medical therapy. At present she states that she only  gets short of breath with more strenuous activity or when she tries to hurry when she is walking. She is comfortable  with low level activity. She cannot lay flat in bed. She has never had any chest pain or chest tightness. She has not had any dizzy spells or syncope.  Past Medical History  Diagnosis Date  . Mitral valve insufficiency and aortic valve insufficiency   . Unspecified essential hypertension   . Mononeuritis of unspecified site   . Edema   . Calculus of kidney   . Unspecified glaucoma   . Pure hypercholesterolemia   . Osteoarthrosis, unspecified whether generalized or localized, unspecified site   . Atrial fibrillation ([name]) 1999-diagnosed  . Diabetes mellitus without mention of complication   . Complication of anesthesia     slow to awaken  . Diastolic CHF, chronic ([name]) 12/30/2014  . Cataract     left eye (removed)    Past Surgical History  Procedure Laterality Date  . Catarct surgery Left   . Tonsillectomy  1943  . Appendectomy  1954  . Abdominal hysterectomy  2000    partial  . Transesophageal echocardiogram  02/2012  . Holmium laser application Right [id number]    Procedure: HOLMIUM LASER APPLICATION;  Surgeon: Alexis Frock, MD;  Location: [name];  Service: Urology;  Laterality: Right;  . Cystoscopy w/ retrogrades Right 11/26/2012    Procedure: CYSTOSCOPY WITH RETROGRADE PYELOGRAM;  Surgeon: Alexis Frock, MD;  Location: [name];  Service: Urology;  Laterality: Right;  . Joint replacement Right 2009    knee  . Kidney stone      ? Dr Tammi Klippel  . Cardiac catheterization N/A 11/16/2015    Procedure: Right/Left Heart Cath and Coronary Angiography;  Surgeon: Larey Dresser, MD;  Location: [name];  Service: Cardiovascular;  Laterality: N/A;  . Tee without cardioversion N/A 12/08/2015    Procedure: TRANSESOPHAGEAL ECHOCARDIOGRAM (TEE);  Surgeon: Larey Dresser, MD;  Location: [name];  Service: Cardiovascular;  Laterality: N/A;    Family History  Problem Relation Age of Onset  . Cancer Mother 56    leukemia   . Breast cancer  Neg Hx   . Colon cancer Neg Hx     Social History   Social History  . Marital Status: Widowed    Spouse Name: N/A  . Number of Children: N/A  . Years of Education: N/A   Occupational History  . Not on file.   Social History Main Topics  . Smoking status: Former Smoker    Quit date: 07/11/1988  . Smokeless tobacco: Never Used  . Alcohol Use: Yes     Comment: occasional  . Drug Use: No  . Sexual Activity: No   Other Topics Concern  . Not on file   Social History Narrative   Widow. Lives alone. Attended business college.   Lives in one story home. Has living will.    Walker and/or four pronged cane for assistance.    Former smoker of 29 years. Quit smoking 1989.   Patient drinks caffeinated beverages, takes a daily vitamin.   Wears her seatbelt, wears dentures, smoke detector located in the home.       Current Outpatient Prescriptions  Medication Sig Dispense Refill  . atorvastatin (LIPITOR) 20 MG tablet Take 0.5 tablets (10 mg total) by mouth daily at 6 PM. 45 tablet 3  . Calcium Carbonate-Vit D-Min 600-400 MG-UNIT TABS Take 1 tablet by mouth daily.     Marland Kitchen  carvedilol (COREG) 25 MG tablet Take 1 tablet (25 mg total) by mouth 2 (two) times daily with a meal. 180 tablet 3  . Cinnamon 500 MG capsule Take 500 mg by mouth daily.      . dorzolamide-timolol (COSOPT) 22.3-6.8 MG/ML ophthalmic solution Place 1 drop into both eyes 2 (two) times daily.     . furosemide (LASIX) 40 MG tablet Take 1.5 tabs in AM and 1 tab in pM 225 tablet 3  . glipiZIDE (GLUCOTROL XL) 10 MG 24 hr tablet Take 1 tablet (10 mg total) by mouth daily. 90 tablet 1  . glucose blood (ACCU-CHEK AVIVA PLUS) test strip Use as instructed 100 each 11  . KLOR-CON M20 20 MEQ tablet Take 2 tablets (40 mEq total) by mouth daily. 180 tablet 3  . latanoprost (XALATAN) 0.005 % ophthalmic solution Place 1 drop into both eyes at bedtime.     Marland Kitchen linagliptin (TRADJENTA) 5 MG TABS tablet Take 1 tablet (5 mg total) by mouth  daily. 30 tablet 5  . magnesium oxide (MAG-OX) 400 MG tablet Take 400 mg by mouth daily.    . metFORMIN (GLUCOPHAGE-XR) 500 MG 24 hr tablet Take 2 tablets (1,000 mg total) by mouth daily with breakfast. (Patient taking differently: Take 1,000 mg by mouth at bedtime. ) 60 tablet 5  . Multiple Vitamins-Minerals (MULTIVITAL) tablet Take 1 tablet by mouth daily.      . Omega-3 Fatty Acids (FISH OIL) 1000 MG CAPS Take 1 capsule by mouth daily.     . sacubitril-valsartan (ENTRESTO) 24-26 MG Take 1 tablet by mouth 2 (two) times daily. 60 tablet 3  . spironolactone (ALDACTONE) 25 MG tablet Take 0.5 tablets (12.5 mg total) by mouth daily. 15 tablet 3  . vitamin E 400 UNIT capsule Take 400 Units by mouth daily.      Marland Kitchen warfarin (COUMADIN) 5 MG tablet Take 1 tablet (5 mg total) by mouth daily. Takes 2.5mg  on Monday, and 5mg  all other days. (Patient taking differently: Take 5 mg by mouth daily. Takes 2.5mg  on Monday,wednesday and friday and 5mg  all other days.) 90 tablet 3   No current facility-administered medications for this visit.    No Known Allergies    Review of Systems:   General:  normal appetite, decreased energy, no weight gain, + weight loss, no fever  Cardiac:  no chest pain with exertion, no chest pain at rest, +SOB with exertion, + resting SOB, no PND, + orthopnea, no palpitations, + arrhythmia, + atrial fibrillation, + LE edema, no dizzy spells, no syncope  Respiratory:  + shortness of breath, no home oxygen, no productive cough, no dry cough, no bronchitis, no wheezing, no hemoptysis, no asthma, no pain with inspiration or cough, no sleep apnea, no CPAP at night  GI:   no difficulty swallowing, no reflux, no frequent heartburn, no hiatal hernia, no abdominal pain, no constipation, no diarrhea, no hematochezia, no hematemesis, no melena  GU:   no dysuria,  no frequency, no urinary tract infection, no hematuria, no kidney stones, no kidney disease  Vascular:  no pain suggestive of  claudication, no pain in feet, no leg cramps, no varicose veins, no DVT, no non-healing foot ulcer  Neuro:   no stroke, no TIA's, no seizures, no headaches, no temporary blindness one eye,  no slurred speech, no peripheral neuropathy, no chronic pain, mild instability of gait, no memory/cognitive dysfunction  Musculoskeletal: + arthritis, no joint swelling, no myalgias, mild difficulty walking, somewhat decreased mobility  Skin:   no rash, no itching, no skin infections, no pressure sores or ulcerations  Psych:   no anxiety, no depression, no nervousness, no unusual recent stress  Eyes:   no blurry vision, no floaters, no recent vision changes, + wears glasses or contacts  ENT:   no hearing loss, edentulous, full set dentures, last saw dentist many years ago  Hematologic:  no easy bruising, no abnormal bleeding, no clotting disorder, no frequent epistaxis  Endocrine:  + diabetes, does check CBG's at home      Physical Exam:   BP 139/79 mmHg  Pulse 94  Resp 16  Ht 5\' 1"  (1.549 m)  Wt 155 lb (70.308 kg)  BMI 29.30 kg/m2  SpO2 96%  General:  Mildly obese,  well-appearing  HEENT:  Unremarkable   Neck:   no JVD, no bruits, no adenopathy   Chest:   clear to auscultation, symmetrical breath sounds, no wheezes, no rhonchi   CV:   Irregular rate and rhythm, harsh crescendo/decrescendo systolic murmur at LSB with IV/VI holosystolic murmur at apex   Abdomen:  soft, non-tender, no masses   Extremities:  warm, well-perfused, pulses not palpable, mild LE edema, + varicose veins  Rectal/GU  Deferred  Neuro:   Grossly non-focal and symmetrical throughout  Skin:   Clean and dry, no rashes, no breakdown   Diagnostic Tests:  Transthoracic Echocardiography  Patient:  Destiny [name], Destiny [name] MR #:    [id number] Study Date: 01/06/2015 Gender:   F Age:    88 Height:   157.5 cm Weight:   71.7 kg BSA:    1.79 m^2 Pt. Status: Room:  ATTENDING  Jenkins Rouge, M.D. ORDERING    Jenkins Rouge, M.D. REFERRING  Jenkins Rouge, M.D. SONOGRAPHER Cindy Hazy, RDCS PERFORMING  [facility], Outpatient  cc:  ------------------------------------------------------------------- LV EF: 30% -  35%  ------------------------------------------------------------------- Indications:   I 35.0 Aortic Stenosis.  ------------------------------------------------------------------- History:  PMH: Acquired from the patient and from the patient&'s chart. PMH: Atrial Fibrillation. Carotid Bruit. Edema. Murmur. Mitral Regurgitation. Risk factors: Former tobacco use. Hypertension. Diabetes mellitus. Hypercholesterolemia.  ------------------------------------------------------------------- Study Conclusions  - Left ventricle: The cavity size was normal. There was mild concentric hypertrophy. Systolic function was moderately to severely reduced. The estimated ejection fraction was in the range of 30% to 35%. Wall motion was normal; there were no regional wall motion abnormalities. Doppler parameters are consistent with a reversible restrictive pattern, indicative of decreased left ventricular diastolic compliance and/or increased left atrial pressure (grade 3 diastolic dysfunction). - Aortic valve: Fixed non coronary cusp. There was mild stenosis. It is possible that gradient is more severe than measured secondary to reduced stroke volume in the setting of reduced EF. Mean gradient (S): 13 mm Hg. - Mitral valve: There was mild regurgitation directed posteriorly. - Left atrium: The atrium was severely dilated. - Right ventricle: The cavity size was mildly dilated. Wall thickness was normal. - Right atrium: The atrium was severely dilated. - Tricuspid valve: There was moderate regurgitation.  Transthoracic echocardiography. M-mode, complete 2D, spectral Doppler, and color Doppler. Birthdate: Patient birthdate: [birth year]. Age: Patient is 81  yr old. Sex: Gender: female. BMI: 28.9 kg/m^2. Patient status: Outpatient. Study date: Study date: 01/06/2015. Study time: 04:01 PM. Location: [facility]  -------------------------------------------------------------------  ------------------------------------------------------------------- Left ventricle: The cavity size was normal. There was mild concentric hypertrophy. Systolic function was moderately to severely reduced. The estimated ejection fraction was in the range of 30% to 35%. Wall motion was normal; there were  no regional wall motion abnormalities. Doppler parameters are consistent with a reversible restrictive pattern, indicative of decreased left ventricular diastolic compliance and/or increased left atrial pressure (grade 3 diastolic dysfunction).  ------------------------------------------------------------------- Aortic valve:  Trileaflet; normal thickness leaflets. Fixed non coronary cusp. Doppler: There was mild stenosis. It is possible that gradient is more severe than measured secondary to reduced stroke volume in the setting of reduced EF. There was no regurgitation.  VTI ratio of LVOT to aortic valve: 0.24. Valve area (VTI): 0.93 cm^2. Indexed valve area (VTI): 0.52 cm^2/m^2. Mean velocity ratio of LVOT to aortic valve: 0.24. Valve area (Vmean): 0.91 cm^2. Indexed valve area (Vmean): 0.51 cm^2/m^2. Mean gradient (S): 13 mm Hg.  ------------------------------------------------------------------- Aorta: Aortic root: The aortic root was normal in size.  ------------------------------------------------------------------- Mitral valve:  Structurally normal valve.  Mobility was not restricted. Doppler: Transvalvular velocity was within the normal range. There was no evidence for stenosis. There was mild regurgitation directed posteriorly.  Peak gradient (D): 10 mm  Hg.  ------------------------------------------------------------------- Left atrium: The atrium was severely dilated.  ------------------------------------------------------------------- Right ventricle: The cavity size was mildly dilated. Wall thickness was normal. Systolic function was normal.  ------------------------------------------------------------------- Pulmonic valve:  Poorly visualized. Structurally normal valve. Cusp separation was normal. Doppler: Transvalvular velocity was within the normal range. There was no evidence for stenosis. There was mild regurgitation.  ------------------------------------------------------------------- Tricuspid valve:  Structurally normal valve.  Doppler: Transvalvular velocity was within the normal range. There was moderate regurgitation.  ------------------------------------------------------------------- Pulmonary artery:  The main pulmonary artery was normal-sized. Systolic pressure was within the normal range.  ------------------------------------------------------------------- Right atrium: The atrium was severely dilated.  ------------------------------------------------------------------- Pericardium: There was no pericardial effusion.  ------------------------------------------------------------------- Systemic veins: Inferior vena cava: The vessel was dilated. The respirophasic diameter changes were blunted (< 50%), consistent with elevated central venous pressure.  ------------------------------------------------------------------- Measurements  Left ventricle              Value     Reference LV ID, ED, PLAX chordal          45  mm    43 - 52 LV ID, ES, PLAX chordal          35  mm    23 - 38 LV fx shortening, PLAX chordal  (L)   22  %    >=29 LV PW thickness, ED            14  mm    --------- IVS/LV PW ratio, ED             1       <=1.3 Stroke volume, 2D             46  ml    --------- Stroke volume/bsa, 2D           26  ml/m^2  ---------  Ventricular septum            Value     Reference IVS thickness, ED             14  mm    ---------  LVOT                   Value     Reference LVOT ID, S                22  mm    --------- LVOT area                 3.8  cm^2   ---------  LVOT mean velocity, S           40  cm/s   --------- LVOT VTI, S                12.2 cm    ---------  Aortic valve               Value     Reference Aortic valve mean velocity, S       167  cm/s   --------- Aortic valve VTI, S            50.1 cm    --------- Aortic mean gradient, S          13  mm Hg  --------- VTI ratio, LVOT/AV            0.24      --------- Aortic valve area, VTI          0.93 cm^2   --------- Aortic valve area/bsa, VTI        0.52 cm^2/m^2 --------- Velocity ratio, mean, LVOT/AV       0.24      --------- Aortic valve area, mean velocity     0.91 cm^2   --------- Aortic valve area/bsa, mean        0.51 cm^2/m^2 --------- velocity  Aorta                   Value     Reference Aortic root ID, ED            32  mm    ---------  Left atrium                Value     Reference LA ID, A-P, ES              59  mm    --------- LA ID/bsa, A-P          (H)   3.29 cm/m^2  <=2.2 LA volume, S               176  ml    --------- LA volume/bsa, S             98.1 ml/m^2  --------- LA volume, ES, 1-p A4C          189  ml    --------- LA volume/bsa, ES, 1-p A4C         105.4 ml/m^2  --------- LA volume, ES, 1-p A2C          163  ml    --------- LA volume/bsa, ES, 1-p A2C        90.9 ml/m^2  ---------  Mitral valve               Value     Reference Mitral E-wave peak velocity        159  cm/s   --------- Mitral deceleration time         194  ms    150 - 230 Mitral peak gradient, D          10  mm Hg  ---------  Tricuspid valve              Value     Reference Tricuspid regurg peak velocity      321  cm/s   --------- Tricuspid peak RV-RA gradient       41  mm Hg  ---------  Right ventricle  Value     Reference RV s&', lateral, S             7.18 cm/s   ---------  Legend: (L) and (H) mark values outside specified reference range.  ------------------------------------------------------------------- Prepared and Electronically Authenticated by  Candee Furbish, M.D. 2016-03-31T17:52:31    Transthoracic Echocardiography  Patient: Destiny [name], Destiny [name] MR #: [id number] Study Date: 11/25/2015 Gender: F Age: 88 Height: 154.9 cm Weight: 68.9 kg BSA: 1.75 m^2 Pt. Status: Room:  ATTENDING Murray Hodgkins, MD ORDERING Murray Hodgkins, MD [place], MD SONOGRAPHER [facility], RVT, RDCS PERFORMING [facility] Outpatient  cc:  ------------------------------------------------------------------- LV EF: 40% - 45%  ------------------------------------------------------------------- History: PMH: Acquired from the patient and from the patient&'s chart. Dyspnea. Atrial fibrillation. Risk factors: Hypertension. Diabetes mellitus. Dyslipidemia.  ------------------------------------------------------------------- Study Conclusions  - Left ventricle: The cavity size was normal. There  was moderate  concentric hypertrophy. Systolic function was mildly to  moderately reduced. The estimated ejection fraction was in the  range of 40% to 45%. Wall motion was normal; there were no  regional wall motion abnormalities. Doppler parameters are  consistent with restrictive physiology, indicative of decreased  left ventricular diastolic compliance and/or increased left  atrial pressure. Doppler parameters are consistent with elevated  ventricular end-diastolic filling pressure. - Aortic valve: Valve mobility was restricted. Peak gradient (S):  78 mm Hg. Valve area (VTI): 0.81 cm^2. Valve area (Vmax): 0.89  cm^2. Valve area (Vmean): 1.04 cm^2. - Mitral valve: Calcified annulus. Moderately thickened, mildly  calcified leaflets . There was mild regurgitation. - Left atrium: The atrium was severely dilated. - Right atrium: The atrium was severely dilated. - Tricuspid valve: There was moderate regurgitation. - Pulmonary arteries: Systolic pressure was moderately increased.  PA peak pressure: 45 mm Hg (S). - Inferior vena cava: The vessel was normal in size. - Pericardium, extracardiac: There was no pericardial effusion.  Impressions:  - When compared to the prior study from 01/06/2015 LVEF is now  improved. Aortic stenosis is severe.  There is moderate pulmonary hypertension.  ------------------------------------------------------------------- Labs, prior tests, procedures, and surgery: Catheterization (11/16/2015). Right and left: no significant CAD, near normal right and left heart filling pressures, mild pulmonary HTN, improved EF.  Transthoracic echocardiography. M-mode, complete 2D, spectral Doppler, and color Doppler. Birthdate: Patient birthdate: 1934-11-08. Age: Patient is 81 yr old. Sex: Gender: female. BMI: 28.7 kg/m^2. Blood pressure: 140/80 Patient status: Outpatient. Study date: Study date: 11/25/2015. Study time:  03:33 PM.  -------------------------------------------------------------------  ------------------------------------------------------------------- Left ventricle: The cavity size was normal. There was moderate concentric hypertrophy. Systolic function was mildly to moderately reduced. The estimated ejection fraction was in the range of 40% to 45%. Wall motion was normal; there were no regional wall motion abnormalities. Doppler parameters are consistent with restrictive physiology, indicative of decreased left ventricular diastolic compliance and/or increased left atrial pressure. Doppler parameters are consistent with elevated ventricular end-diastolic filling pressure.  ------------------------------------------------------------------- Aortic valve: Trileaflet; moderately thickened, moderately calcified leaflets. Valve mobility was restricted. Doppler: Transvalvular velocity was within the normal range. There was no stenosis. There was no regurgitation. VTI ratio of LVOT to aortic valve: 0.21. Valve area (VTI): 0.81 cm^2. Indexed valve area (VTI): 0.46 cm^2/m^2. Peak velocity ratio of LVOT to aortic valve: 0.23. Valve area (Vmax): 0.89 cm^2. Indexed valve area (Vmax): 0.51 cm^2/m^2. Mean velocity ratio of LVOT to aortic valve: 0.27. Valve area (Vmean): 1.04 cm^2. Indexed valve area (Vmean): 0.59 cm^2/m^2.  Mean gradient (S): 30 mm Hg. Peak gradient (S): 78 mm Hg.  -------------------------------------------------------------------  Aorta: Aortic root: The aortic root was normal in size. Ascending aorta: The ascending aorta was normal in size.  ------------------------------------------------------------------- Mitral valve: Calcified annulus. Moderately thickened, mildly calcified leaflets . Mobility was not restricted. Doppler: Transvalvular velocity was within the normal range. There was no evidence for stenosis. There was mild regurgitation.  Peak gradient (D): 7 mm Hg.  ------------------------------------------------------------------- Left atrium: The atrium was severely dilated.  ------------------------------------------------------------------- Right ventricle: The cavity size was normal. Wall thickness was normal. Systolic function was normal.  ------------------------------------------------------------------- Pulmonic valve: Structurally normal valve. Cusp separation was normal. Doppler: Transvalvular velocity was within the normal range. There was no evidence for stenosis. There was trivial regurgitation.  ------------------------------------------------------------------- Tricuspid valve: Structurally normal valve. Doppler: Transvalvular velocity was within the normal range. There was moderate regurgitation.  ------------------------------------------------------------------- Pulmonary artery: The main pulmonary artery was normal-sized. Systolic pressure was moderately increased.  ------------------------------------------------------------------- Right atrium: The atrium was severely dilated.  ------------------------------------------------------------------- Pericardium: There was no pericardial effusion.  ------------------------------------------------------------------- Systemic veins: Inferior vena cava: The vessel was normal in size.  ------------------------------------------------------------------- Measurements  Left ventricle Value Reference LV ID, ED, PLAX chordal (L) 42.7 mm 43 - 52 LV ID, ES, PLAX chordal 33.5 mm 23 - 38 LV fx shortening, PLAX chordal (L) 22 % >=29 LV PW thickness, ED 15.3 mm --------- IVS/LV PW ratio, ED 1.03 <=1.3 Stroke volume, 2D 68 ml  --------- Stroke volume/bsa, 2D 39 ml/m^2 --------- LV ejection fraction, 1-p A4C 49 % --------- LV end-diastolic volume, 2-p 54 ml --------- LV end-systolic volume, 2-p 28 ml --------- LV ejection fraction, 2-p 48 % --------- Stroke volume, 2-p 26 ml --------- LV end-diastolic volume/bsa, 2-p 31 ml/m^2 --------- LV end-systolic volume/bsa, 2-p 16 ml/m^2 --------- Stroke volume/bsa, 2-p 14.9 ml/m^2 --------- LV e&', lateral 4.97 cm/s --------- LV E/e&', lateral 27.36 --------- LV e&', medial 4 cm/s --------- LV E/e&', medial 34 --------- LV e&', average 4.49 cm/s --------- LV E/e&', average 30.32 ---------  Ventricular septum Value Reference IVS thickness, ED 15.8 mm ---------  LVOT Value Reference LVOT ID, S 22 mm --------- LVOT area 3.8 cm^2 --------- LVOT peak velocity, S 103 cm/s --------- LVOT mean velocity, S 67.2 cm/s --------- LVOT VTI, S 18 cm ---------  Aortic valve Value Reference Aortic valve peak velocity, S 441 cm/s --------- Aortic valve mean velocity, S 246 cm/s --------- Aortic valve VTI, S 84.1 cm --------- Aortic mean gradient, S 30 mm Hg  --------- Aortic peak gradient, S 78 mm Hg --------- VTI ratio, LVOT/AV 0.21 --------- Aortic valve area, VTI 0.81 cm^2 --------- Aortic valve area/bsa, VTI 0.46 cm^2/m^2 --------- Velocity ratio, peak, LVOT/AV 0.23 --------- Aortic valve area, peak velocity 0.89 cm^2 --------- Aortic valve area/bsa, peak 0.51 cm^2/m^2 --------- velocity Velocity ratio, mean, LVOT/AV 0.27 --------- Aortic valve area, mean velocity 1.04 cm^2 --------- Aortic valve area/bsa, mean 0.59 cm^2/m^2 --------- velocity  Aorta Value Reference Aortic root ID, ED 32 mm --------- Ascending aorta ID, A-P, S 34 mm ---------  Left atrium Value Reference LA ID, A-P, ES 54 mm --------- LA ID/bsa, A-P (H) 3.09 cm/m^2 <=2.2 LA volume, ES, 1-p A4C 141 ml --------- LA volume/bsa, ES, 1-p A4C 80.8 ml/m^2 ---------  Mitral valve Value Reference Mitral E-wave peak velocity 136 cm/s --------- Mitral A-wave peak velocity 21.9 cm/s --------- Mitral deceleration time 204 ms 150 - 230 Mitral peak gradient, D 7 mm Hg --------- Mitral E/A ratio, peak 6.2 ---------  Pulmonary arteries Value Reference PA pressure, S, DP (H) 45 mm Hg <=30  Tricuspid valve Value Reference Tricuspid  regurg peak velocity  323 cm/s --------- Tricuspid peak RV-RA gradient 42 mm Hg ---------  Pulmonic valve Value Reference Pulmonic regurg velocity, ED 131 cm/s --------- Pulmonic regurg gradient, ED 7 mm Hg ---------  Legend: (L) and (H) mark values outside specified reference range.  ------------------------------------------------------------------- Prepared and Electronically Authenticated by  Ena Dawley, M.D. 2017-02-17T16:52:31   CARDIAC CATHETERIZATION Procedures    Right/Left Heart Cath and Coronary Angiography    Conclusion    1. No significant CAD. 2. Near normal left and right heart filling pressures. Mild pulmonary hypertension with relatively low PVR, suggesting primarily pulmonary venous hypertension.  3. Preserved cardiac output.  4. EF appears improved, will need to confirm with echo.     Technique and Indications    Procedure: Right Heart Cath, Left Heart Cath, Selective Coronary Angiography, LV angiography  Indication: CHF, cardiomyopathy of uncertain etiology.   Procedural Details: The right groin and right radial/brachial areas were prepped, draped, and anesthetized with 1% lidocaine. There was a pre-existing peripheral IV in the right brachial area that was replaced with a 92F venous sheath. A Swan-Ganz catheter was used for the right heart catheterization. Standard protocol was followed for recording of right heart pressures and sampling of oxygen saturations. Fick cardiac output was calculated. Access was then gained in the right radial artery. I was able to pass the wire but unable to pass the sheath into the radial artery. Therefore, I elected to move to the right femoral artery. Using the modified Seldinger technique a 5 French sheath was placed in the right femoral artery. Standard Judkins catheters were used for selective coronary angiography and left  ventriculography. There were no immediate procedural complications. The patient was transferred to the post catheterization recovery area for further monitoring. Estimated blood loss <50 mL.    Coronary Findings    Dominance: Right   Left Main  No significant coronary disease.     Left Anterior Descending  No significant coronary disease.     Ramus Intermedius  Moderate ramus versus high diagonal with no significant coronary disease.     Left Circumflex  No significant coronary disease.     Right Coronary Artery  20% proximal, 20% mid RCA stenosis.       Right Heart Pressures [facility] Procedural Findings: Hemodynamics (mmHg) RA mean 4 RV 58/4 PA 57/22 mean 30 PCWP mean 15 LV 134/8 AO 113/55  Oxygen saturations: PA 70% AO 97%  Cardiac Output (Fick) 5.6  Cardiac Index (Fick) 3.29 PVR 2.7 WU    Wall Motion    Hand injection done because of frequent ectopy with catheter in ventricle. EF appears normal range, 55-60%.           Implants     No implant documentation for this case.    PACS Images    Show images for Cardiac catheterization     Link to Procedure Log    Procedure Log      Hemo Data       Most Recent Value   RA A Wave  5 mmHg   RA V Wave  5 mmHg   RA Mean  4 mmHg   RV Systolic Pressure  58 mmHg   RV Diastolic Pressure  -4 mmHg   RV EDP  4 mmHg   PA Systolic Pressure  57 mmHg   PA Diastolic Pressure  22 mmHg   PA Mean  30 mmHg   PW A Wave  16 mmHg   PW V Wave  15  mmHg   PW Mean  15 mmHg   AO Systolic Pressure  [id number] mmHg   AO Diastolic Pressure  54 mmHg   AO Mean  79 mmHg   LV Systolic Pressure  [id number] mmHg   LV Diastolic Pressure  -5 mmHg   LV EDP  8 mmHg   Arterial Occlusion Pressure Extended Systolic Pressure  123456 mmHg   Arterial Occlusion Pressure Extended Diastolic Pressure  55 mmHg   Arterial Occlusion Pressure Extended Mean Pressure  79 mmHg   Left Ventricular Apex Extended Systolic Pressure   0000000 mmHg   Left Ventricular Apex Extended Diastolic Pressure  14 mmHg   Left Ventricular Apex Extended EDP Pressure  43 mmHg       Transesophageal Echocardiography  Patient: Destiny [name], Destiny [name] MR #: [id number] Study Date: 12/08/2015 Gender: F Age: 30 Height: 154.9 cm Weight: 71.2 kg BSA: 1.78 m^2 Pt. Status: Room:  SONOGRAPHER Florentina Jenny, RDCS ADMITTING Loralie Champagne, M.D. ATTENDING Loralie Champagne, M.D. ORDERING Loralie Champagne, M.D. PERFORMING Loralie Champagne, M.D.  cc:  ------------------------------------------------------------------- LV EF: 45%  ------------------------------------------------------------------- Indications: Aortic stenosis 424.1.  ------------------------------------------------------------------- Study Conclusions  - Left ventricle: The cavity size was normal. Wall thickness was  normal. The estimated ejection fraction was 45%. Diffuse  hypokinesis. - Aortic valve: Trileaflet aortic valve with severe calcification.  Visually, moderate to severe stenosis. Mean gradient 16 mmHg but  calculated valve area 0.7 cm^2. This represents low gradient  severe AS versus &quot;pseudostenosis.&quot; - Aorta: Normal caliber aorta with grade III plaque in the  descending thoracic aorta. - Mitral valve: The mitral valve was calcified with possible  chordal rupture involving the anterior leaflet. There was a very  eccentric, posteriorly directed jet of mitral regurgitation that  appeared to wrap all the way around the left atrium (Coanda  effect). Systolic flattening in the pulmonary vein systolic  doppler pattern. Possible severe mitral regurgitation. - Left atrium: The atrium was severely dilated. No evidence of  thrombus in the atrial cavity or appendage. - Right ventricle: The cavity size was normal. Systolic function  was mildly reduced. - Right atrium: The atrium was  severely dilated. - Tricuspid valve: Mild to moderate tricuspid regurgitation, peak  RV-RA gradient 35 mmHg.  Impressions:  - Possible ruptured mitral chord with very eccentric, possibly  severe mitral regurgitation. Concern for low gradient severe  aortic stenosis versus &quot;pseudostenosis.&quot;  Diagnostic transesophageal echocardiography. 2D and color Doppler. Birthdate: Patient birthdate: 03-17-1935. Age: Patient is 81 yr old. Sex: Gender: female. BMI: 29.7 kg/m^2. Blood pressure:  140/75 Patient status: Outpatient. Study date: Study date: 12/08/2015. Study time: 02:18 PM. Location: Endoscopy.  -------------------------------------------------------------------  ------------------------------------------------------------------- Left ventricle: The cavity size was normal. Wall thickness was normal. The estimated ejection fraction was 45%. Diffuse hypokinesis.  ------------------------------------------------------------------- Aortic valve: Trileaflet aortic valve with severe calcification. Visually, moderate to severe stenosis. Mean gradient 16 mmHg but calculated valve area 0.7 cm^2. This represents low gradient severe AS versus &quot;pseudostenosis.&quot; Doppler: VTI ratio of LVOT to aortic valve: 0.18. Valve area (VTI): 0.61 cm^2. Indexed valve area (VTI): 0.34 cm^2/m^2. Peak velocity ratio of LVOT to aortic valve: 0.22. Valve area (Vmax): 0.77 cm^2. Indexed valve area (Vmax): 0.43 cm^2/m^2. Mean velocity ratio of LVOT to aortic valve: 0.21. Valve area (Vmean): 0.72 cm^2. Indexed valve area (Vmean): 0.41 cm^2/m^2.  Mean gradient (S): 16 mm Hg. Peak gradient (S): 24 mm Hg.  ------------------------------------------------------------------- Aorta: Normal caliber aorta with grade III plaque in the descending thoracic aorta.  ------------------------------------------------------------------- Mitral valve: The mitral valve  was  calcified with possible chordal rupture involving the anterior leaflet. There was a very eccentric, posteriorly directed jet of mitral regurgitation that appeared to wrap all the way around the left atrium (Coanda effect). Systolic flattening in the pulmonary vein systolic doppler pattern. Possible severe mitral regurgitation. Doppler: There was no evidence for stenosis.  ------------------------------------------------------------------- Left atrium: The atrium was severely dilated. No evidence of thrombus in the atrial cavity or appendage.  ------------------------------------------------------------------- Right ventricle: The cavity size was normal. Systolic function was mildly reduced.  ------------------------------------------------------------------- Pulmonic valve: Structurally normal valve. Cusp separation was normal.  ------------------------------------------------------------------- Tricuspid valve: Mild to moderate tricuspid regurgitation, peak RV-RA gradient 35 mmHg.  ------------------------------------------------------------------- Right atrium: The atrium was severely dilated.  ------------------------------------------------------------------- Pericardium: There was no pericardial effusion.  ------------------------------------------------------------------- Post procedure conclusions Ascending Aorta:  - Normal caliber aorta with grade III plaque in the descending  thoracic aorta.  ------------------------------------------------------------------- Measurements  Left ventricle Value Stroke volume, 2D 36 ml Stroke volume/bsa, 2D 20 ml/m^2  LVOT Value LVOT ID, S 21 mm LVOT area 3.46 cm^2 LVOT peak velocity, S  54.9 cm/s LVOT mean velocity, S 39.4 cm/s LVOT VTI, S 10.5 cm LVOT peak gradient, S 1 mm Hg  Aortic valve Value Aortic valve peak velocity, S 247 cm/s Aortic valve mean velocity, S 189 cm/s Aortic valve VTI, S 59.2 cm Aortic mean gradient, S 16 mm Hg Aortic peak gradient, S 24 mm Hg VTI ratio, LVOT/AV 0.18 Aortic valve area, VTI 0.61 cm^2 Aortic valve area/bsa, VTI 0.34 cm^2/m^2 Velocity ratio, peak, LVOT/AV 0.22 Aortic valve area, peak velocity 0.77 cm^2 Aortic valve area/bsa, peak velocity 0.43 cm^2/m^2 Velocity ratio, mean, LVOT/AV 0.21 Aortic valve area, mean velocity 0.72 cm^2 Aortic valve area/bsa, mean velocity 0.41 cm^2/m^2  Tricuspid valve Value Tricuspid regurg peak velocity 297 cm/s Tricuspid peak RV-RA gradient 35 mm Hg  Legend: (L) and (H) mark values outside specified reference range.  ------------------------------------------------------------------- Prepared and Electronically Authenticated by  Loralie Champagne, M.D. 2017-03-07T16:48:52       Impression:  Patient has stage D severe symptomatic primary mitral regurgitation with at least moderate aortic stenosis. She presents with progressive symptoms of exertional shortness of breath, orthopnea, PND, and lower extremity edema consistent with chronic combined systolic and diastolic congestive heart failure. In January she presented with an acute exacerbation of symptoms, [place] Heart Association function class IIIB. More recently she has been doing much better on medical therapy.  She remains  chronically anticoagulated on warfarin for long-standing persistent atrial fibrillation. I have personally reviewed the patient's recent echocardiograms and diagnostic cardiac catheterization. Transesophageal echocardiogram demonstrates the presence of severe mitral regurgitation that was not appreciated on previous transthoracic studies. There is what appears to be a small flail segment of the anterior leaflet with type II dysfunction and an eccentric jet of mitral regurgitation that courses posteriorly around the left atrium.  This may be further exacerbated by calcification of the posterior mitral annulus and significant restriction of posterior leaflet mobility (type IIIA dysfunction). There is severe left atrial enlargement and moderate global left ventricular systolic dysfunction. There is probably at least moderate aortic stenosis.   Peak velocity across the aortic valve was measured 4.4 m/s on transthoracic echocardiogram but only 2.5 m/s by TEE.  There is moderate thickening with restricted leaflet mobility involving all 3 leaflets of the patient's aortic valve.  I would not consider proceeding with mitral valve repair or replacement without concomitant surgical treatment for aortic stenosis.  Diagnostic cardiac catheterization is notable for the absence  of significant coronary artery disease and mild pulmonary hypertension.  Risks associated with aortic valve replacement and mitral valve repair or replacement with or without concomitant Maze procedure would be at least moderately elevated because of the patient's advanced age and somewhat debilitated physical condition.  However, both images from the patient's recent diagnostic cardiac catheterization and chest x-ray performed last January suggests that she may have significant calcification of the ascending thoracic aorta.   Plan:  I have reviewed the patient's clinical history and results of recent echocardiograms and cardiac catheterization with  the patient and her close friend in the office today. We discussed the indications, risks, and potential benefits of aortic valve replacement with mitral valve repair or replacement. We discussed the natural history of aortic stenosis and mitral regurgiation and the long-term prognosis with continued medical therapy.  Alternative treatment strategies of discussed. We discussed the potential implications of significant calcification of the ascending thoracic aorta and the need for further diagnostic testing prior to making a definitive decision regarding treatment plan.  We will obtain CT angiogram of the thoracic and abdominal aorta. We will also obtain pulmonary function tests given the patient's history of significant tobacco abuse in the past. The patient will return in 2 weeks to discuss treatment options further.   I spent in excess of 90 minutes during the conduct of this office consultation and >50% of this time involved direct face-to-face encounter with the patient for counseling and/or coordination of their care.    Valentina Gu. Roxy Manns, MD 01/12/2016 6:11 PM

## 2016-01-12 NOTE — Patient Instructions (Signed)
Continue all previous medications without any changes at this time  

## 2016-01-18 ENCOUNTER — Other Ambulatory Visit: Payer: Commercial Managed Care - HMO

## 2016-01-18 ENCOUNTER — Encounter: Payer: Self-pay | Admitting: *Deleted

## 2016-01-18 ENCOUNTER — Encounter (HOSPITAL_COMMUNITY): Payer: Commercial Managed Care - HMO

## 2016-01-20 ENCOUNTER — Inpatient Hospital Stay: Admission: RE | Admit: 2016-01-20 | Payer: Commercial Managed Care - HMO | Source: Ambulatory Visit

## 2016-01-20 ENCOUNTER — Encounter (HOSPITAL_COMMUNITY): Payer: Commercial Managed Care - HMO

## 2016-01-30 ENCOUNTER — Encounter: Payer: Commercial Managed Care - HMO | Admitting: Thoracic Surgery (Cardiothoracic Vascular Surgery)

## 2016-02-02 ENCOUNTER — Ambulatory Visit
Admission: RE | Admit: 2016-02-02 | Discharge: 2016-02-02 | Disposition: A | Discharge status: Home / Self Care | Payer: Medicare HMO | Source: Ambulatory Visit | Attending: Thoracic Surgery (Cardiothoracic Vascular Surgery) | Admitting: Thoracic Surgery (Cardiothoracic Vascular Surgery)

## 2016-02-02 ENCOUNTER — Ambulatory Visit (HOSPITAL_COMMUNITY)
Admission: RE | Admit: 2016-02-02 | Discharge: 2016-02-02 | Disposition: A | Discharge status: Home / Self Care | Payer: Medicare HMO | Source: Ambulatory Visit | Attending: Thoracic Surgery (Cardiothoracic Vascular Surgery) | Admitting: Thoracic Surgery (Cardiothoracic Vascular Surgery)

## 2016-02-02 DIAGNOSIS — I712 Thoracic aortic aneurysm, without rupture, unspecified: Secondary | ICD-10-CM

## 2016-02-02 DIAGNOSIS — I350 Nonrheumatic aortic (valve) stenosis: Secondary | ICD-10-CM | POA: Insufficient documentation

## 2016-02-02 DIAGNOSIS — I748 Embolism and thrombosis of other arteries: Secondary | ICD-10-CM | POA: Insufficient documentation

## 2016-02-02 DIAGNOSIS — R911 Solitary pulmonary nodule: Secondary | ICD-10-CM

## 2016-02-02 DIAGNOSIS — I7409 Other arterial embolism and thrombosis of abdominal aorta: Secondary | ICD-10-CM

## 2016-02-02 HISTORY — DX: Solitary pulmonary nodule: R91.1

## 2016-02-02 LAB — PULMONARY FUNCTION TEST
DL/VA % PRED: 86 %
DL/VA: 3.66 ml/min/mmHg/L
DLCO UNC % PRED: 50 %
DLCO UNC: 9.55 ml/min/mmHg
FEF 25-75 POST: 0.39 L/s
FEF 25-75 PRE: 0.51 L/s
FEF2575-%Change-Post: -22 %
FEF2575-%PRED-POST: 32 %
FEF2575-%Pred-Pre: 41 %
FEV1-%Change-Post: -4 %
FEV1-%Pred-Post: 47 %
FEV1-%Pred-Pre: 49 %
FEV1-POST: 0.76 L
FEV1-Pre: 0.8 L
FEV1FVC-%Change-Post: 3 %
FEV1FVC-%PRED-PRE: 89 %
FEV6-%CHANGE-POST: -7 %
FEV6-%PRED-POST: 54 %
FEV6-%Pred-Pre: 59 %
FEV6-POST: 1.11 L
FEV6-Pre: 1.21 L
FEV6FVC-%PRED-POST: 106 %
FEV6FVC-%Pred-Pre: 106 %
FVC-%Change-Post: -7 %
FVC-%PRED-POST: 51 %
FVC-%Pred-Pre: 55 %
FVC-Post: 1.11 L
FVC-Pre: 1.21 L
PRE FEV1/FVC RATIO: 66 %
Post FEV1/FVC ratio: 68 %
Post FEV6/FVC ratio: 100 %
Pre FEV6/FVC Ratio: 100 %
RV % pred: 159 %
RV: 3.45 L
TLC % PRED: 122 %
TLC: 5.45 L

## 2016-02-02 MED ORDER — ALBUTEROL SULFATE (2.5 MG/3ML) 0.083% IN NEBU
2.5000 mg | INHALATION_SOLUTION | Freq: Once | RESPIRATORY_TRACT | Status: AC
  Administered 2016-02-02: 2.5 mg via RESPIRATORY_TRACT

## 2016-02-02 MED ORDER — IOPAMIDOL (ISOVUE-370) INJECTION 76%
50.0000 mL | Freq: Once | INTRAVENOUS | Status: AC | PRN
  Administered 2016-02-02: 50 mL via INTRAVENOUS

## 2016-02-06 ENCOUNTER — Encounter: Payer: Commercial Managed Care - HMO | Admitting: Thoracic Surgery (Cardiothoracic Vascular Surgery)

## 2016-02-20 ENCOUNTER — Encounter: Payer: Commercial Managed Care - HMO | Admitting: Thoracic Surgery (Cardiothoracic Vascular Surgery)

## 2016-02-20 ENCOUNTER — Ambulatory Visit (INDEPENDENT_AMBULATORY_CARE_PROVIDER_SITE_OTHER): Payer: Medicare HMO | Admitting: Thoracic Surgery (Cardiothoracic Vascular Surgery)

## 2016-02-20 ENCOUNTER — Encounter: Payer: Self-pay | Admitting: Thoracic Surgery (Cardiothoracic Vascular Surgery)

## 2016-02-20 VITALS — BP 126/75 | HR 83 | Resp 20 | Ht 61.0 in | Wt 154.0 lb

## 2016-02-20 DIAGNOSIS — I080 Rheumatic disorders of both mitral and aortic valves: Secondary | ICD-10-CM

## 2016-02-20 DIAGNOSIS — I272 Other secondary pulmonary hypertension: Secondary | ICD-10-CM | POA: Diagnosis not present

## 2016-02-20 DIAGNOSIS — I482 Chronic atrial fibrillation, unspecified: Secondary | ICD-10-CM

## 2016-02-20 DIAGNOSIS — I350 Nonrheumatic aortic (valve) stenosis: Secondary | ICD-10-CM

## 2016-02-20 DIAGNOSIS — I5022 Chronic systolic (congestive) heart failure: Secondary | ICD-10-CM

## 2016-02-20 DIAGNOSIS — I700 Atherosclerosis of aorta: Secondary | ICD-10-CM

## 2016-02-20 DIAGNOSIS — I5042 Chronic combined systolic (congestive) and diastolic (congestive) heart failure: Secondary | ICD-10-CM

## 2016-02-20 HISTORY — DX: Atherosclerosis of aorta: I70.0

## 2016-02-20 NOTE — Progress Notes (Signed)
[address]       [name] [address]             [phone]     CARDIOTHORACIC SURGERY OFFICE NOTE  Referring Provider is Larey Dresser, MD PCP is Curly Rim, MD   HPI:  Patient returns to the office today for follow-up of severe symptom attic primary mitral regurgitation with at least moderate aortic stenosis. She was originally seen in consultation on 01/12/2016. Since then she underwent CT angiography and pulmonary function testing. She returns to the office today with her close friend present for consultation to discuss treatment options further. She reports no significant changes over the last month. Specifically, she states that she is doing quite well with stable symptoms of exertional shortness of breath and only occur with more strenuous physical activity. Overall she has been doing much better over the last couple of months since she was started on Entresto.   Current Outpatient Prescriptions  Medication Sig Dispense Refill  . atorvastatin (LIPITOR) 20 MG tablet Take 0.5 tablets (10 mg total) by mouth daily at 6 PM. 45 tablet 3  . Calcium Carbonate-Vit D-Min 600-400 MG-UNIT TABS Take 1 tablet by mouth daily.     . carvedilol (COREG) 25 MG tablet Take 1 tablet (25 mg total) by mouth 2 (two) times daily with a meal. 180 tablet 3  . Cinnamon 500 MG capsule Take 500 mg by mouth daily.      . dorzolamide-timolol (COSOPT) 22.3-6.8 MG/ML ophthalmic solution Place 1 drop into both eyes 2 (two) times daily.     . furosemide (LASIX) 40 MG tablet Take 1.5 tabs in AM and 1 tab in pM 225 tablet 3  . glipiZIDE (GLUCOTROL XL) 10 MG 24 hr tablet Take 1 tablet (10 mg total) by mouth daily. 90 tablet 1  . glucose blood (ACCU-CHEK AVIVA PLUS) test strip Use as instructed 100 each 11  . KLOR-CON M20 20 MEQ tablet Take 2 tablets (40 mEq total) by mouth daily. 180 tablet 3  . latanoprost (XALATAN) 0.005 % ophthalmic solution Place 1 drop into both eyes at bedtime.      Marland Kitchen linagliptin (TRADJENTA) 5 MG TABS tablet Take 1 tablet (5 mg total) by mouth daily. 30 tablet 5  . magnesium oxide (MAG-OX) 400 MG tablet Take 400 mg by mouth daily.    . metFORMIN (GLUCOPHAGE-XR) 500 MG 24 hr tablet Take 2 tablets (1,000 mg total) by mouth daily with breakfast. (Patient taking differently: Take 1,000 mg by mouth at bedtime. ) 60 tablet 5  . Multiple Vitamins-Minerals (MULTIVITAL) tablet Take 1 tablet by mouth daily.      . Omega-3 Fatty Acids (FISH OIL) 1000 MG CAPS Take 1 capsule by mouth daily.     . sacubitril-valsartan (ENTRESTO) 24-26 MG Take 1 tablet by mouth 2 (two) times daily. 60 tablet 3  . spironolactone (ALDACTONE) 25 MG tablet Take 0.5 tablets (12.5 mg total) by mouth daily. 15 tablet 3  . vitamin E 400 UNIT capsule Take 400 Units by mouth daily.      Marland Kitchen warfarin (COUMADIN) 5 MG tablet Take 1 tablet (5 mg total) by mouth daily. Takes 2.5mg  on Monday, and 5mg  all other days. (Patient taking differently: Take 5 mg by mouth daily. Takes 2.5mg  on Monday,wednesday and friday and 5mg  all other days.) 90 tablet 3   No current facility-administered medications for this visit.      Physical Exam:   BP 126/75 mmHg  Pulse 83  Resp 20  Ht 5\' 1"  (1.549 m)  Wt 154 lb (69.854 kg)  BMI 29.11 kg/m2  SpO2 95%  General:  Elderly but well-appearing  Chest:   Clear to auscultation  CV:   Regular rate and rhythm with harsh systolic murmur along the sternal border and blowing systolic murmur at the apex  Incisions:  n/a  Abdomen:  Soft nontender  Extremities:  Warm and adequately perfused  Diagnostic Tests:  CT ANGIOGRAPHY CHEST, ABDOMEN AND PELVIS  TECHNIQUE: Multidetector CT imaging through the chest, abdomen and pelvis was performed using the standard protocol during bolus administration of intravenous contrast. Multiplanar reconstructed images and MIPs were obtained and reviewed to evaluate the vascular anatomy.  CONTRAST: 50 mL Isovue 370 IV  Creatinine  was obtained on site at [facility] at [address]  Results: Creatinine 1.2 mg/dL. Estimated GFR of 43 mL/minute.  COMPARISON: Chest x-ray on 10/20/2015 and CT of the chest, abdomen and pelvis on 07/13/2014.  FINDINGS: CTA CHEST FINDINGS  Cardiovascular: The thoracic aorta shows diffuse calcification without evidence of aneurysmal disease or dissection. Maximal caliber of the ascending thoracic aorta is 3.5 cm. The proximal arch measures 2.8 cm. The distal arch measures 2.6 cm. The descending thoracic aorta measures 2.5 cm. Proximal great vessels demonstrate calcified plaque without evidence of significant obstructive disease or aneurysmal disease.  The pulmonary arteries are not well opacified. The heart is moderately enlarged with severe dilatation of the left atrium. AP diameter of the mid left atrium measures 7.3 cm. No evidence of pericardial fluid. Coronary arterial calcifications seen in a 3 vessel distribution.  Mediastinum/Nodes: No mediastinal lymphadenopathy or masses. No enlarged hilar or axillary lymph nodes. There is a moderate-sized hiatal hernia.  Lungs/Pleura: There is some progressive scarring and reticulonodular opacity in the left upper lobe and lingula extending to the anterior pleural surface. There is some associated mild traction bronchiectasis in the lingula. Findings are suggestive of postinflammatory/postinfectious changes. Recommend correlation with any active infectious symptoms. CT follow-up would be appropriate in approximately 6 months.  Musculoskeletal: Mild spondylosis present of the thoracic spine. No fractures or bony lesions in the chest.  Visualized neck: The thyroid gland has a multinodular appearance by CT with a roughly 1.0 cm nodule on the right and a roughly 1.2 cm nodule in the isthmus/left lobe. Consider elective sonographic characterization.  Review of the MIP images confirms the above  findings.  CTA ABDOMEN AND PELVIS FINDINGS  Vascular: The abdominal aorta is calcified without evidence of aneurysmal disease. Calcified plaque at the origin of the celiac axis causes at least 50% stenosis and likely more in the range of 70% narrowing. Other visceral arteries demonstrate proximal calcified plaque including the SMA, bilateral single renal arteries and IMA. No significant stenoses of any of these other vessels is identified.  There is mild fusiform dilatation of the right common iliac artery which measures approximately 14 mm in greatest diameter. The left common iliac artery measures approximately 13 mm. Bilateral external and internal iliac arteries show no significant obstructive disease. The common femoral arteries are normally patent bilaterally.  Hepatobiliary: Multiple calcified gallstones again identified. No evidence of biliary obstruction.  Pancreas: Stable cysts of the head of the pancreas and tail of the pancreas.  Spleen: Normal in size.  Adrenals/Urinary Tract: No hydronephrosis or renal masses.  Stomach/Bowel: No evidence of bowel obstruction or inflammation. No free air, free fluid or focal abscess.  Lymphatic: No enlarged lymph nodes are seen.  Other:  Large left-sided inguinal hernia again identified containing fat and a short segment of the descending colon. Stable calcified ovarian tissue bilaterally with more prominent left-sided calcifications.  Musculoskeletal: Interval compression fracture at the L1 level since prior CT with approximately 60- 70% maximal loss of vertebral body height. Progressive spondylosis noted at L2-3, L3-4, L4-5 and L5-S1 since prior CT. There is an associated mild anterolisthesis of L4 on L5 of approximately 6 mm. No bony lesions are seen.  Review of the MIP images confirms the above findings.  IMPRESSION: 1. Calcified thoracic and abdominal aorta without evidence of aneurysmal disease or  significant obstructive disease. 2. Progressive scarring and reticulonodular disease in the left upper lobe and lingula since prior CT in 2015. While most likely postinfectious/postinflammatory in nature, CT follow-up is recommended in approximately 6 months. 3. Enlarged left atrium and coronary arterial calcifications. 4. Multinodular thyroid goiter with nodules on the order of 1 cm in size by CT. Consider elective ultrasound correlation. 5. 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 6. Stable cholelithiasis. 7. Stable pancreatic cyst. 8. Stable large left inguinal hernia containing a segment of the descending colon. 9. Progressive loss of height of the L1 vertebral body since 2015. Progressive spondylosis of the lumbar spine also identified with grade 1 anterolisthesis of L4 on L5.   Electronically Signed  By: Aletta Edouard M.D.  On: 02/02/2016 16:06   Pulmonary Function Tests  Baseline      Post-bronchodilator  FVC  1.21 L  (55% predicted) FVC  1.11 L  (51% predicted) FEV1  0.80 L  (49% predicted) FEV1  0.76 L  (47% predicted) FEF25-75 0.51 L  (41% predicted) FEF25-75 0.39 L  (32% predicted)  TLC  5.45 L  (122% predicted) RV  3.45 L  (159% predicted) DLCO  50% predicted    Impression:  Patient has stage D severe symptomatic primary mitral regurgitation with at least moderate aortic stenosis. She presents with progressive symptoms of exertional shortness of breath, orthopnea, PND, and lower extremity edema consistent with chronic combined systolic and diastolic congestive heart failure. In January she presented with an acute exacerbation of symptoms, New York Heart Association functional class IIIB. More recently she has been doing much better on medical therapy and in fact the patient has not been quite stable for the past 2 months. The patient's symptoms are likely further exacerbated by the presence of significant chronic lung disease.  Pulmonary function tests confirm  the presence of at least moderate COPD.    CT angiography demonstrates the presence of severe calcification of the ascending and descending thoracic aorta and the abdominal aorta.  There is no significant aneurysmal enlargement and there is no significant flow limiting obstruction. However, the degree of disease and calcification is quite extensive, essentially consistent with nearly complete porcelain aorta. Under the circumstances, risks associated with conventional surgical aortic valve replacement with mitral valve repair or replacement would be unacceptably high.  I did not feel that the patient should be considered a candidate for surgery using conventional techniques. Mitral valve repair or replacement could be considered via right mini thoracotomy approach under cold fibrillatory arrest. However, I would not consider this an option in the setting of coexistent moderate aortic stenosis.  Under the circumstances, it might be reasonable to consider palliative percutaneous edge to edge mitral valve repair using a MitraClip.  Transcatheter aortic valve replacement could be considered if the patient develops findings consistent with severe aortic stenosis.     Plan:  I've discussed matters at length  with the patient and her close friend in the office today. We discussed the presence of severe calcification involving her entire thoracic aorta and the reasons why risks associated with conventional surgery would be extremely high. At this point the patient maintains that she seems to be doing quite well on her current regimen of medical therapy. The circumstances, close observation on medical therapy might be most appropriate. We will review the results of her recent echocardiograms with the multidisciplinary team at specialists and consider referral to a tertiary care center where the MitraClip procedure is routinely performed.  All of her questions have been addressed.  I spent in excess of 15 minutes  during the conduct of this office consultation and >50% of this time involved direct face-to-face encounter with the patient for counseling and/or coordination of their care.    Valentina Gu. Roxy Manns, MD 02/20/2016 5:38 PM

## 2016-02-20 NOTE — Patient Instructions (Signed)
Continue all previous medications without any changes at this time  Keep your previously scheduled appointment with Dr Aundra Dubin

## 2016-03-01 ENCOUNTER — Ambulatory Visit (HOSPITAL_COMMUNITY)
Admission: RE | Admit: 2016-03-01 | Discharge: 2016-03-01 | Disposition: A | Discharge status: Home / Self Care | Payer: Medicare HMO | Source: Ambulatory Visit | Attending: Cardiology | Admitting: Cardiology

## 2016-03-01 ENCOUNTER — Ambulatory Visit: Payer: Commercial Managed Care - HMO | Admitting: Family Medicine

## 2016-03-01 ENCOUNTER — Encounter (HOSPITAL_COMMUNITY): Payer: Self-pay

## 2016-03-01 VITALS — BP 128/80 | HR 96 | Wt 160.5 lb

## 2016-03-01 DIAGNOSIS — I080 Rheumatic disorders of both mitral and aortic valves: Secondary | ICD-10-CM | POA: Diagnosis not present

## 2016-03-01 DIAGNOSIS — Z7901 Long term (current) use of anticoagulants: Secondary | ICD-10-CM | POA: Diagnosis not present

## 2016-03-01 DIAGNOSIS — Z79899 Other long term (current) drug therapy: Secondary | ICD-10-CM | POA: Insufficient documentation

## 2016-03-01 DIAGNOSIS — I350 Nonrheumatic aortic (valve) stenosis: Secondary | ICD-10-CM

## 2016-03-01 DIAGNOSIS — Z96651 Presence of right artificial knee joint: Secondary | ICD-10-CM | POA: Diagnosis not present

## 2016-03-01 DIAGNOSIS — Z87891 Personal history of nicotine dependence: Secondary | ICD-10-CM | POA: Diagnosis not present

## 2016-03-01 DIAGNOSIS — J449 Chronic obstructive pulmonary disease, unspecified: Secondary | ICD-10-CM | POA: Insufficient documentation

## 2016-03-01 DIAGNOSIS — I428 Other cardiomyopathies: Secondary | ICD-10-CM | POA: Insufficient documentation

## 2016-03-01 DIAGNOSIS — I482 Chronic atrial fibrillation, unspecified: Secondary | ICD-10-CM

## 2016-03-01 DIAGNOSIS — I5022 Chronic systolic (congestive) heart failure: Secondary | ICD-10-CM | POA: Diagnosis not present

## 2016-03-01 DIAGNOSIS — Z7984 Long term (current) use of oral hypoglycemic drugs: Secondary | ICD-10-CM | POA: Insufficient documentation

## 2016-03-01 DIAGNOSIS — I5042 Chronic combined systolic (congestive) and diastolic (congestive) heart failure: Secondary | ICD-10-CM

## 2016-03-01 DIAGNOSIS — E119 Type 2 diabetes mellitus without complications: Secondary | ICD-10-CM | POA: Insufficient documentation

## 2016-03-01 DIAGNOSIS — I110 Hypertensive heart disease with heart failure: Secondary | ICD-10-CM | POA: Insufficient documentation

## 2016-03-01 DIAGNOSIS — I272 Other secondary pulmonary hypertension: Secondary | ICD-10-CM | POA: Diagnosis not present

## 2016-03-01 LAB — BASIC METABOLIC PANEL
Anion gap: 7 (ref 5–15)
BUN: 15 mg/dL (ref 6–20)
CHLORIDE: 104 mmol/L (ref 101–111)
CO2: 29 mmol/L (ref 22–32)
CREATININE: 0.88 mg/dL (ref 0.44–1.00)
Calcium: 10.1 mg/dL (ref 8.9–10.3)
GFR calc non Af Amer: >60 mL/min (ref >60)
Glucose, Bld: 175 mg/dL — ABNORMAL HIGH (ref 65–99)
POTASSIUM: 4.1 mmol/L (ref 3.5–5.1)
SODIUM: 140 mmol/L (ref 135–145)

## 2016-03-01 LAB — BRAIN NATRIURETIC PEPTIDE: B NATRIURETIC PEPTIDE 5: 258.5 pg/mL — AB (ref 0.0–100.0)

## 2016-03-01 MED ORDER — FUROSEMIDE 40 MG PO TABS: ORAL_TABLET | ORAL | Status: DC

## 2016-03-01 MED ORDER — SPIRONOLACTONE 25 MG PO TABS: 25.0000 mg | ORAL_TABLET | Freq: Every day | ORAL | Status: DC

## 2016-03-01 MED ORDER — TIOTROPIUM BROMIDE MONOHYDRATE 18 MCG IN CAPS: 18.0000 ug | ORAL_CAPSULE | Freq: Every day | RESPIRATORY_TRACT | Status: AC

## 2016-03-01 NOTE — Patient Instructions (Signed)
Increase Furosemide (Lasix) to 80 mg (2 tabs) in AM and 60 mg (1.5 tabs) in PM FOR 4 DAYS ONLY, then take 80 mg (2 tabs) in AM and 40 mg (1 tab) in PM  Increase Spironolactone to 25 mg (1 tab) daily  Labs today  Labs in 10 days  Your physician recommends that you schedule a follow-up appointment in: 1 month

## 2016-03-02 ENCOUNTER — Other Ambulatory Visit (HOSPITAL_COMMUNITY): Payer: Self-pay | Admitting: *Deleted

## 2016-03-02 MED ORDER — FUROSEMIDE 40 MG PO TABS: ORAL_TABLET | ORAL | Status: DC

## 2016-03-02 MED ORDER — SPIRONOLACTONE 25 MG PO TABS: 25.0000 mg | ORAL_TABLET | Freq: Every day | ORAL | Status: DC

## 2016-03-02 NOTE — Progress Notes (Signed)
Patient ID: [place] [name], female   DOB: [birth year], 81 y.o.   MRN: [id number] PCP: Dr Raoul Pitch Cardiology: Dr. Johnsie Cancel HF Cardiology: Dr. Aundra Dubin  81 yo with chronic atrial fibrillation and chronic systolic CHF presents for CHF clinic followup.  She has been in atrial fibrillation long-term.  However, she had been more short of breath with exertion since 9/15.  This gradually worsened.  Echo in 3/15 actually showed normal EF.  Given increased dyspnea, echo was done in 3/16.  This showed EF 30-35%, newly decreased.  At an earlier appt, she was short of breath walking 50-100 feet, making up her bed, carrying any load, and vacuuming.    I thought she was volume overloaded and increased Lasix/started Entresto. She initially did much better with significantly decreased dyspnea.    I did a right and left heart cath in 2/17.  This showed no significant CAD and normal right and left heart filling pressures.  There was mild pulmonary arterial HTN.  Echo was done also, showing EF 40-45% (improved) with moderate to severe aortic stenosis.   I did a TEE in 3/17.  This showed EF 45% with possible low gradient at least moderate AS and possible anterior mitral leaflet chordal rupture with concern for very eccentric, severe mitral regurgitation.    She saw Dr. Roxy Manns for cardiac surgery evaluation.  Initial plan was repair of mitral valve and replacement of aortic valve.  However, she was found on CTA chest to have a porcelain aorta and was thought to be too high risk for open heart surgery.  We are now trying to get her worked up for mitral valve clip.    Recently, she has been more short of breath.  She is out of breath walking to the car from her house.  Short of breath walking into the office today.  [place] with short distances.  No orthopnea/PND, no chest pain.  Not lightheaded.  Also limited by low back pain.   Labs (1/17): K 3.9, creatinine 0.63, BNP 198 Labs (2/17): K 4.4, creatinine 0.69 Labs (3/17): K 4.8,  creatinine 0.78  PMH: 1. HTN 2. DM type II 3. Chronic atrial fibrillation 4. Right TKR 12/10.  5. Chronic systolic CHF:  - Remote cath showed no significant CAD> - Echo (3/15) with EF 60-65%.  - Echo (3/16) with EF 30-35%, mild LVH, mild aortic stenosis mean gradient 13 mmHg, mild MR, RV mildly dilated with normal systolic function, moderate TR.  - LHC/RHC (2/17): minimal luminal irregularities in coronaries.  Mean RA 4, PA 57/22, PCWP mean 15, PVR 2.7 WU, CI 3.29. Peak-to-peak aortic valve gradient 21 mmHg.  - Echo (2/17) with EF 40-45%, moderate LV hypertrophy, mild MR, normal RV size and systolic function, PASP 45 mmHg, moderate TR, moderate-severe aortic stenosis with mean gradient 30 mmHg, peak 78 mmHg, AVA 0.8 cm^2.  - TEE (3/17) with EF 45%, severely calcified aortic valve with mean gradient 16 mmHg but AVA 0.7 cm^2 (aortic gradient likely underestimated based on 2/17 TTE => ?low gradient severe AS versus pseudostenosis), possible anterior mitral leaflet chordal rupture with very eccentric, posteriorly-directed mitral regurgitation (Coanda effect).   6. Aortic stenosis: Low gradient at least moderate AS.   7. Mitral regurgitation: Appears severe by TEE with possible chordal rupture.   8. Porcelain aorta. 9. COPD: PFTs (4/17) with FVC 55%, FEV1 49%, radio 89%, DLCO 50% => moderate to severe obstructive lung disease.   FH: Atrial fibrillation.  No CAD.  SH: Retired, widow, lives in  [facility], quit smoking 1989, originally from [place], has 7 kids.   ROS: All systems reviewed and negative except as per HPI.   Current Outpatient Prescriptions  Medication Sig Dispense Refill  . atorvastatin (LIPITOR) 20 MG tablet Take 0.5 tablets (10 mg total) by mouth daily at 6 PM. 45 tablet 3  . Calcium Carbonate-Vit D-Min 600-400 MG-UNIT TABS Take 1 tablet by mouth daily.     . carvedilol (COREG) 25 MG tablet Take 1 tablet (25 mg total) by mouth 2 (two) times daily with a meal. 180 tablet 3  .  Cinnamon 500 MG capsule Take 500 mg by mouth daily.      . dorzolamide-timolol (COSOPT) 22.3-6.8 MG/ML ophthalmic solution Place 1 drop into both eyes 2 (two) times daily.     Marland Kitchen glipiZIDE (GLUCOTROL XL) 10 MG 24 hr tablet Take 1 tablet (10 mg total) by mouth daily. 90 tablet 1  . glucose blood (ACCU-CHEK AVIVA PLUS) test strip Use as instructed 100 each 11  . KLOR-CON M20 20 MEQ tablet Take 2 tablets (40 mEq total) by mouth daily. 180 tablet 3  . latanoprost (XALATAN) 0.005 % ophthalmic solution Place 1 drop into both eyes at bedtime.     Marland Kitchen linagliptin (TRADJENTA) 5 MG TABS tablet Take 1 tablet (5 mg total) by mouth daily. 30 tablet 5  . magnesium oxide (MAG-OX) 400 MG tablet Take 400 mg by mouth daily.    . metFORMIN (GLUCOPHAGE-XR) 500 MG 24 hr tablet Take 2 tablets (1,000 mg total) by mouth daily with breakfast. (Patient taking differently: Take 1,000 mg by mouth at bedtime. ) 60 tablet 5  . Multiple Vitamins-Minerals (MULTIVITAL) tablet Take 1 tablet by mouth daily.      . Omega-3 Fatty Acids (FISH OIL) 1000 MG CAPS Take 1 capsule by mouth daily.     . sacubitril-valsartan (ENTRESTO) 24-26 MG Take 1 tablet by mouth 2 (two) times daily. 60 tablet 3  . vitamin E 400 UNIT capsule Take 400 Units by mouth daily.      Marland Kitchen warfarin (COUMADIN) 5 MG tablet Take 1 tablet (5 mg total) by mouth daily. Takes 2.5mg  on Monday, and 5mg  all other days. (Patient taking differently: Take 5 mg by mouth daily. Takes 2.5mg  on Monday,wednesday and friday and 5mg  all other days.) 90 tablet 3  . furosemide (LASIX) 40 MG tablet Take 2 tabs in AM and 1 tab in pM 270 tablet 3  . spironolactone (ALDACTONE) 25 MG tablet Take 1 tablet (25 mg total) by mouth daily. 90 tablet 3  . tiotropium (SPIRIVA HANDIHALER) 18 MCG inhalation capsule Place 1 capsule (18 mcg total) into inhaler and inhale daily. 30 capsule 3   No current facility-administered medications for this encounter.   BP 128/80 mmHg  Pulse 96  Wt 160 lb 8 oz  (72.802 kg)  SpO2 97% General: NAD Neck: JVP 10-11 cm, no thyromegaly or thyroid nodule.  Lungs: Clear to auscultation bilaterally with normal respiratory effort. CV: Nondisplaced PMI.  Heart irregular S1/S2, no [id number], 3/6 systolic crescendo-decrescendo murmur RUSB with S2 heard clearly, 3/6 HSM at apex.  1+ ankle edema.  No carotid bruit.  Normal pedal pulses.  Abdomen: Soft, nontender, no hepatosplenomegaly, no distention.  Skin: Intact without lesions or rashes.  Neurologic: Alert and oriented x 3.  Psych: Normal affect. Extremities: No clubbing or cyanosis.  HEENT: Normal.   Assessment/Plan: 1. Chronic systolic CHF: Echo (0000000) with EF 30-35%, diffuse hypokinesis, improved to 45% on 3/17 TEE.  Nonischemic cardiomyopathy, no significant coronary disease on angiography.  This could be a valvular cardiomyopathy with both aortic valve and mitral valve disease.  Today, she appears more volume overloaded than last appointment, NYHA class III symptoms now.   - Increase Lasix to 80 qam/60 qpm x 4 days then Lasix 80 qam/40 qpm long-term.   BMET/BNP today and repeat in 10 days.   - Continue Entresto and Coreg.  - Increase spironolactone to 25 mg daily.    2. Atrial fibrillation: Chronic.  Rate is controlled. She has been in atrial fibrillation for years, unlikely to be able to successfully cardiovert her. Continue warfarin.  3. Aortic stenosis: On 2/17 echo, severe aortic stenosis by AVA, moderate by mean gradient.  By cath, suspect moderate AS given relative ease of crossing valve.  TEE again showed low gradient probably moderate aortic stenosis.  4. Pulmonary hypertension: Mild to moderate pulmonary hypertension on RHC but PVR not significantly elevated.  I think a sleep study would be reasonable given pulmonary hypertension (awaiting).  5. Mitral regurgitation: Possible rupture of an anterior leaflet chord with very eccentric mitral regurgitation.  I think that the MR is severe.  I had her  evaluated by surgery for MV repair and AoV replacement.  However, given porcelain aorta, she was deemed not a candidate for open heart surgery.  I reviewed all studies with Dr Roxy Manns (cardiac surgery).  We determined that the best course here is likely going to be evaluation for percutaneous MV clipping.  She should be a candidate.  If AS worsens and becomes clearly severe, TAVR will be an option.  I am going to refer her to Dr. Bridgette Habermann at [facility] for evaluation for Mitraclip.  6. COPD: Moderate to severe by recent PFTs.  I am going to give her a trial of Spiriva.   Loralie Champagne 03/02/2016

## 2016-03-12 ENCOUNTER — Telehealth (HOSPITAL_COMMUNITY): Payer: Self-pay | Admitting: *Deleted

## 2016-03-12 NOTE — Telephone Encounter (Signed)
Pt's last OV note, Dr Guy Sandifer note and TEE report and disc mailed to:  Dr Gerlene Fee Atten: [name] [place] [id number]

## 2016-04-16 ENCOUNTER — Encounter (HOSPITAL_COMMUNITY): Payer: Medicare HMO

## 2016-04-25 ENCOUNTER — Encounter (HOSPITAL_COMMUNITY): Payer: Medicare HMO

## 2016-05-02 ENCOUNTER — Ambulatory Visit (HOSPITAL_COMMUNITY)
Admission: RE | Admit: 2016-05-02 | Discharge: 2016-05-02 | Disposition: A | Discharge status: Home / Self Care | Payer: Medicare HMO | Source: Ambulatory Visit | Attending: Cardiology | Admitting: Cardiology

## 2016-05-02 ENCOUNTER — Encounter (HOSPITAL_COMMUNITY): Payer: Self-pay

## 2016-05-02 VITALS — BP 126/82 | HR 120 | Wt 162.8 lb

## 2016-05-02 DIAGNOSIS — I272 Other secondary pulmonary hypertension: Secondary | ICD-10-CM | POA: Insufficient documentation

## 2016-05-02 DIAGNOSIS — I080 Rheumatic disorders of both mitral and aortic valves: Secondary | ICD-10-CM | POA: Diagnosis not present

## 2016-05-02 DIAGNOSIS — Z7901 Long term (current) use of anticoagulants: Secondary | ICD-10-CM | POA: Diagnosis not present

## 2016-05-02 DIAGNOSIS — I5022 Chronic systolic (congestive) heart failure: Secondary | ICD-10-CM | POA: Diagnosis not present

## 2016-05-02 DIAGNOSIS — Z87891 Personal history of nicotine dependence: Secondary | ICD-10-CM | POA: Insufficient documentation

## 2016-05-02 DIAGNOSIS — I428 Other cardiomyopathies: Secondary | ICD-10-CM | POA: Insufficient documentation

## 2016-05-02 DIAGNOSIS — I482 Chronic atrial fibrillation, unspecified: Secondary | ICD-10-CM

## 2016-05-02 DIAGNOSIS — Z96651 Presence of right artificial knee joint: Secondary | ICD-10-CM | POA: Insufficient documentation

## 2016-05-02 DIAGNOSIS — I5042 Chronic combined systolic (congestive) and diastolic (congestive) heart failure: Secondary | ICD-10-CM

## 2016-05-02 DIAGNOSIS — I110 Hypertensive heart disease with heart failure: Secondary | ICD-10-CM | POA: Insufficient documentation

## 2016-05-02 DIAGNOSIS — Z79899 Other long term (current) drug therapy: Secondary | ICD-10-CM | POA: Insufficient documentation

## 2016-05-02 DIAGNOSIS — Z7984 Long term (current) use of oral hypoglycemic drugs: Secondary | ICD-10-CM | POA: Insufficient documentation

## 2016-05-02 DIAGNOSIS — Z8249 Family history of ischemic heart disease and other diseases of the circulatory system: Secondary | ICD-10-CM | POA: Diagnosis not present

## 2016-05-02 DIAGNOSIS — E119 Type 2 diabetes mellitus without complications: Secondary | ICD-10-CM | POA: Insufficient documentation

## 2016-05-02 DIAGNOSIS — J449 Chronic obstructive pulmonary disease, unspecified: Secondary | ICD-10-CM | POA: Insufficient documentation

## 2016-05-02 DIAGNOSIS — I350 Nonrheumatic aortic (valve) stenosis: Secondary | ICD-10-CM | POA: Diagnosis not present

## 2016-05-02 LAB — BASIC METABOLIC PANEL
ANION GAP: 8 (ref 5–15)
BUN: 20 mg/dL (ref 6–20)
CALCIUM: 9.8 mg/dL (ref 8.9–10.3)
CO2: 28 mmol/L (ref 22–32)
Chloride: 102 mmol/L (ref 101–111)
Creatinine, Ser: 1.23 mg/dL — ABNORMAL HIGH (ref 0.44–1.00)
GFR calc Af Amer: 47 mL/min — ABNORMAL LOW (ref >60)
GFR calc non Af Amer: 41 mL/min — ABNORMAL LOW (ref >60)
GLUCOSE: 186 mg/dL — AB (ref 65–99)
Potassium: 4 mmol/L (ref 3.5–5.1)
Sodium: 138 mmol/L (ref 135–145)

## 2016-05-02 LAB — BRAIN NATRIURETIC PEPTIDE: B Natriuretic Peptide: 288 pg/mL — ABNORMAL HIGH (ref 0.0–100.0)

## 2016-05-02 MED ORDER — FUROSEMIDE 80 MG PO TABS: 80.0000 mg | ORAL_TABLET | Freq: Two times a day (BID) | ORAL | 6 refills | Status: DC

## 2016-05-02 MED ORDER — SACUBITRIL-VALSARTAN 24-26 MG PO TABS: 1.0000 | ORAL_TABLET | Freq: Two times a day (BID) | ORAL | 3 refills | Status: DC

## 2016-05-02 MED ORDER — CARVEDILOL 12.5 MG PO TABS: 12.5000 mg | ORAL_TABLET | Freq: Two times a day (BID) | ORAL | 6 refills | Status: DC

## 2016-05-02 NOTE — Patient Instructions (Signed)
Stop Metoprolol  Start Carvedilol 12.5 mg Twice daily   Start Entresto 24/26 mg Twice daily   Increase Furosemide (Lasix) to 80 mg Twice daily   Labs today  Labs in 1 week, home health RN will do this  You have been referred to Physical Therapy with [facility] physician has requested that you regularly monitor and record your blood pressure readings at home. Please use the same machine at the same time of day to check your readings and record them to bring to your follow-up visit.  Your physician recommends that you schedule a follow-up appointment in: 3 weeks

## 2016-05-02 NOTE — Progress Notes (Signed)
Patient ID: Destiny [name], female   DOB: 08-27-1935, 81 y.o.   MRN: [id number] PCP: Dr Raoul Pitch Cardiology: Dr. Johnsie Cancel HF Cardiology: Dr. Aundra Dubin  81 yo with chronic atrial fibrillation and chronic systolic CHF presents for CHF clinic followup.  She has been in atrial fibrillation long-term.  However, she had been more short of breath with exertion since 9/15.  This gradually worsened.  Echo in 3/15 actually showed normal EF.  Given increased dyspnea, echo was done in 3/16.  This showed EF 30-35%, newly decreased.  At an earlier appt, she was short of breath walking 50-100 feet, making up her bed, carrying any load, and vacuuming.    I thought she was volume overloaded and increased Lasix/started Entresto. She initially did much better with significantly decreased dyspnea.    I did a right and left heart cath in 2/17.  This showed no significant CAD and normal right and left heart filling pressures.  There was mild pulmonary arterial HTN.  Echo was done also, showing EF 40-45% (improved) with moderate to severe aortic stenosis.   I did a TEE in 3/17.  This showed EF 45% with possible low gradient at least moderate AS and possible anterior mitral leaflet chordal rupture with concern for very eccentric, severe mitral regurgitation.    She saw Dr. Roxy Manns for cardiac surgery evaluation.  Initial plan was repair of mitral valve and replacement of aortic valve.  However, she was found on CTA chest to have a porcelain aorta and was thought to be too high risk for open heart surgery.    She went down to [facility] and mitral valve clip was attempted.  This failed due to significant calcification of the valve and apparatus.   Her medications were changed because of low blood pressure post-procedure.   She is off Entresto and Coreg now.  She is more short of breath and using her walker.  She is short of breath after walking about 50 feet.  Generally doing ok in the house.  SBP now in the 120s.  No  orthopnea/PND, no chest pain.  Weight is up 2 lbs since last appointment.  HR 112, afib.    Labs (1/17): K 3.9, creatinine 0.63, BNP 198 Labs (2/17): K 4.4, creatinine 0.69 Labs (3/17): K 4.8, creatinine 0.78 Labs (5/17): K 4.1, creatinine 0.88, BNP 259  ECG: atrial fibrillation at 112, septal Qs  PMH: 1. HTN 2. DM type II 3. Chronic atrial fibrillation 4. Right TKR 12/10.  5. Chronic systolic CHF:  - Remote cath showed no significant CAD> - Echo (3/15) with EF 60-65%.  - Echo (3/16) with EF 30-35%, mild LVH, mild aortic stenosis mean gradient 13 mmHg, mild MR, RV mildly dilated with normal systolic function, moderate TR.  - LHC/RHC (2/17): minimal luminal irregularities in coronaries.  Mean RA 4, PA 57/22, PCWP mean 15, PVR 2.7 WU, CI 3.29. Peak-to-peak aortic valve gradient 21 mmHg.  - Echo (2/17) with EF 40-45%, moderate LV hypertrophy, mild MR, normal RV size and systolic function, PASP 45 mmHg, moderate TR, moderate-severe aortic stenosis with mean gradient 30 mmHg, peak 78 mmHg, AVA 0.8 cm^2.  - TEE (3/17) with EF 45%, severely calcified aortic valve with mean gradient 16 mmHg but AVA 0.7 cm^2 (aortic gradient likely underestimated based on 2/17 TTE => ?low gradient severe AS versus pseudostenosis), possible anterior mitral leaflet chordal rupture with very eccentric, posteriorly-directed mitral regurgitation (Coanda effect).   6. Aortic stenosis: Low gradient at least moderate AS.  7. Mitral regurgitation: Appears severe by TEE with possible chordal rupture.  MV clip attempted at [facility], failed.  8. Porcelain aorta. 9. COPD: PFTs (4/17) with FVC 55%, FEV1 49%, radio 89%, DLCO 50% => moderate to severe obstructive lung disease.   FH: Atrial fibrillation.  No CAD.  SH: Retired, widow, lives in [place], quit smoking 1989, originally from [place], has 7 kids.   ROS: All systems reviewed and negative except as per HPI.   Current Outpatient Prescriptions  Medication Sig Dispense  Refill  . atorvastatin (LIPITOR) 20 MG tablet Take 20 mg by mouth daily.    . Calcium Carbonate-Vit D-Min 600-400 MG-UNIT TABS Take 1 tablet by mouth daily.     . Cinnamon 500 MG capsule Take 500 mg by mouth daily.      . dorzolamide-timolol (COSOPT) 22.3-6.8 MG/ML ophthalmic solution Place 1 drop into both eyes 2 (two) times daily.     . furosemide (LASIX) 80 MG tablet Take 1 tablet (80 mg total) by mouth 2 (two) times daily. 60 tablet 6  . glipiZIDE (GLUCOTROL XL) 10 MG 24 hr tablet Take 1 tablet (10 mg total) by mouth daily. 90 tablet 1  . glucose blood (ACCU-CHEK AVIVA PLUS) test strip Use as instructed 100 each 11  . latanoprost (XALATAN) 0.005 % ophthalmic solution Place 1 drop into both eyes at bedtime.     Marland Kitchen linagliptin (TRADJENTA) 5 MG TABS tablet Take 1 tablet (5 mg total) by mouth daily. 30 tablet 5  . magnesium oxide (MAG-OX) 400 MG tablet Take 400 mg by mouth daily.    . metFORMIN (GLUCOPHAGE-XR) 500 MG 24 hr tablet Take 2 tablets (1,000 mg total) by mouth daily with breakfast. 60 tablet 5  . Multiple Vitamins-Minerals (MULTIVITAL) tablet Take 1 tablet by mouth daily.      . Omega-3 Fatty Acids (FISH OIL) 1000 MG CAPS Take 1 capsule by mouth daily.     . potassium chloride (KLOR-CON) 20 MEQ packet Take by mouth 2 (two) times daily.    Marland Kitchen spironolactone (ALDACTONE) 25 MG tablet Take 1 tablet (25 mg total) by mouth daily. 90 tablet 3  . tiotropium (SPIRIVA HANDIHALER) 18 MCG inhalation capsule Place 1 capsule (18 mcg total) into inhaler and inhale daily. 30 capsule 3  . vitamin E 400 UNIT capsule Take 400 Units by mouth daily.      Marland Kitchen warfarin (COUMADIN) 5 MG tablet Take 1 tablet (5 mg total) by mouth daily. Takes 2.5mg  on Monday, and 5mg  all other days. (Patient taking differently: Take 5 mg by mouth daily. Takes 2.5mg  on Monday,wednesday and friday and 5mg  all other days.) 90 tablet 3  . carvedilol (COREG) 12.5 MG tablet Take 1 tablet (12.5 mg total) by mouth 2 (two) times daily. 60  tablet 6  . sacubitril-valsartan (ENTRESTO) 24-26 MG Take 1 tablet by mouth 2 (two) times daily. 60 tablet 3   No current facility-administered medications for this encounter.    BP 126/82   Pulse (!) 120 Comment: abnormal  Wt 162 lb 12 oz (73.8 kg)   SpO2 95%   BMI 30.75 kg/m  General: NAD Neck: JVP 10 cm, no thyromegaly or thyroid nodule.  Lungs: Clear to auscultation bilaterally with normal respiratory effort. CV: Nondisplaced PMI.  Heart mildly tachy irregular S1/S2, no [id number], 3/6 systolic crescendo-decrescendo murmur RUSB with S2 heard clearly, 3/6 HSM at apex.  1+ ankle edema.  No carotid bruit.  Normal pedal pulses.  Abdomen: Soft, nontender, no hepatosplenomegaly, no  distention.  Skin: Intact without lesions or rashes.  Neurologic: Alert and oriented x 3.  Psych: Normal affect. Extremities: No clubbing or cyanosis.  HEENT: Normal.   Assessment/Plan: 1. Chronic systolic CHF: Echo (0000000) with EF 30-35%, diffuse hypokinesis, improved to 45% on 3/17 TEE.  Nonischemic cardiomyopathy, no significant coronary disease on angiography.  This could be a valvular cardiomyopathy with both aortic valve and mitral valve disease.  Today, she appears volume overloaded, NYHA class III symptoms now.  Meds were recently changed after admission at [facility] for MV clip. - Increase Lasix to 80 mg bid.  BMET/BNP today and again in 10 days.    - Stop metoprolol tartrate, restart Coreg at 12.5 mg bid.  Can increase back to prior dose 25 mg bid at next appointment if she tolerates.  - Restart Entresto 24/26 bid.  Check BP daily, call if she is lightheaded or SBP < 100.  - Continue spironolactone.     2. Atrial fibrillation: Chronic.  She has been in atrial fibrillation for years, unlikely to be able to successfully cardiovert her. Continue warfarin. Rate is high today but only on low dose metoprolol tartrate now, was on Coreg in the past. Stop metoprolol and restart Coreg as above.  3. Aortic stenosis: On 2/17  echo, severe aortic stenosis by AVA, moderate by mean gradient.  By cath, suspect moderate AS given relative ease of crossing valve.  TEE again showed low gradient probably moderate aortic stenosis.  4. Pulmonary hypertension: Mild to moderate pulmonary hypertension on RHC but PVR not significantly elevated.  I think a sleep study would be reasonable given pulmonary hypertension but she does not want to do one.  5. Mitral regurgitation: Suspect rupture of an anterior leaflet chord with very eccentric mitral regurgitation.  MR is severe.  I had her evaluated by surgery for MV repair and AoV replacement.  However, given porcelain aorta, she was deemed not a candidate for open heart surgery. I sent her for percutaneous MV clipping.  This was attempted by Dr Bridgette Habermann at [facility] but procedure failed due to the pattern of MV calcification.  Unfortunately, we do not have good options for treatment of her valvular disease at this point beyond medical management.   6. COPD: Moderate to severe by recent PFTs.  She is using Spiriva.  7. PT for balance training to be ordered.  Followup in 2-3 weeks.  Loralie Champagne 05/02/2016

## 2016-05-03 ENCOUNTER — Telehealth (HOSPITAL_COMMUNITY): Payer: Self-pay | Admitting: *Deleted

## 2016-05-03 NOTE — Telephone Encounter (Signed)
Enrolled Ms. Mcvay in [employer] so that she will have $800 toward Entresto copay costs through 05/02/2016. Relayed info to [employer] who verified $0 copay.   Billing ID: [id number] Person Code: [id number] RX Group: [id number] RX BIN: [id number] PCN for Part D: [id number]   [name]. Velva Harman, PharmD, BCPS, CPP Clinical Pharmacist Pager: [phone] Phone: [phone] 05/03/2016 2:50 PM

## 2016-05-03 NOTE — Telephone Encounter (Signed)
Pt called to let us know that she could not afford her Entresto. She has [employer] and was paying only $47 a month and now $173.86.  I told her that I would send this message to our pharmacist to take a look at.

## 2016-05-07 ENCOUNTER — Telehealth: Payer: Self-pay | Admitting: Cardiology

## 2016-05-07 NOTE — Telephone Encounter (Signed)
Heart Failure patient, will forward

## 2016-05-07 NOTE — Telephone Encounter (Signed)
New Message:    Does pt need Out Pt Physical or Home Health Phusical Therapy? What labs does he want her to repeat?

## 2016-05-09 ENCOUNTER — Telehealth (HOSPITAL_COMMUNITY): Payer: Self-pay

## 2016-05-09 NOTE — Telephone Encounter (Signed)
Patient left VM on CHF clinic triage line to call her back, did not specify. Attempted to return patient's call, no answer, no VM available to leave a message for patient.  Renee Pain RN

## 2016-05-11 ENCOUNTER — Encounter: Payer: Self-pay | Admitting: Cardiology

## 2016-05-23 ENCOUNTER — Ambulatory Visit (HOSPITAL_COMMUNITY)
Admission: RE | Admit: 2016-05-23 | Discharge: 2016-05-23 | Disposition: A | Discharge status: Home / Self Care | Payer: Medicare HMO | Source: Ambulatory Visit | Attending: Cardiology | Admitting: Cardiology

## 2016-05-23 ENCOUNTER — Encounter (HOSPITAL_COMMUNITY): Payer: Self-pay

## 2016-05-23 VITALS — BP 130/76 | HR 88 | Wt 159.2 lb

## 2016-05-23 DIAGNOSIS — I428 Other cardiomyopathies: Secondary | ICD-10-CM | POA: Diagnosis not present

## 2016-05-23 DIAGNOSIS — J449 Chronic obstructive pulmonary disease, unspecified: Secondary | ICD-10-CM | POA: Diagnosis not present

## 2016-05-23 DIAGNOSIS — I080 Rheumatic disorders of both mitral and aortic valves: Secondary | ICD-10-CM

## 2016-05-23 DIAGNOSIS — I350 Nonrheumatic aortic (valve) stenosis: Secondary | ICD-10-CM | POA: Diagnosis not present

## 2016-05-23 DIAGNOSIS — I340 Nonrheumatic mitral (valve) insufficiency: Secondary | ICD-10-CM | POA: Insufficient documentation

## 2016-05-23 DIAGNOSIS — I5042 Chronic combined systolic (congestive) and diastolic (congestive) heart failure: Secondary | ICD-10-CM | POA: Diagnosis present

## 2016-05-23 DIAGNOSIS — Z7901 Long term (current) use of anticoagulants: Secondary | ICD-10-CM | POA: Insufficient documentation

## 2016-05-23 DIAGNOSIS — I7789 Other specified disorders of arteries and arterioles: Secondary | ICD-10-CM | POA: Insufficient documentation

## 2016-05-23 DIAGNOSIS — Z87891 Personal history of nicotine dependence: Secondary | ICD-10-CM | POA: Diagnosis not present

## 2016-05-23 DIAGNOSIS — Z7984 Long term (current) use of oral hypoglycemic drugs: Secondary | ICD-10-CM | POA: Insufficient documentation

## 2016-05-23 DIAGNOSIS — I110 Hypertensive heart disease with heart failure: Secondary | ICD-10-CM | POA: Insufficient documentation

## 2016-05-23 DIAGNOSIS — Z79899 Other long term (current) drug therapy: Secondary | ICD-10-CM | POA: Insufficient documentation

## 2016-05-23 DIAGNOSIS — I272 Other secondary pulmonary hypertension: Secondary | ICD-10-CM | POA: Insufficient documentation

## 2016-05-23 DIAGNOSIS — E119 Type 2 diabetes mellitus without complications: Secondary | ICD-10-CM | POA: Diagnosis not present

## 2016-05-23 DIAGNOSIS — I482 Chronic atrial fibrillation, unspecified: Secondary | ICD-10-CM

## 2016-05-23 LAB — BASIC METABOLIC PANEL
ANION GAP: 8 (ref 5–15)
BUN: 27 mg/dL — ABNORMAL HIGH (ref 6–20)
CO2: 27 mmol/L (ref 22–32)
Calcium: 9.8 mg/dL (ref 8.9–10.3)
Chloride: 101 mmol/L (ref 101–111)
Creatinine, Ser: 1.19 mg/dL — ABNORMAL HIGH (ref 0.44–1.00)
GFR, EST AFRICAN AMERICAN: 49 mL/min — AB (ref >60)
GFR, EST NON AFRICAN AMERICAN: 42 mL/min — AB (ref >60)
GLUCOSE: 273 mg/dL — AB (ref 65–99)
POTASSIUM: 4.5 mmol/L (ref 3.5–5.1)
SODIUM: 136 mmol/L (ref 135–145)

## 2016-05-23 LAB — BRAIN NATRIURETIC PEPTIDE: B NATRIURETIC PEPTIDE 5: 250.7 pg/mL — AB (ref 0.0–100.0)

## 2016-05-23 MED ORDER — CARVEDILOL 25 MG PO TABS: 12.5000 mg | ORAL_TABLET | Freq: Two times a day (BID) | ORAL | 6 refills | Status: DC

## 2016-05-23 NOTE — Patient Instructions (Signed)
Increase Coreg to 25mg  twice daily.  Routine lab work today. Will notify you of abnormal results  Follow up with Dr.McLean in 6 weeks

## 2016-05-24 NOTE — Progress Notes (Signed)
Patient ID: BRALYNN EUGENE, female   DOB: Apr 13, 1935, 81 y.o.   MRN: [id number] PCP: Dr Raoul Pitch Cardiology: Dr. Johnsie Cancel HF Cardiology: Dr. Aundra Dubin  81 yo with chronic atrial fibrillation and chronic systolic CHF presents for CHF clinic followup.  She has been in atrial fibrillation long-term.  However, she had been more short of breath with exertion since 9/15.  This gradually worsened.  Echo in 3/15 actually showed normal EF.  Given increased dyspnea, echo was done in 3/16.  This showed EF 30-35%, newly decreased.  At an earlier appt, she was short of breath walking 50-100 feet, making up her bed, carrying any load, and vacuuming.    I thought she was volume overloaded and increased Lasix/started Entresto. She initially did much better with significantly decreased dyspnea.    I did a right and left heart cath in 2/17.  This showed no significant CAD and normal right and left heart filling pressures.  There was mild pulmonary arterial HTN.  Echo was done also, showing EF 40-45% (improved) with moderate to severe aortic stenosis.   I did a TEE in 3/17.  This showed EF 45% with possible low gradient at least moderate AS and possible anterior mitral leaflet chordal rupture with concern for very eccentric, severe mitral regurgitation.    She saw Dr. Roxy Manns for cardiac surgery evaluation.  Initial plan was repair of mitral valve and replacement of aortic valve.  However, she was found on CTA chest to have a porcelain aorta and was thought to be too high risk for open heart surgery.    She went down to [facility] and mitral valve clip was attempted.  This failed due to significant calcification of the valve and apparatus.   Her medications were changed because of low blood pressure post-procedure.   She is back on Entresto now.  I also increased her Lasix at last visit.  Weight is down 3 lbs.  She feels much better.  For the most part, dyspnea has resolved.  No orthopnea, no PND.  No chest pain.   No BRBPR or melena.     Labs (1/17): K 3.9, creatinine 0.63, BNP 198 Labs (2/17): K 4.4, creatinine 0.69 Labs (3/17): K 4.8, creatinine 0.78 Labs (5/17): K 4.1, creatinine 0.88, BNP 259 Labs (8/17): K 4.2, creatinine 1.2  PMH: 1. HTN 2. DM type II 3. Chronic atrial fibrillation 4. Right TKR 12/10.  5. Chronic systolic CHF:  - Remote cath showed no significant CAD> - Echo (3/15) with EF 60-65%.  - Echo (3/16) with EF 30-35%, mild LVH, mild aortic stenosis mean gradient 13 mmHg, mild MR, RV mildly dilated with normal systolic function, moderate TR.  - LHC/RHC (2/17): minimal luminal irregularities in coronaries.  Mean RA 4, PA 57/22, PCWP mean 15, PVR 2.7 WU, CI 3.29. Peak-to-peak aortic valve gradient 21 mmHg.  - Echo (2/17) with EF 40-45%, moderate LV hypertrophy, mild MR, normal RV size and systolic function, PASP 45 mmHg, moderate TR, moderate-severe aortic stenosis with mean gradient 30 mmHg, peak 78 mmHg, AVA 0.8 cm^2.  - TEE (3/17) with EF 45%, severely calcified aortic valve with mean gradient 16 mmHg but AVA 0.7 cm^2 (aortic gradient likely underestimated based on 2/17 TTE => ?low gradient severe AS versus pseudostenosis), possible anterior mitral leaflet chordal rupture with very eccentric, posteriorly-directed mitral regurgitation (Coanda effect).   6. Aortic stenosis: Low gradient at least moderate AS.   7. Mitral regurgitation: Appears severe by TEE with possible chordal rupture.  MV clip attempted at [facility], failed.  8. Porcelain aorta. 9. COPD: PFTs (4/17) with FVC 55%, FEV1 49%, radio 89%, DLCO 50% => moderate to severe obstructive lung disease.   FH: Atrial fibrillation.  No CAD.  SH: Retired, widow, lives in [place], quit smoking 1989, originally from [place], has 7 kids.   ROS: All systems reviewed and negative except as per HPI.   Current Outpatient Prescriptions  Medication Sig Dispense Refill  . atorvastatin (LIPITOR) 20 MG tablet Take 20 mg by mouth daily.    .  Calcium Carbonate-Vit D-Min 600-400 MG-UNIT TABS Take 1 tablet by mouth daily.     . carvedilol (COREG) 25 MG tablet Take 0.5 tablets (12.5 mg total) by mouth 2 (two) times daily. 60 tablet 6  . Cinnamon 500 MG capsule Take 500 mg by mouth daily.      . dorzolamide-timolol (COSOPT) 22.3-6.8 MG/ML ophthalmic solution Place 1 drop into both eyes 2 (two) times daily.     . furosemide (LASIX) 80 MG tablet Take 1 tablet (80 mg total) by mouth 2 (two) times daily. 60 tablet 6  . glipiZIDE (GLUCOTROL XL) 10 MG 24 hr tablet Take 1 tablet (10 mg total) by mouth daily. 90 tablet 1  . glucose blood (ACCU-CHEK AVIVA PLUS) test strip Use as instructed 100 each 11  . latanoprost (XALATAN) 0.005 % ophthalmic solution Place 1 drop into both eyes at bedtime.     . magnesium oxide (MAG-OX) 400 MG tablet Take 400 mg by mouth daily.    . metFORMIN (GLUCOPHAGE-XR) 500 MG 24 hr tablet Take 2 tablets (1,000 mg total) by mouth daily with breakfast. 60 tablet 5  . Multiple Vitamins-Minerals (MULTIVITAL) tablet Take 1 tablet by mouth daily.      . Omega-3 Fatty Acids (FISH OIL) 1000 MG CAPS Take 1 capsule by mouth daily.     . potassium chloride (KLOR-CON) 20 MEQ packet Take by mouth 2 (two) times daily.    . sacubitril-valsartan (ENTRESTO) 24-26 MG Take 1 tablet by mouth 2 (two) times daily. 60 tablet 3  . spironolactone (ALDACTONE) 25 MG tablet Take 1 tablet (25 mg total) by mouth daily. 90 tablet 3  . tiotropium (SPIRIVA HANDIHALER) 18 MCG inhalation capsule Place 1 capsule (18 mcg total) into inhaler and inhale daily. 30 capsule 3  . vitamin E 400 UNIT capsule Take 400 Units by mouth daily.      Marland Kitchen warfarin (COUMADIN) 5 MG tablet Take 1 tablet (5 mg total) by mouth daily. Takes 2.5mg  on Monday, and 5mg  all other days. (Patient taking differently: Take 5 mg by mouth daily. Takes 2.5mg  on Monday,wednesday and friday and 5mg  all other days.) 90 tablet 3   No current facility-administered medications for this encounter.     BP 130/76   Pulse 88   Wt 159 lb 4 oz (72.2 kg)   SpO2 97%   BMI 30.09 kg/m  General: NAD Neck: JVP 7 cm, no thyromegaly or thyroid nodule.  Lungs: Clear to auscultation bilaterally with normal respiratory effort. CV: Nondisplaced PMI.  Heart irregular S1/S2, no XX123456, 3/6 systolic crescendo-decrescendo murmur RUSB with S2 heard clearly, 3/6 HSM at apex.  Trace ankle edema.  No carotid bruit.  Normal pedal pulses.  Abdomen: Soft, nontender, no hepatosplenomegaly, no distention.  Skin: Intact without lesions or rashes.  Neurologic: Alert and oriented x 3.  Psych: Normal affect. Extremities: No clubbing or cyanosis.  HEENT: Normal.   Assessment/Plan: 1. Chronic systolic CHF: Echo (0000000)  with EF 30-35%, diffuse hypokinesis, improved to 45% on 3/17 TEE.  Nonischemic cardiomyopathy, no significant coronary disease on angiography.  This could be a valvular cardiomyopathy with both aortic valve and mitral valve disease.  NYHA class II symptoms.  Weight down and looks euvolemic.  - Continue Lasix 80 mg bid.  BMET/BNP today.    - Increase Coreg back to 25 mg bid.  - Continue Entresto 24/26 bid.   - Continue spironolactone.     2. Atrial fibrillation: Chronic.  She has been in atrial fibrillation for years, unlikely to be able to successfully cardiovert her. Continue warfarin. Increase Coreg back to 25 mg bid as above.   3. Aortic stenosis: On 2/17 echo, severe aortic stenosis by AVA, moderate by mean gradient.  By cath, suspect moderate AS given relative ease of crossing valve.  TEE again showed low gradient probably moderate aortic stenosis.  4. Pulmonary hypertension: Mild to moderate pulmonary hypertension on RHC but PVR not significantly elevated.  I think a sleep study would be reasonable given pulmonary hypertension but she does not want to do one.  5. Mitral regurgitation: Suspect rupture of an anterior leaflet chord with very eccentric mitral regurgitation.  MR is severe.  I had her  evaluated by surgery for MV repair and AoV replacement.  However, given porcelain aorta, she was deemed not a candidate for open heart surgery. I sent her for percutaneous MV clipping.  This was attempted by Dr Bridgette Habermann at [facility] but procedure failed due to the pattern of MV calcification.  Unfortunately, we do not have good options for treatment of her valvular disease at this point beyond medical management.   6. COPD: Moderate to severe by recent PFTs.  She is using Spiriva.   Followup 6 wks.   Loralie Champagne 05/24/2016

## 2016-06-27 ENCOUNTER — Other Ambulatory Visit (HOSPITAL_COMMUNITY): Payer: Self-pay | Admitting: *Deleted

## 2016-06-28 ENCOUNTER — Other Ambulatory Visit: Payer: Self-pay | Admitting: *Deleted

## 2016-06-28 ENCOUNTER — Encounter (HOSPITAL_COMMUNITY): Payer: Medicare HMO

## 2016-06-28 MED ORDER — ATORVASTATIN CALCIUM 20 MG PO TABS: 20.0000 mg | ORAL_TABLET | Freq: Every day | ORAL | 3 refills | Status: DC

## 2016-07-19 ENCOUNTER — Ambulatory Visit (HOSPITAL_COMMUNITY)
Admission: RE | Admit: 2016-07-19 | Discharge: 2016-07-19 | Disposition: A | Discharge status: Home / Self Care | Payer: Medicare HMO | Source: Ambulatory Visit | Attending: Cardiology | Admitting: Cardiology

## 2016-07-19 ENCOUNTER — Encounter (HOSPITAL_COMMUNITY): Payer: Self-pay

## 2016-07-19 VITALS — BP 134/82 | HR 78 | Wt 157.5 lb

## 2016-07-19 DIAGNOSIS — I482 Chronic atrial fibrillation, unspecified: Secondary | ICD-10-CM

## 2016-07-19 DIAGNOSIS — I429 Cardiomyopathy, unspecified: Secondary | ICD-10-CM | POA: Insufficient documentation

## 2016-07-19 DIAGNOSIS — I340 Nonrheumatic mitral (valve) insufficiency: Secondary | ICD-10-CM | POA: Diagnosis not present

## 2016-07-19 DIAGNOSIS — Z79899 Other long term (current) drug therapy: Secondary | ICD-10-CM | POA: Diagnosis not present

## 2016-07-19 DIAGNOSIS — E119 Type 2 diabetes mellitus without complications: Secondary | ICD-10-CM | POA: Diagnosis not present

## 2016-07-19 DIAGNOSIS — I5042 Chronic combined systolic (congestive) and diastolic (congestive) heart failure: Secondary | ICD-10-CM

## 2016-07-19 DIAGNOSIS — Z7901 Long term (current) use of anticoagulants: Secondary | ICD-10-CM | POA: Insufficient documentation

## 2016-07-19 DIAGNOSIS — I110 Hypertensive heart disease with heart failure: Secondary | ICD-10-CM | POA: Diagnosis present

## 2016-07-19 DIAGNOSIS — Z7984 Long term (current) use of oral hypoglycemic drugs: Secondary | ICD-10-CM | POA: Diagnosis not present

## 2016-07-19 DIAGNOSIS — I080 Rheumatic disorders of both mitral and aortic valves: Secondary | ICD-10-CM | POA: Diagnosis not present

## 2016-07-19 DIAGNOSIS — I480 Paroxysmal atrial fibrillation: Secondary | ICD-10-CM | POA: Insufficient documentation

## 2016-07-19 DIAGNOSIS — J449 Chronic obstructive pulmonary disease, unspecified: Secondary | ICD-10-CM | POA: Insufficient documentation

## 2016-07-19 DIAGNOSIS — I272 Pulmonary hypertension, unspecified: Secondary | ICD-10-CM | POA: Diagnosis not present

## 2016-07-19 DIAGNOSIS — Z87891 Personal history of nicotine dependence: Secondary | ICD-10-CM | POA: Insufficient documentation

## 2016-07-19 DIAGNOSIS — I5022 Chronic systolic (congestive) heart failure: Secondary | ICD-10-CM | POA: Insufficient documentation

## 2016-07-19 DIAGNOSIS — I350 Nonrheumatic aortic (valve) stenosis: Secondary | ICD-10-CM | POA: Insufficient documentation

## 2016-07-19 LAB — CBC
HEMATOCRIT: 36.2 % (ref 36.0–46.0)
HEMOGLOBIN: 11.8 g/dL — AB (ref 12.0–15.0)
MCH: 31.1 pg (ref 26.0–34.0)
MCHC: 32.6 g/dL (ref 30.0–36.0)
MCV: 95.3 fL (ref 78.0–100.0)
Platelets: 207 10*3/uL (ref 150–400)
RBC: 3.8 MIL/uL — AB (ref 3.87–5.11)
RDW: 14.4 % (ref 11.5–15.5)
WBC: 6.8 10*3/uL (ref 4.0–10.5)

## 2016-07-19 LAB — BASIC METABOLIC PANEL
ANION GAP: 8 (ref 5–15)
BUN: 26 mg/dL — ABNORMAL HIGH (ref 6–20)
CALCIUM: 10.2 mg/dL (ref 8.9–10.3)
CO2: 29 mmol/L (ref 22–32)
Chloride: 101 mmol/L (ref 101–111)
Creatinine, Ser: 1.22 mg/dL — ABNORMAL HIGH (ref 0.44–1.00)
GFR, EST AFRICAN AMERICAN: 48 mL/min — AB (ref >60)
GFR, EST NON AFRICAN AMERICAN: 41 mL/min — AB (ref >60)
GLUCOSE: 209 mg/dL — AB (ref 65–99)
POTASSIUM: 4.3 mmol/L (ref 3.5–5.1)
Sodium: 138 mmol/L (ref 135–145)

## 2016-07-19 NOTE — Patient Instructions (Signed)
Labs today We will only contact you if something comes back abnormal or we need to make some changes. Otherwise no news is good news!    Your physician recommends that you schedule a follow-up appointment in: 3 months with Dr McLean   Do the following things EVERYDAY: 1) Weigh yourself in the morning before breakfast. Write it down and keep it in a log. 2) Take your medicines as prescribed 3) Eat low salt foods-Limit salt (sodium) to 2000 mg per day.  4) Stay as active as you can everyday 5) Limit all fluids for the day to less than 2 liters   

## 2016-07-22 NOTE — Progress Notes (Signed)
Patient ID: Destiny [name], female   DOB: 13-Jul-1935, 81 y.o.   MRN: [id number] PCP: Dr Raoul Pitch Cardiology: Dr. Johnsie Cancel HF Cardiology: Dr. Aundra Dubin  81 yo with chronic atrial fibrillation and chronic systolic CHF presents for CHF clinic followup.  She has been in atrial fibrillation long-term.  However, she had been more short of breath with exertion since 9/15.  This gradually worsened.  Echo in 3/15 actually showed normal EF.  Given increased dyspnea, echo was done in 3/16.  This showed EF 30-35%, newly decreased.  At an earlier appt, she was short of breath walking 50-100 feet, making up her bed, carrying any load, and vacuuming.    I thought she was volume overloaded and increased Lasix/started Entresto. She initially did much better with significantly decreased dyspnea.    I did a right and left heart cath in 2/17.  This showed no significant CAD and normal right and left heart filling pressures.  There was mild pulmonary arterial HTN.  Echo was done also, showing EF 40-45% (improved) with moderate to severe aortic stenosis.   I did a TEE in 3/17.  This showed EF 45% with possible low gradient at least moderate AS and possible anterior mitral leaflet chordal rupture with concern for very eccentric, severe mitral regurgitation.    She saw Dr. Roxy Manns for cardiac surgery evaluation.  Initial plan was repair of mitral valve and replacement of aortic valve.  However, she was found on CTA chest to have a porcelain aorta and was thought to be too high risk for open heart surgery.    She went down to [facility] and mitral valve clip was attempted.  This failed due to significant calcification of the valve and apparatus.   Her medications were changed because of low blood pressure post-procedure.   She is doing well today.  Weight down another 2 lbs.  No dyspnea walking on flat ground.  No lightheadedness.  No orthopnea, no PND.  No chest pain.  No BRBPR or melena.     Labs (1/17): K 3.9,  creatinine 0.63, BNP 198 Labs (2/17): K 4.4, creatinine 0.69 Labs (3/17): K 4.8, creatinine 0.78 Labs (5/17): K 4.1, creatinine 0.88, BNP 259 Labs (8/17): K 4.2, creatinine 1.2 => 1.19, BNP 251  PMH: 1. HTN 2. DM type II 3. Chronic atrial fibrillation 4. Right TKR 12/10.  5. Chronic systolic CHF:  - Remote cath showed no significant CAD> - Echo (3/15) with EF 60-65%.  - Echo (3/16) with EF 30-35%, mild LVH, mild aortic stenosis mean gradient 13 mmHg, mild MR, RV mildly dilated with normal systolic function, moderate TR.  - LHC/RHC (2/17): minimal luminal irregularities in coronaries.  Mean RA 4, PA 57/22, PCWP mean 15, PVR 2.7 WU, CI 3.29. Peak-to-peak aortic valve gradient 21 mmHg.  - Echo (2/17) with EF 40-45%, moderate LV hypertrophy, mild MR, normal RV size and systolic function, PASP 45 mmHg, moderate TR, moderate-severe aortic stenosis with mean gradient 30 mmHg, peak 78 mmHg, AVA 0.8 cm^2.  - TEE (3/17) with EF 45%, severely calcified aortic valve with mean gradient 16 mmHg but AVA 0.7 cm^2 (aortic gradient likely underestimated based on 2/17 TTE => ?low gradient severe AS versus pseudostenosis), possible anterior mitral leaflet chordal rupture with very eccentric, posteriorly-directed mitral regurgitation (Coanda effect).   6. Aortic stenosis: Low gradient at least moderate AS.   7. Mitral regurgitation: Appears severe by TEE with possible chordal rupture.  [facility] clip attempted at [facility], failed.  8. Porcelain  aorta. 9. COPD: PFTs (4/17) with FVC 55%, FEV1 49%, radio 89%, DLCO 50% => moderate to severe obstructive lung disease.   FH: Atrial fibrillation.  No CAD.  SH: Retired, widow, lives in [place], quit smoking 1989, originally from [place], has 7 kids.   ROS: All systems reviewed and negative except as per HPI.   Current Outpatient Prescriptions  Medication Sig Dispense Refill  . atorvastatin (LIPITOR) 20 MG tablet Take 1 tablet (20 mg total) by mouth daily. 30 tablet 3  .  Calcium Carbonate-Vit D-Min 600-400 MG-UNIT TABS Take 1 tablet by mouth daily.     . carvedilol (COREG) 25 MG tablet Take 0.5 tablets (12.5 mg total) by mouth 2 (two) times daily. 60 tablet 6  . Cinnamon 500 MG capsule Take 500 mg by mouth daily.      . dorzolamide-timolol (COSOPT) 22.3-6.8 MG/ML ophthalmic solution Place 1 drop into both eyes 2 (two) times daily.     . furosemide (LASIX) 80 MG tablet Take 1 tablet (80 mg total) by mouth 2 (two) times daily. 60 tablet 6  . gabapentin (NEURONTIN) 300 MG capsule Take 300 mg by mouth 3 (three) times daily.    Marland Kitchen glipiZIDE (GLUCOTROL XL) 10 MG 24 hr tablet Take 1 tablet (10 mg total) by mouth daily. 90 tablet 1  . glucose blood (ACCU-CHEK AVIVA PLUS) test strip Use as instructed 100 each 11  . latanoprost (XALATAN) 0.005 % ophthalmic solution Place 1 drop into both eyes at bedtime.     . magnesium oxide (MAG-OX) 400 MG tablet Take 400 mg by mouth daily.    . metFORMIN (GLUCOPHAGE-XR) 500 MG 24 hr tablet Take 2 tablets (1,000 mg total) by mouth daily with breakfast. 60 tablet 5  . Multiple Vitamins-Minerals (MULTIVITAL) tablet Take 1 tablet by mouth daily.      . Omega-3 Fatty Acids (FISH OIL) 1000 MG CAPS Take 1 capsule by mouth daily.     . potassium chloride (KLOR-CON) 20 MEQ packet Take by mouth 2 (two) times daily.    . sacubitril-valsartan (ENTRESTO) 24-26 MG Take 1 tablet by mouth 2 (two) times daily. 60 tablet 3  . spironolactone (ALDACTONE) 25 MG tablet Take 1 tablet (25 mg total) by mouth daily. 90 tablet 3  . tiotropium (SPIRIVA HANDIHALER) 18 MCG inhalation capsule Place 1 capsule (18 mcg total) into inhaler and inhale daily. 30 capsule 3  . vitamin E 400 UNIT capsule Take 400 Units by mouth daily.      Marland Kitchen warfarin (COUMADIN) 5 MG tablet Take 1 tablet (5 mg total) by mouth daily. Takes 2.5mg  on Monday, and 5mg  all other days. (Patient taking differently: Take 5 mg by mouth daily. Takes 2.5mg  on Monday,wednesday and friday and 5mg  all other  days.) 90 tablet 3   No current facility-administered medications for this encounter.    BP 134/82   Pulse 78   Wt 157 lb 8 oz (71.4 kg)   SpO2 98%   BMI 29.76 kg/m  General: NAD Neck: JVP 7-8 cm, no thyromegaly or thyroid nodule.  Lungs: Clear to auscultation bilaterally with normal respiratory effort. CV: Nondisplaced PMI.  Heart irregular S1/S2, no [id number], 3/6 systolic crescendo-decrescendo murmur RUSB with S2 heard clearly, 3/6 HSM at apex.  Trace ankle edema.  No carotid bruit.  Normal pedal pulses.  Abdomen: Soft, nontender, no hepatosplenomegaly, no distention.  Skin: Intact without lesions or rashes.  Neurologic: Alert and oriented x 3.  Psych: Normal affect. Extremities: No clubbing or  cyanosis.  HEENT: Normal.   Assessment/Plan: 1. Chronic systolic CHF: Echo (0000000) with EF 30-35%, diffuse hypokinesis, improved to 45% on 3/17 TEE.  Nonischemic cardiomyopathy, no significant coronary disease on angiography.  This could be a valvular cardiomyopathy with both aortic valve and mitral valve disease.  NYHA class II symptoms.  Weight down and looks euvolemic.  - Continue Lasix 80 mg bid.  BMET today.    - Continue Coreg 25 mg bid.  - Continue Entresto 24/26 bid.   - Continue spironolactone.     2. Atrial fibrillation: Chronic.  She has been in atrial fibrillation for years, unlikely to be able to successfully cardiovert her. Continue warfarin and Coreg.   3. Aortic stenosis: On 2/17 echo, severe aortic stenosis by AVA, moderate by mean gradient.  By cath, suspect moderate AS given relative ease of crossing valve.  TEE showed low gradient probably moderate aortic stenosis.  4. Pulmonary hypertension: Mild to moderate pulmonary hypertension on RHC but PVR not significantly elevated.  I think a sleep study would be reasonable given pulmonary hypertension but she does not want to do one.  5. Mitral regurgitation: Suspect rupture of an anterior leaflet chord with very eccentric mitral  regurgitation.  MR is severe.  I had her evaluated by surgery for MV repair and AoV replacement.  However, given porcelain aorta, she was deemed not a candidate for open heart surgery. I sent her for percutaneous MV clipping.  This was attempted by Dr Bridgette Habermann at [facility] but procedure failed due to the pattern of MV calcification.  Unfortunately, we do not have good options for treatment of her valvular disease at this point beyond medical management.   6. COPD: Moderate to severe by recent PFTs.  She is using Spiriva.   Followup 3 mos.   Loralie Champagne 07/22/2016

## 2016-09-23 ENCOUNTER — Other Ambulatory Visit: Payer: Self-pay | Admitting: Family Medicine

## 2016-10-23 ENCOUNTER — Other Ambulatory Visit: Payer: Self-pay | Admitting: Cardiology

## 2016-11-02 ENCOUNTER — Ambulatory Visit (HOSPITAL_COMMUNITY)
Admission: RE | Admit: 2016-11-02 | Discharge: 2016-11-02 | Disposition: A | Discharge status: Home / Self Care | Payer: Medicare HMO | Source: Ambulatory Visit | Attending: Cardiology | Admitting: Cardiology

## 2016-11-02 ENCOUNTER — Encounter (HOSPITAL_COMMUNITY): Payer: Self-pay

## 2016-11-02 VITALS — BP 132/68 | HR 87 | Wt 160.8 lb

## 2016-11-02 DIAGNOSIS — Z7984 Long term (current) use of oral hypoglycemic drugs: Secondary | ICD-10-CM | POA: Insufficient documentation

## 2016-11-02 DIAGNOSIS — E119 Type 2 diabetes mellitus without complications: Secondary | ICD-10-CM | POA: Diagnosis not present

## 2016-11-02 DIAGNOSIS — I482 Chronic atrial fibrillation: Secondary | ICD-10-CM | POA: Diagnosis not present

## 2016-11-02 DIAGNOSIS — I5042 Chronic combined systolic (congestive) and diastolic (congestive) heart failure: Secondary | ICD-10-CM | POA: Diagnosis not present

## 2016-11-02 DIAGNOSIS — Z7901 Long term (current) use of anticoagulants: Secondary | ICD-10-CM | POA: Insufficient documentation

## 2016-11-02 DIAGNOSIS — I080 Rheumatic disorders of both mitral and aortic valves: Secondary | ICD-10-CM | POA: Diagnosis not present

## 2016-11-02 DIAGNOSIS — J449 Chronic obstructive pulmonary disease, unspecified: Secondary | ICD-10-CM | POA: Diagnosis not present

## 2016-11-02 DIAGNOSIS — I4891 Unspecified atrial fibrillation: Secondary | ICD-10-CM | POA: Diagnosis not present

## 2016-11-02 DIAGNOSIS — I350 Nonrheumatic aortic (valve) stenosis: Secondary | ICD-10-CM

## 2016-11-02 DIAGNOSIS — Z79899 Other long term (current) drug therapy: Secondary | ICD-10-CM | POA: Insufficient documentation

## 2016-11-02 DIAGNOSIS — I110 Hypertensive heart disease with heart failure: Secondary | ICD-10-CM | POA: Insufficient documentation

## 2016-11-02 DIAGNOSIS — Z87891 Personal history of nicotine dependence: Secondary | ICD-10-CM | POA: Insufficient documentation

## 2016-11-02 DIAGNOSIS — I5022 Chronic systolic (congestive) heart failure: Secondary | ICD-10-CM | POA: Insufficient documentation

## 2016-11-02 DIAGNOSIS — I272 Pulmonary hypertension, unspecified: Secondary | ICD-10-CM | POA: Insufficient documentation

## 2016-11-02 DIAGNOSIS — I429 Cardiomyopathy, unspecified: Secondary | ICD-10-CM | POA: Diagnosis not present

## 2016-11-02 LAB — BASIC METABOLIC PANEL
Anion gap: 3 — ABNORMAL LOW (ref 5–15)
BUN: 30 mg/dL — AB (ref 6–20)
CO2: 23 mmol/L (ref 22–32)
CREATININE: 1.37 mg/dL — AB (ref 0.44–1.00)
Calcium: 9.4 mg/dL (ref 8.9–10.3)
Chloride: 112 mmol/L — ABNORMAL HIGH (ref 101–111)
GFR, EST AFRICAN AMERICAN: 41 mL/min — AB (ref >60)
GFR, EST NON AFRICAN AMERICAN: 36 mL/min — AB (ref >60)
Glucose, Bld: 204 mg/dL — ABNORMAL HIGH (ref 65–99)
POTASSIUM: 5.9 mmol/L — AB (ref 3.5–5.1)
SODIUM: 138 mmol/L (ref 135–145)

## 2016-11-02 LAB — CBC
HCT: 27.4 % — ABNORMAL LOW (ref 36.0–46.0)
HEMOGLOBIN: 8.6 g/dL — AB (ref 12.0–15.0)
MCH: 31.6 pg (ref 26.0–34.0)
MCHC: 31.4 g/dL (ref 30.0–36.0)
MCV: 100.7 fL — ABNORMAL HIGH (ref 78.0–100.0)
Platelets: 168 10*3/uL (ref 150–400)
RBC: 2.72 MIL/uL — ABNORMAL LOW (ref 3.87–5.11)
RDW: 14.3 % (ref 11.5–15.5)
WBC: 6.3 10*3/uL (ref 4.0–10.5)

## 2016-11-02 LAB — BRAIN NATRIURETIC PEPTIDE: B NATRIURETIC PEPTIDE 5: 324.3 pg/mL — AB (ref 0.0–100.0)

## 2016-11-02 NOTE — Patient Instructions (Signed)
Increase Furosemide to 120 mg in AM and 80 mg in PM FOR 3 DAYS, then take 80 mg Twice daily   Labs today  Your physician has requested that you have an echocardiogram. Echocardiography is a painless test that uses sound waves to create images of your heart. It provides your doctor with information about the size and shape of your heart and how well your heart's chambers and valves are working. This procedure takes approximately one hour. There are no restrictions for this procedure.  We will contact you in 3 months to schedule your next appointment.

## 2016-11-03 NOTE — Progress Notes (Addendum)
Patient ID: Destiny [name], female   DOB: 1935-01-21, 82 y.o.   MRN: [id number] PCP: Dr Neta Mends Cardiology: Dr. Johnsie Cancel HF Cardiology: Dr. Aundra Dubin  82 yo with chronic atrial fibrillation and chronic systolic CHF presents for CHF clinic followup.  She has been in atrial fibrillation long-term.  However, she had been more short of breath with exertion since 9/15.  This gradually worsened.  Echo in 3/15 actually showed normal EF.  Given increased dyspnea, echo was done in 3/16.  This showed EF 30-35%, newly decreased.  At an earlier appt, she was short of breath walking 50-100 feet, making up her bed, carrying any load, and vacuuming.    I thought she was volume overloaded and increased Lasix/started Entresto. She initially did much better with significantly decreased dyspnea.    I did a right and left heart cath in 2/17.  This showed no significant CAD and normal right and left heart filling pressures.  There was mild pulmonary arterial HTN.  Echo was done also, showing EF 40-45% (improved) with moderate to severe aortic stenosis.   I did a TEE in 3/17.  This showed EF 45% with possible low gradient at least moderate AS and possible anterior mitral leaflet chordal rupture with concern for very eccentric, severe mitral regurgitation.    She saw Dr. Roxy Manns for cardiac surgery evaluation.  Initial plan was repair of mitral valve and replacement of aortic valve.  However, she was found on CTA chest to have a porcelain aorta and was thought to be too high risk for open heart surgery.    She went down to [facility] and mitral valve clip was attempted.  This failed due to significant calcification of the valve and apparatus.   Her medications were changed because of low blood pressure post-procedure.   She is stable today.  Weight up 3 lbs.  No dyspnea walking on flat ground.  No lightheadedness.  No orthopnea, no PND.  No chest pain.  No BRBPR or melena.   She is walking on her treadmill for 5  minutes twice a day.   Labs (1/17): K 3.9, creatinine 0.63, BNP 198 Labs (2/17): K 4.4, creatinine 0.69 Labs (3/17): K 4.8, creatinine 0.78 Labs (5/17): K 4.1, creatinine 0.88, BNP 259 Labs (8/17): K 4.2, creatinine 1.2 => 1.19, BNP 251 Labs (10/17): K 4.3, creatinine 1.22, hgb 11.8  PMH: 1. HTN 2. DM type II 3. Chronic atrial fibrillation 4. Right TKR 12/10.  5. Chronic systolic CHF:  - Remote cath showed no significant CAD> - Echo (3/15) with EF 60-65%.  - Echo (3/16) with EF 30-35%, mild LVH, mild aortic stenosis mean gradient 13 mmHg, mild MR, RV mildly dilated with normal systolic function, moderate TR.  - LHC/RHC (2/17): minimal luminal irregularities in coronaries.  Mean RA 4, PA 57/22, PCWP mean 15, PVR 2.7 WU, CI 3.29. Peak-to-peak aortic valve gradient 21 mmHg.  - Echo (2/17) with EF 40-45%, moderate LV hypertrophy, mild MR, normal RV size and systolic function, PASP 45 mmHg, moderate TR, moderate-severe aortic stenosis with mean gradient 30 mmHg, peak 78 mmHg, AVA 0.8 cm^2.  - TEE (3/17) with EF 45%, severely calcified aortic valve with mean gradient 16 mmHg but AVA 0.7 cm^2 (aortic gradient likely underestimated based on 2/17 TTE => ?low gradient severe AS versus pseudostenosis), possible anterior mitral leaflet chordal rupture with very eccentric, posteriorly-directed mitral regurgitation (Coanda effect).   6. Aortic stenosis: Low gradient at least moderate AS.   7. Mitral regurgitation:  Appears severe by TEE with possible chordal rupture.  MV clip attempted at [facility], failed.  8. Porcelain aorta. 9. COPD: PFTs (4/17) with FVC 55%, FEV1 49%, radio 89%, DLCO 50% => moderate to severe obstructive lung disease.   FH: Atrial fibrillation.  No CAD.  SH: Retired, widow, lives in [place], quit smoking 1989, originally from [place], has 7 kids.   ROS: All systems reviewed and negative except as per HPI.   Current Outpatient Prescriptions  Medication Sig Dispense Refill  .  atorvastatin (LIPITOR) 20 MG tablet TAKE ONE TABLET BY MOUTH ONCE DAILY 30 tablet 3  . Calcium Carbonate-Vit D-Min 600-400 MG-UNIT TABS Take 1 tablet by mouth daily.     . carvedilol (COREG) 25 MG tablet Take 0.5 tablets (12.5 mg total) by mouth 2 (two) times daily. 60 tablet 6  . Cinnamon 500 MG capsule Take 500 mg by mouth daily.      . dorzolamide-timolol (COSOPT) 22.3-6.8 MG/ML ophthalmic solution Place 1 drop into both eyes 2 (two) times daily.     . furosemide (LASIX) 80 MG tablet Take 1 tablet (80 mg total) by mouth 2 (two) times daily. 60 tablet 6  . gabapentin (NEURONTIN) 300 MG capsule Take 300 mg by mouth 3 (three) times daily.    Marland Kitchen glipiZIDE (GLUCOTROL XL) 10 MG 24 hr tablet Take 1 tablet (10 mg total) by mouth daily. 90 tablet 1  . glucose blood (ACCU-CHEK AVIVA PLUS) test strip Use as instructed 100 each 11  . latanoprost (XALATAN) 0.005 % ophthalmic solution Place 1 drop into both eyes at bedtime.     . magnesium oxide (MAG-OX) 400 MG tablet Take 400 mg by mouth daily.    . metFORMIN (GLUCOPHAGE-XR) 500 MG 24 hr tablet Take 2 tablets (1,000 mg total) by mouth daily with breakfast. 60 tablet 5  . Multiple Vitamins-Minerals (MULTIVITAL) tablet Take 1 tablet by mouth daily.      . Omega-3 Fatty Acids (FISH OIL) 1000 MG CAPS Take 1 capsule by mouth daily.     . potassium chloride (KLOR-CON) 20 MEQ packet Take by mouth 2 (two) times daily.    . sacubitril-valsartan (ENTRESTO) 24-26 MG Take 1 tablet by mouth 2 (two) times daily. 60 tablet 3  . spironolactone (ALDACTONE) 25 MG tablet Take 1 tablet (25 mg total) by mouth daily. 90 tablet 3  . tiotropium (SPIRIVA HANDIHALER) 18 MCG inhalation capsule Place 1 capsule (18 mcg total) into inhaler and inhale daily. 30 capsule 3  . vitamin E 400 UNIT capsule Take 400 Units by mouth daily.      Marland Kitchen warfarin (COUMADIN) 5 MG tablet Take 1 tablet (5 mg total) by mouth daily. Takes 2.5mg  on Monday, and 5mg  all other days. (Patient taking differently:  Take 5 mg by mouth daily. Takes 2.5mg  on Monday,wednesday and friday and 5mg  all other days.) 90 tablet 3   No current facility-administered medications for this encounter.    BP 132/68   Pulse 87   Wt 160 lb 12 oz (72.9 kg)   SpO2 99%   BMI 30.37 kg/m  General: NAD Neck: JVP 8-9 cm, no thyromegaly or thyroid nodule.  Lungs: Clear to auscultation bilaterally with normal respiratory effort. CV: Nondisplaced PMI.  Heart irregular S1/S2, no XX123456, 3/6 systolic crescendo-decrescendo murmur RUSB with S2 heard clearly, 3/6 HSM at apex.  No edema.  No carotid bruit.  Normal pedal pulses.  Abdomen: Soft, nontender, no hepatosplenomegaly, no distention.  Skin: Intact without lesions or rashes.  Neurologic: Alert and oriented x 3.  Psych: Normal affect. Extremities: No clubbing or cyanosis.  HEENT: Normal.   Assessment/Plan: 1. Chronic systolic CHF: Echo (0000000) with EF 30-35%, diffuse hypokinesis, improved to 45% on 3/17 TEE.  Nonischemic cardiomyopathy, no significant coronary disease on angiography.  This could be a valvular cardiomyopathy with both aortic valve and mitral valve disease.  NYHA class II symptoms.  Weight is up by a couple of pounds and she looks mildly volume overloaded on exam.  - Increase Lasix to 120 qam/80 qpm x 3 days, then decrease to 80 mg bid.  BMET/BNP today.     - Continue Coreg 25 mg bid.  - Continue Entresto 24/26 bid.   - Continue spironolactone.     2. Atrial fibrillation: Chronic.  She has been in atrial fibrillation for years, unlikely to be able to successfully cardiovert her. Continue warfarin and Coreg.  CBC today. 3. Aortic stenosis: On 2/17 echo, severe aortic stenosis by AVA, moderate by mean gradient.  By cath, suspect moderate AS given relative ease of crossing valve.  TEE showed low gradient probably moderate aortic stenosis.  - Repeat echo in 3/18.  4. Pulmonary hypertension: Mild to moderate pulmonary hypertension on RHC but PVR not significantly  elevated.  I think a sleep study would be reasonable given pulmonary hypertension but she does not want to do one.  5. Mitral regurgitation: Suspect rupture of an anterior leaflet chord with very eccentric mitral regurgitation.  MR is severe.  I had her evaluated by surgery for MV repair and AoV replacement.  However, given porcelain aorta, she was deemed not a candidate for open heart surgery. I sent her for percutaneous MV clipping.  This was attempted by Dr Bridgette Habermann at [facility] but procedure failed due to the pattern of MV calcification.  Unfortunately, we do not have good options for treatment of her valvular disease at this point beyond medical management.   6. COPD: Moderate to severe by recent PFTs.  She is using Spiriva.   Followup 3 mos.   Loralie Champagne 11/03/2016

## 2016-11-05 ENCOUNTER — Telehealth (HOSPITAL_COMMUNITY): Payer: Self-pay | Admitting: *Deleted

## 2016-11-05 ENCOUNTER — Other Ambulatory Visit (HOSPITAL_COMMUNITY): Payer: Self-pay | Admitting: Pharmacist

## 2016-11-05 ENCOUNTER — Telehealth (HOSPITAL_COMMUNITY): Payer: Self-pay | Admitting: Pharmacist

## 2016-11-05 DIAGNOSIS — I4891 Unspecified atrial fibrillation: Secondary | ICD-10-CM

## 2016-11-05 DIAGNOSIS — D649 Anemia, unspecified: Secondary | ICD-10-CM

## 2016-11-05 DIAGNOSIS — I5030 Unspecified diastolic (congestive) heart failure: Secondary | ICD-10-CM

## 2016-11-05 MED ORDER — SPIRONOLACTONE 25 MG PO TABS: 12.5000 mg | ORAL_TABLET | Freq: Every day | ORAL | 3 refills | Status: DC

## 2016-11-05 MED ORDER — SACUBITRIL-VALSARTAN 24-26 MG PO TABS: 1.0000 | ORAL_TABLET | Freq: Two times a day (BID) | ORAL | 11 refills | Status: DC

## 2016-11-05 NOTE — Telephone Encounter (Signed)
  GI Consult orders placed as well  Notes Recorded by Kennieth Rad, RN on 11/05/2016 at 8:56 AM EST Called and spoke with patient and she agrees with plan. She has already taken or morning medications today so she will hold meds starting tomorrow. Will restart Arlyce Harman on Friday at 12.5 mg Daily. I will update medications and place lab orders. Her daughter will call back to make a lab appointment. ------  Notes Recorded by Larey Dresser, MD on 11/03/2016 at 10:45 PM EST Stop KCl supplement. Hold spironolactone for 3 days and restart at 12.5 mg daily after that. Repeat K on Wednesday. Hemoglobin with significant drop. Ask her about any melena or blood in stool. She will need repeat CBC on Wednesday also. She will need GI appointment ASAP given concern for bleeding with warfarin use. Please call asap.

## 2016-11-05 NOTE — Telephone Encounter (Addendum)
Ms. Hartranft stated that her Delene Loll was $800 this month. I have called [facility] in [place] and verified that her copay is $47/mo and with the [employer] (still has $533 through 05/02/17) it is down to $0. Patient made aware.   [name]. Velva Harman, PharmD, BCPS, CPP Clinical Pharmacist Pager: [phone] Phone: [phone] 11/05/2016 11:29 AM

## 2016-11-05 NOTE — Telephone Encounter (Signed)
Notes Recorded by Kennieth Rad, RN on 11/05/2016 at 8:56 AM EST Called and spoke with patient and she agrees with plan. She has already taken or morning medications today so she will hold meds starting tomorrow. Will restart Arlyce Harman on Friday at 12.5 mg Daily. I will update medications and place lab orders. Her daughter will call back to make a lab appointment. ------  Notes Recorded by Larey Dresser, MD on 11/03/2016 at 10:45 PM EST Stop KCl supplement. Hold spironolactone for 3 days and restart at 12.5 mg daily after that. Repeat K on Wednesday. Hemoglobin with significant drop. Ask her about any melena or blood in stool. She will need repeat CBC on Wednesday also. She will need GI appointment ASAP given concern for bleeding with warfarin use. Please call asap.

## 2016-11-08 ENCOUNTER — Ambulatory Visit (HOSPITAL_COMMUNITY)
Admission: RE | Admit: 2016-11-08 | Discharge: 2016-11-08 | Disposition: A | Discharge status: Home / Self Care | Payer: Medicare HMO | Source: Ambulatory Visit | Attending: Internal Medicine | Admitting: Internal Medicine

## 2016-11-08 DIAGNOSIS — I5030 Unspecified diastolic (congestive) heart failure: Secondary | ICD-10-CM

## 2016-11-08 LAB — BASIC METABOLIC PANEL
Anion gap: 10 (ref 5–15)
BUN: 39 mg/dL — AB (ref 6–20)
CHLORIDE: 98 mmol/L — AB (ref 101–111)
CO2: 29 mmol/L (ref 22–32)
Calcium: 9.6 mg/dL (ref 8.9–10.3)
Creatinine, Ser: 1.91 mg/dL — ABNORMAL HIGH (ref 0.44–1.00)
GFR calc Af Amer: 28 mL/min — ABNORMAL LOW (ref >60)
GFR calc non Af Amer: 24 mL/min — ABNORMAL LOW (ref >60)
GLUCOSE: 172 mg/dL — AB (ref 65–99)
POTASSIUM: 4.6 mmol/L (ref 3.5–5.1)
Sodium: 137 mmol/L (ref 135–145)

## 2016-11-08 LAB — CBC
HEMATOCRIT: 27.9 % — AB (ref 36.0–46.0)
Hemoglobin: 8.9 g/dL — ABNORMAL LOW (ref 12.0–15.0)
MCH: 31.4 pg (ref 26.0–34.0)
MCHC: 31.9 g/dL (ref 30.0–36.0)
MCV: 98.6 fL (ref 78.0–100.0)
Platelets: 184 10*3/uL (ref 150–400)
RBC: 2.83 MIL/uL — ABNORMAL LOW (ref 3.87–5.11)
RDW: 13.7 % (ref 11.5–15.5)
WBC: 5.9 10*3/uL (ref 4.0–10.5)

## 2016-11-09 ENCOUNTER — Encounter: Payer: Self-pay | Admitting: Gastroenterology

## 2016-11-09 NOTE — Addendum Note (Signed)
Addended by: Scarlette Calico on: 11/09/2016 03:35 PM   Modules accepted: Orders

## 2016-11-19 ENCOUNTER — Other Ambulatory Visit (HOSPITAL_COMMUNITY): Payer: Medicare HMO

## 2016-11-20 ENCOUNTER — Ambulatory Visit: Payer: Medicare HMO | Admitting: Gastroenterology

## 2016-11-23 ENCOUNTER — Encounter: Payer: Self-pay | Admitting: Gastroenterology

## 2016-11-23 ENCOUNTER — Other Ambulatory Visit (INDEPENDENT_AMBULATORY_CARE_PROVIDER_SITE_OTHER): Payer: Medicare HMO

## 2016-11-23 ENCOUNTER — Ambulatory Visit (INDEPENDENT_AMBULATORY_CARE_PROVIDER_SITE_OTHER): Payer: Medicare HMO | Admitting: Gastroenterology

## 2016-11-23 ENCOUNTER — Telehealth: Payer: Self-pay | Admitting: *Deleted

## 2016-11-23 VITALS — BP 104/68 | HR 72 | Ht 61.0 in | Wt 161.0 lb

## 2016-11-23 DIAGNOSIS — Z7901 Long term (current) use of anticoagulants: Secondary | ICD-10-CM

## 2016-11-23 DIAGNOSIS — D649 Anemia, unspecified: Secondary | ICD-10-CM

## 2016-11-23 LAB — IBC PANEL
Iron: 17 ug/dL — ABNORMAL LOW (ref 42–145)
Saturation Ratios: 3.6 % — ABNORMAL LOW (ref 20.0–50.0)
TRANSFERRIN: 335 mg/dL (ref 212.0–360.0)

## 2016-11-23 LAB — CBC WITH DIFFERENTIAL/PLATELET
BASOS ABS: 0 10*3/uL (ref 0.0–0.1)
Basophils Relative: 0.7 % (ref 0.0–3.0)
Eosinophils Absolute: 0.2 10*3/uL (ref 0.0–0.7)
Eosinophils Relative: 3.7 % (ref 0.0–5.0)
LYMPHS PCT: 22.7 % (ref 12.0–46.0)
Lymphs Abs: 1.4 10*3/uL (ref 0.7–4.0)
MCHC: 33 g/dL (ref 30.0–36.0)
MCV: 95 fl (ref 78.0–100.0)
Monocytes Absolute: 0.7 10*3/uL (ref 0.1–1.0)
Monocytes Relative: 11.3 % (ref 3.0–12.0)
Neutro Abs: 3.9 10*3/uL (ref 1.4–7.7)
Neutrophils Relative %: 61.6 % (ref 43.0–77.0)
PLATELETS: 194 10*3/uL (ref 150.0–400.0)
RBC: 2.66 Mil/uL — ABNORMAL LOW (ref 3.87–5.11)
RDW: 13.6 % (ref 11.5–15.5)
WBC: 6.3 10*3/uL (ref 4.0–10.5)

## 2016-11-23 LAB — BASIC METABOLIC PANEL
BUN: 33 mg/dL — AB (ref 6–23)
CHLORIDE: 98 meq/L (ref 96–112)
CO2: 33 mEq/L — ABNORMAL HIGH (ref 19–32)
Calcium: 9.5 mg/dL (ref 8.4–10.5)
Creatinine, Ser: 1.55 mg/dL — ABNORMAL HIGH (ref 0.40–1.20)
GFR: 34.2 mL/min — ABNORMAL LOW (ref >60.00)
GLUCOSE: 156 mg/dL — AB (ref 70–99)
POTASSIUM: 4.2 meq/L (ref 3.5–5.1)
SODIUM: 139 meq/L (ref 135–145)

## 2016-11-23 LAB — IGA: IGA: 230 mg/dL (ref 68–378)

## 2016-11-23 LAB — FERRITIN: Ferritin: 18.8 ng/mL (ref 10.0–291.0)

## 2016-11-23 LAB — VITAMIN B12: Vitamin B-12: 590 pg/mL (ref 211–911)

## 2016-11-23 LAB — FOLATE

## 2016-11-23 MED ORDER — NA SULFATE-K SULFATE-MG SULF 17.5-3.13-1.6 GM/177ML PO SOLN: 1.0000 | Freq: Once | ORAL | 0 refills | Status: AC

## 2016-11-23 NOTE — Telephone Encounter (Signed)
  11/23/2016   RE: Destiny [name] DOB: Feb 23, 1935 MRN: [id number]   Dear  Dr Aundra Dubin    We have scheduled the above patient for an endoscopic procedure. Our records show that she is on anticoagulation therapy.   Please advise as to how long the patient may come off her therapy of Coumadin prior to the procedure Colon /Endoscopy, which is scheduled for 01/24/2017.  Please fax back/ or route the completed form to [facility] at [phone].   Sincerely,    Tonita Phoenix AAMA

## 2016-11-23 NOTE — Telephone Encounter (Signed)
OK to hold coumadin 3-5 days prior to procedure.

## 2016-11-23 NOTE — Patient Instructions (Addendum)
Go to the basement for labs today  You have been scheduled for an endoscopy and colonoscopy. Please follow the written instructions given to you at your visit today. Please pick up your prep supplies at the pharmacy within the next 1-3 days. If you use inhalers (even only as needed), please bring them with you on the day of your procedure. Your physician has requested that you go to www.startemmi.com and enter the access code given to you at your visit today. This web site gives a general overview about your procedure. However, you should still follow specific instructions given to you by our office regarding your preparation for the procedure.  You will be contaced by our office prior to your procedure for directions on holding your Coumadin/Warfarin.  If you do not hear from our office 1 week prior to your scheduled procedure, please call [phone] to discuss.

## 2016-11-23 NOTE — Progress Notes (Signed)
11/23/2016 Destiny [name] [id number] 30-Apr-1935   HISTORY OF PRESENT ILLNESS:  This is a 82 year old female who is new to our practice. She was referred here by Dr. Aundra Dubin, cardiology, for evaluation of anemia. She has past medical history of hypertension, hyperlipidemia, chronic systolic CHF with most recent ejection fraction of 45%, chronic atrial fibrillation on anticoagulation with Coumadin, aortic stenosis, COPD, and chronic kidney disease.  She's never seen a GI physician in the past has never had colonoscopy or endoscopy. It appears that she has some chronic low-grade anemia dating back at least past couple of years. Her hemoglobin seems to hang between 11 and 12 g. Most recent hemoglobin 3 weeks ago was 8.6 g. She denies seeing any blood in her stool or dark black stools. Nobody has checked stool for occult blood. There are no recent iron studies.  She denies any GI complaints.  Past Medical History:  Diagnosis Date  . Aortic calcification (HCC) 02/20/2016   Involving entire thoracic and abdominal aorta  . Atrial fibrillation ([place]) 1999-diagnosed  . Calculus of kidney   . Cataract    left eye (removed)  . Complication of anesthesia    slow to awaken  . Diabetes mellitus without mention of complication   . Diastolic CHF, chronic ([place]) 12/30/2014  . Edema   . Incidental pulmonary nodule    Results of CT scan:  Lungs/Pleura: There is some progressive scarring and reticulonodular opacity in the left upper lobe and lingula extending to the anterior pleural surface. There is some associated mild traction bronchiectasis in the lingula. Findings are suggestive of postinflammatory/postinfectious changes. Recommend correlation with any active infectious symptoms. CT follow-up would be appro  . Mitral valve insufficiency and aortic valve insufficiency   . Mononeuritis of unspecified site   . Osteoarthrosis, unspecified whether generalized or localized, unspecified site   . Pure  hypercholesterolemia   . Unspecified essential hypertension   . Unspecified glaucoma(365.9)    Past Surgical History:  Procedure Laterality Date  . ABDOMINAL HYSTERECTOMY  2000   partial  . APPENDECTOMY  1954  . CARDIAC CATHETERIZATION N/A 11/16/2015   Procedure: Right/Left Heart Cath and Coronary Angiography;  Surgeon: Larey Dresser, MD;  Location: [place] [facility];  Service: Cardiovascular;  Laterality: N/A;  . catarct surgery Left   . CYSTOSCOPY W/ RETROGRADES Right 11/26/2012   Procedure: CYSTOSCOPY WITH RETROGRADE PYELOGRAM;  Surgeon: Alexis Frock, MD;  Location: [facility];  Service: Urology;  Laterality: Right;  . HOLMIUM LASER APPLICATION Right 5/68/1275   Procedure: HOLMIUM LASER APPLICATION;  Surgeon: Alexis Frock, MD;  Location: [facility];  Service: Urology;  Laterality: Right;  . JOINT REPLACEMENT Right 2009   knee  . kidney stone     ? Dr Tammi Klippel  . TEE WITHOUT CARDIOVERSION N/A 12/08/2015   Procedure: TRANSESOPHAGEAL ECHOCARDIOGRAM (TEE);  Surgeon: Larey Dresser, MD;  Location: [place];  Service: Cardiovascular;  Laterality: N/A;  . TONSILLECTOMY  1943  . TRANSESOPHAGEAL ECHOCARDIOGRAM  02/2012    reports that she quit smoking about 28 years ago. She has never used smokeless tobacco. She reports that she drinks alcohol. She reports that she does not use drugs. family history includes Cancer (age of onset: 42) in her mother. No Known Allergies    Outpatient Encounter Prescriptions as of 11/23/2016  Medication Sig  . atorvastatin (LIPITOR) 20 MG tablet TAKE ONE TABLET BY MOUTH ONCE DAILY  . Calcium Carbonate-Vit D-Min 600-400 MG-UNIT TABS Take 1  tablet by mouth daily.   . carvedilol (COREG) 25 MG tablet Take 0.5 tablets (12.5 mg total) by mouth 2 (two) times daily.  . Cinnamon 500 MG capsule Take 500 mg by mouth daily.    . dorzolamide-timolol (COSOPT) 22.3-6.8 MG/ML ophthalmic solution Place 1 drop into both eyes 2 (two) times  daily.   . furosemide (LASIX) 80 MG tablet Take 1 tablet (80 mg total) by mouth 2 (two) times daily.  Marland Kitchen gabapentin (NEURONTIN) 300 MG capsule Take 300 mg by mouth 3 (three) times daily.  Marland Kitchen glipiZIDE (GLUCOTROL XL) 10 MG 24 hr tablet Take 1 tablet (10 mg total) by mouth daily.  Marland Kitchen glucose blood (ACCU-CHEK AVIVA PLUS) test strip Use as instructed  . latanoprost (XALATAN) 0.005 % ophthalmic solution Place 1 drop into both eyes at bedtime.   . magnesium oxide (MAG-OX) 400 MG tablet Take 400 mg by mouth daily.  . metFORMIN (GLUCOPHAGE-XR) 500 MG 24 hr tablet Take 2 tablets (1,000 mg total) by mouth daily with breakfast.  . Multiple Vitamins-Minerals (MULTIVITAL) tablet Take 1 tablet by mouth daily.    . Omega-3 Fatty Acids (FISH OIL) 1000 MG CAPS Take 1 capsule by mouth daily.   . sacubitril-valsartan (ENTRESTO) 24-26 MG Take 1 tablet by mouth 2 (two) times daily.  Marland Kitchen spironolactone (ALDACTONE) 25 MG tablet Take 0.5 tablets (12.5 mg total) by mouth daily.  Marland Kitchen tiotropium (SPIRIVA HANDIHALER) 18 MCG inhalation capsule Place 1 capsule (18 mcg total) into inhaler and inhale daily.  . vitamin E 400 UNIT capsule Take 400 Units by mouth daily.    Marland Kitchen warfarin (COUMADIN) 5 MG tablet Take 1 tablet (5 mg total) by mouth daily. Takes 2.'5mg'$  on Monday, and '5mg'$  all other days. (Patient taking differently: Take 5 mg by mouth daily. Takes 2.'5mg'$  on Monday,wednesday and friday and '5mg'$  all other days.)  . Na Sulfate-K Sulfate-Mg Sulf (SUPREP BOWEL PREP KIT) 17.5-3.13-1.6 GM/180ML SOLN Take 1 kit by mouth once.   No facility-administered encounter medications on file as of 11/23/2016.      REVIEW OF SYSTEMS  : All other systems reviewed and negative except where noted in the History of Present Illness.   PHYSICAL EXAM: BP 104/68   Pulse 72   Ht '5\' 1"'$  (1.549 m)   Wt 161 lb (73 kg)   BMI 30.42 kg/m  General: Well developed white female in no acute distress Head: Normocephalic and atraumatic Eyes:  Sclerae anicteric,  conjunctiva pink. Ears: Normal auditory acuity Lungs: Clear throughout to auscultation Heart: Regular rate and rhythm Abdomen: Soft, non-distended. Normal bowel sounds.  Non-tender. Rectal:  Will be done at the time of colonoscopy. Musculoskeletal: Symmetrical with no gross deformities  Skin: No lesions on visible extremities Extremities: No edema  Neurological: Alert oriented x 4, grossly non-focal Psychological:  Alert and cooperative. Normal mood and affect  ASSESSMENT AND PLAN: -Anemia:  This is actually normocytic but Hgb down 2-3 grams from her baseline a few months ago.  No overt bleeding and nobody has done stool for occult blood.  Has never undergone GI evaluation in the past.  Will schedule for EGD and colonoscopy with Dr. Henrene Pastor.  Will recheck CBC today along with iron studies.  Anemia likely at least in part due to CKD/anemia of chronic disease. -Chronic anticoagulation on coumadin for atrial fibrillation:  Will hold coumadin for 5 days prior to endoscopic procedures - will instruct when and how to resume after procedure. Benefits and risks of procedure explained including risks of bleeding,  perforation, infection, missed lesions, reactions to medications and possible need for hospitalization and surgery for complications. Additional rare but real risk of stroke or other vascular clotting events off coumadin also explained and need to seek urgent help if any signs of these problems occur. Will communicate by phone or EMR with patient's prescribing provider, Dr. Aundra Dubin, to confirm that holding coumadin is reasonable in this case.   CC:  Corrington, Kip A, MD

## 2016-11-26 LAB — TISSUE TRANSGLUTAMINASE, IGA: TISSUE TRANSGLUTAMINASE AB, IGA: 1 U/mL (ref <4)

## 2016-11-26 NOTE — Progress Notes (Signed)
Assessment and plan as noted. Patient for colonoscopy and upper endoscopy to evaluate worsening anemia in a patient on chronic anticoagulation. She is high risk for endoscopic procedures as outlined

## 2016-11-26 NOTE — Telephone Encounter (Signed)
Yes, hold for 5 days.  Thank you,  Jess

## 2016-11-26 NOTE — Telephone Encounter (Signed)
Jess, I got the anticoag back on this patient  Ok to hold 3-5 days...Marland KitchenMarland Kitchen Dr Aundra Dubin did not specify.  Should it be held the 5 days?

## 2016-11-27 ENCOUNTER — Telehealth: Payer: Self-pay | Admitting: *Deleted

## 2016-11-27 NOTE — Telephone Encounter (Signed)
At patients request cancelled colon/endo informed jess

## 2016-11-27 NOTE — Telephone Encounter (Signed)
Destiny [name], I called pt to inform to hold her coumadin and she wanted me to cancel the procedures, I asked her if I could help her in any way or answer any questions about her procedures but she said she absolutely does not want these procedures and thought she made that clear from the beginning   Just a FYI

## 2016-11-28 NOTE — Telephone Encounter (Signed)
That is her choice, but she was clearly in agreement when you scheduled them or else she would not have even scheduled at all.

## 2016-11-29 ENCOUNTER — Telehealth (HOSPITAL_COMMUNITY): Payer: Self-pay | Admitting: *Deleted

## 2016-11-29 NOTE — Telephone Encounter (Signed)
May take Lasix 120 qam/80 qpm x 3 days then back to 80 mg bid.

## 2016-11-29 NOTE — Telephone Encounter (Signed)
Pt called to report she has gained 3 lbs over past couple of days.  She denies swelling but does c/o increased SOB.  She states she has been taking meds including Lasix 80 mg BID, denies any extra salt or fluid intake.  Will send to Dr Aundra Dubin for further review.

## 2016-11-30 NOTE — Telephone Encounter (Signed)
Spoke w/pt, she is aware, agreeable and verbalizes understanding. 

## 2016-12-17 ENCOUNTER — Other Ambulatory Visit (HOSPITAL_COMMUNITY): Payer: Self-pay | Admitting: Cardiology

## 2016-12-17 MED ORDER — ATORVASTATIN CALCIUM 20 MG PO TABS: 20.0000 mg | ORAL_TABLET | Freq: Every day | ORAL | 3 refills | Status: DC

## 2016-12-24 ENCOUNTER — Other Ambulatory Visit: Payer: Self-pay | Admitting: Family Medicine

## 2016-12-27 ENCOUNTER — Ambulatory Visit (HOSPITAL_COMMUNITY)
Admission: RE | Admit: 2016-12-27 | Discharge: 2016-12-27 | Disposition: A | Discharge status: Home / Self Care | Payer: Medicare HMO | Source: Ambulatory Visit | Attending: Family Medicine | Admitting: Family Medicine

## 2016-12-27 DIAGNOSIS — I080 Rheumatic disorders of both mitral and aortic valves: Secondary | ICD-10-CM | POA: Diagnosis not present

## 2016-12-27 DIAGNOSIS — Q211 Atrial septal defect: Secondary | ICD-10-CM | POA: Insufficient documentation

## 2016-12-27 DIAGNOSIS — I083 Combined rheumatic disorders of mitral, aortic and tricuspid valves: Secondary | ICD-10-CM | POA: Insufficient documentation

## 2016-12-27 DIAGNOSIS — I350 Nonrheumatic aortic (valve) stenosis: Secondary | ICD-10-CM

## 2016-12-27 NOTE — Progress Notes (Signed)
  Echocardiogram 2D Echocardiogram has been performed.  Bobbye Charleston 12/27/2016, 4:01 PM

## 2016-12-31 ENCOUNTER — Telehealth (HOSPITAL_COMMUNITY): Payer: Self-pay | Admitting: *Deleted

## 2016-12-31 NOTE — Telephone Encounter (Signed)
Pt aware, agreeable and verbalizes understanding.  Pt will have her friend who bring her to appts call us back to make a f/u appt

## 2016-12-31 NOTE — Telephone Encounter (Signed)
Pt called to report her wt is up 3 lbs overnight and she is havine more SOB.  She states she has been taking Lasix 80 mg BID.  Will send to Dr Aundra Dubin for review and call her back with his recommendations

## 2016-12-31 NOTE — Telephone Encounter (Signed)
Take 120 mg bid Lasix x 3 days then back to 80 bid and arrange a followup.

## 2017-01-02 ENCOUNTER — Inpatient Hospital Stay (HOSPITAL_COMMUNITY)
Admission: EM | Admit: 2017-01-02 | Discharge: 2017-01-03 | DRG: 378 | Disposition: A | Discharge status: Home / Self Care | Payer: Medicare Other | Attending: Internal Medicine | Admitting: Internal Medicine

## 2017-01-02 ENCOUNTER — Encounter (HOSPITAL_COMMUNITY): Payer: Self-pay | Admitting: *Deleted

## 2017-01-02 DIAGNOSIS — I080 Rheumatic disorders of both mitral and aortic valves: Secondary | ICD-10-CM | POA: Diagnosis not present

## 2017-01-02 DIAGNOSIS — N179 Acute kidney failure, unspecified: Secondary | ICD-10-CM | POA: Diagnosis present

## 2017-01-02 DIAGNOSIS — E1141 Type 2 diabetes mellitus with diabetic mononeuropathy: Secondary | ICD-10-CM | POA: Diagnosis present

## 2017-01-02 DIAGNOSIS — I272 Pulmonary hypertension, unspecified: Secondary | ICD-10-CM | POA: Diagnosis present

## 2017-01-02 DIAGNOSIS — I429 Cardiomyopathy, unspecified: Secondary | ICD-10-CM | POA: Diagnosis not present

## 2017-01-02 DIAGNOSIS — E119 Type 2 diabetes mellitus without complications: Secondary | ICD-10-CM

## 2017-01-02 DIAGNOSIS — Z66 Do not resuscitate: Secondary | ICD-10-CM | POA: Diagnosis present

## 2017-01-02 DIAGNOSIS — I482 Chronic atrial fibrillation: Secondary | ICD-10-CM | POA: Diagnosis not present

## 2017-01-02 DIAGNOSIS — K922 Gastrointestinal hemorrhage, unspecified: Secondary | ICD-10-CM | POA: Diagnosis present

## 2017-01-02 DIAGNOSIS — J449 Chronic obstructive pulmonary disease, unspecified: Secondary | ICD-10-CM | POA: Diagnosis not present

## 2017-01-02 DIAGNOSIS — I700 Atherosclerosis of aorta: Secondary | ICD-10-CM | POA: Diagnosis not present

## 2017-01-02 DIAGNOSIS — Z79899 Other long term (current) drug therapy: Secondary | ICD-10-CM

## 2017-01-02 DIAGNOSIS — Z7901 Long term (current) use of anticoagulants: Secondary | ICD-10-CM

## 2017-01-02 DIAGNOSIS — K921 Melena: Secondary | ICD-10-CM

## 2017-01-02 DIAGNOSIS — I119 Hypertensive heart disease without heart failure: Secondary | ICD-10-CM | POA: Diagnosis not present

## 2017-01-02 DIAGNOSIS — Z96651 Presence of right artificial knee joint: Secondary | ICD-10-CM | POA: Diagnosis present

## 2017-01-02 DIAGNOSIS — Z972 Presence of dental prosthetic device (complete) (partial): Secondary | ICD-10-CM

## 2017-01-02 DIAGNOSIS — D62 Acute posthemorrhagic anemia: Secondary | ICD-10-CM

## 2017-01-02 DIAGNOSIS — E7800 Pure hypercholesterolemia, unspecified: Secondary | ICD-10-CM | POA: Diagnosis not present

## 2017-01-02 DIAGNOSIS — I5042 Chronic combined systolic (congestive) and diastolic (congestive) heart failure: Secondary | ICD-10-CM | POA: Diagnosis not present

## 2017-01-02 DIAGNOSIS — Z7984 Long term (current) use of oral hypoglycemic drugs: Secondary | ICD-10-CM

## 2017-01-02 DIAGNOSIS — K449 Diaphragmatic hernia without obstruction or gangrene: Secondary | ICD-10-CM | POA: Diagnosis present

## 2017-01-02 DIAGNOSIS — H409 Unspecified glaucoma: Secondary | ICD-10-CM | POA: Diagnosis present

## 2017-01-02 DIAGNOSIS — K254 Chronic or unspecified gastric ulcer with hemorrhage: Secondary | ICD-10-CM | POA: Diagnosis not present

## 2017-01-02 DIAGNOSIS — Z87891 Personal history of nicotine dependence: Secondary | ICD-10-CM

## 2017-01-02 DIAGNOSIS — I959 Hypotension, unspecified: Secondary | ICD-10-CM | POA: Diagnosis present

## 2017-01-02 DIAGNOSIS — I4891 Unspecified atrial fibrillation: Secondary | ICD-10-CM

## 2017-01-02 DIAGNOSIS — G8929 Other chronic pain: Secondary | ICD-10-CM | POA: Diagnosis not present

## 2017-01-02 DIAGNOSIS — Z794 Long term (current) use of insulin: Secondary | ICD-10-CM

## 2017-01-02 DIAGNOSIS — Z90710 Acquired absence of both cervix and uterus: Secondary | ICD-10-CM

## 2017-01-02 DIAGNOSIS — I10 Essential (primary) hypertension: Secondary | ICD-10-CM | POA: Diagnosis present

## 2017-01-02 LAB — COMPREHENSIVE METABOLIC PANEL
ALK PHOS: 35 U/L — AB (ref 38–126)
ALT: 11 U/L — AB (ref 14–54)
ANION GAP: 11 (ref 5–15)
AST: 21 U/L (ref 15–41)
Albumin: 3.5 g/dL (ref 3.5–5.0)
BUN: 48 mg/dL — ABNORMAL HIGH (ref 6–20)
CALCIUM: 9.3 mg/dL (ref 8.9–10.3)
CO2: 27 mmol/L (ref 22–32)
CREATININE: 1.92 mg/dL — AB (ref 0.44–1.00)
Chloride: 101 mmol/L (ref 101–111)
GFR, EST AFRICAN AMERICAN: 27 mL/min — AB (ref >60)
GFR, EST NON AFRICAN AMERICAN: 24 mL/min — AB (ref >60)
Glucose, Bld: 213 mg/dL — ABNORMAL HIGH (ref 65–99)
Potassium: 3.9 mmol/L (ref 3.5–5.1)
Sodium: 139 mmol/L (ref 135–145)
Total Bilirubin: 0.5 mg/dL (ref 0.3–1.2)
Total Protein: 6.3 g/dL — ABNORMAL LOW (ref 6.5–8.1)

## 2017-01-02 LAB — PROTIME-INR
INR: 2.08
Prothrombin Time: 23.7 seconds — ABNORMAL HIGH (ref 11.4–15.2)

## 2017-01-02 LAB — ABO/RH: ABO/RH(D): A POS

## 2017-01-02 LAB — CBC
HCT: 21.4 % — ABNORMAL LOW (ref 36.0–46.0)
Hemoglobin: 6.5 g/dL — CL (ref 12.0–15.0)
MCH: 27.1 pg (ref 26.0–34.0)
MCHC: 30.4 g/dL (ref 30.0–36.0)
MCV: 89.2 fL (ref 78.0–100.0)
PLATELETS: 236 10*3/uL (ref 150–400)
RBC: 2.4 MIL/uL — ABNORMAL LOW (ref 3.87–5.11)
RDW: 15.6 % — ABNORMAL HIGH (ref 11.5–15.5)
WBC: 6.7 10*3/uL (ref 4.0–10.5)

## 2017-01-02 LAB — HEMOGLOBIN AND HEMATOCRIT, BLOOD
HEMATOCRIT: 24.1 % — AB (ref 36.0–46.0)
HEMOGLOBIN: 7.4 g/dL — AB (ref 12.0–15.0)

## 2017-01-02 LAB — PREPARE RBC (CROSSMATCH)

## 2017-01-02 LAB — POC OCCULT BLOOD, ED: FECAL OCCULT BLD: POSITIVE — AB

## 2017-01-02 MED ORDER — ATORVASTATIN CALCIUM 20 MG PO TABS
20.0000 mg | ORAL_TABLET | Freq: Every day | ORAL | Status: DC
  Administered 2017-01-02: 20 mg via ORAL
  Filled 2017-01-02: qty 1

## 2017-01-02 MED ORDER — SODIUM CHLORIDE 0.9% FLUSH: 3.0000 mL | Freq: Two times a day (BID) | INTRAVENOUS | Status: DC

## 2017-01-02 MED ORDER — SODIUM CHLORIDE 0.9 % IV SOLN
8.0000 mg/h | INTRAVENOUS | Status: DC
  Administered 2017-01-02 – 2017-01-03 (×2): 8 mg/h via INTRAVENOUS
  Filled 2017-01-02 (×4): qty 80

## 2017-01-02 MED ORDER — DORZOLAMIDE HCL-TIMOLOL MAL 2-0.5 % OP SOLN
1.0000 [drp] | Freq: Two times a day (BID) | OPHTHALMIC | Status: DC
  Administered 2017-01-02 – 2017-01-03 (×2): 1 [drp] via OPHTHALMIC
  Filled 2017-01-02 (×2): qty 10

## 2017-01-02 MED ORDER — VITAMIN K1 10 MG/ML IJ SOLN
10.0000 mg | Freq: Once | INTRAVENOUS | Status: AC
  Administered 2017-01-02: 10 mg via INTRAVENOUS
  Filled 2017-01-02: qty 1

## 2017-01-02 MED ORDER — ACETAMINOPHEN 325 MG PO TABS
650.0000 mg | ORAL_TABLET | Freq: Four times a day (QID) | ORAL | Status: DC | PRN
  Administered 2017-01-03: 650 mg via ORAL
  Filled 2017-01-02: qty 2

## 2017-01-02 MED ORDER — ACETAMINOPHEN 650 MG RE SUPP: 650.0000 mg | Freq: Four times a day (QID) | RECTAL | Status: DC | PRN

## 2017-01-02 MED ORDER — CALCIUM CARBONATE-VITAMIN D 500-200 MG-UNIT PO TABS: 1.0000 | ORAL_TABLET | Freq: Every day | ORAL | Status: DC

## 2017-01-02 MED ORDER — PANTOPRAZOLE SODIUM 40 MG IV SOLR: 40.0000 mg | Freq: Two times a day (BID) | INTRAVENOUS | Status: DC

## 2017-01-02 MED ORDER — ADULT MULTIVITAMIN W/MINERALS CH
1.0000 | ORAL_TABLET | Freq: Every day | ORAL | Status: DC
  Administered 2017-01-03: 1 via ORAL
  Filled 2017-01-02: qty 1

## 2017-01-02 MED ORDER — SODIUM CHLORIDE 0.9 % IV SOLN: 10.0000 mL/h | Freq: Once | INTRAVENOUS | Status: DC

## 2017-01-02 MED ORDER — ONDANSETRON HCL 4 MG PO TABS: 4.0000 mg | ORAL_TABLET | Freq: Four times a day (QID) | ORAL | Status: DC | PRN

## 2017-01-02 MED ORDER — SODIUM CHLORIDE 0.9 % IV SOLN
80.0000 mg | Freq: Once | INTRAVENOUS | Status: AC
  Administered 2017-01-02: 18:00:00 80 mg via INTRAVENOUS
  Filled 2017-01-02: qty 80

## 2017-01-02 MED ORDER — ONDANSETRON HCL 4 MG/2ML IJ SOLN: 4.0000 mg | Freq: Four times a day (QID) | INTRAMUSCULAR | Status: DC | PRN

## 2017-01-02 MED ORDER — INSULIN ASPART 100 UNIT/ML ~~LOC~~ SOLN: 0.0000 [IU] | Freq: Every day | SUBCUTANEOUS | Status: DC

## 2017-01-02 MED ORDER — FUROSEMIDE 10 MG/ML IJ SOLN
20.0000 mg | Freq: Once | INTRAMUSCULAR | Status: AC
  Administered 2017-01-02: 20 mg via INTRAVENOUS
  Filled 2017-01-02: qty 2

## 2017-01-02 MED ORDER — LATANOPROST 0.005 % OP SOLN
1.0000 [drp] | Freq: Every day | OPHTHALMIC | Status: DC
  Administered 2017-01-02: 1 [drp] via OPHTHALMIC
  Filled 2017-01-02: qty 2.5

## 2017-01-02 MED ORDER — TIOTROPIUM BROMIDE MONOHYDRATE 18 MCG IN CAPS
18.0000 ug | ORAL_CAPSULE | Freq: Every day | RESPIRATORY_TRACT | Status: DC
  Filled 2017-01-02: qty 5

## 2017-01-02 MED ORDER — FERROUS SULFATE 325 (65 FE) MG PO TABS
325.0000 mg | ORAL_TABLET | Freq: Every day | ORAL | Status: DC
  Administered 2017-01-03: 325 mg via ORAL
  Filled 2017-01-02: qty 1

## 2017-01-02 MED ORDER — HYDROCODONE-ACETAMINOPHEN 5-325 MG PO TABS: 1.0000 | ORAL_TABLET | Freq: Four times a day (QID) | ORAL | Status: DC | PRN

## 2017-01-02 MED ORDER — INSULIN ASPART 100 UNIT/ML ~~LOC~~ SOLN
0.0000 [IU] | Freq: Three times a day (TID) | SUBCUTANEOUS | Status: DC
  Administered 2017-01-03: 3 [IU] via SUBCUTANEOUS

## 2017-01-02 MED ORDER — MAGNESIUM OXIDE 400 (241.3 MG) MG PO TABS
400.0000 mg | ORAL_TABLET | Freq: Every day | ORAL | Status: DC
  Administered 2017-01-03: 400 mg via ORAL
  Filled 2017-01-02: qty 1

## 2017-01-02 MED ORDER — VITAMIN E 180 MG (400 UNIT) PO CAPS
400.0000 [IU] | ORAL_CAPSULE | Freq: Every day | ORAL | Status: DC
  Administered 2017-01-03: 400 [IU] via ORAL
  Filled 2017-01-02 (×2): qty 1

## 2017-01-02 MED ORDER — CALCIUM CARBONATE-VIT D-MIN 600-400 MG-UNIT PO TABS: 1.0000 | ORAL_TABLET | Freq: Every day | ORAL | Status: DC

## 2017-01-02 NOTE — ED Triage Notes (Signed)
Pt up to [facility] with min. Assistance . Pt denied any pain during movement.

## 2017-01-02 NOTE — H&P (Signed)
History and Physical        Hospital Admission Note Date: 01/02/2017  Patient name: Destiny [name] [facility] Medical record number: [id number] Date of birth: February 04, 1935 Age: 82 y.o. Gender: female  PCP: Curly Rim, MD     Patient coming from: home    Chief Complaint:  Feeling dizzy, weak and shaky, hemoglobin 6.5  HPI: Patient is a 82 year old female with atrophic patient on Coumadin, diastolic CHF with EF 85-63% (per echo on 12/27/16), hyperlipidemia, diabetes mellitus, chronic back pain and presented to ED feeling dizzy, weak and shaky. Patient reported that for last 1 week she has been feeling very weak. She was evaluated by her PCP today and was found to have severe anemia. Patient also reported lightheadedness however did not have any syncopal episode in the last 1 week. Patient is on Coumadin for atrial fibrillation, INR 2.0 in ED. Patient also has been taking ibuprofen for chronic back pain. Hemoglobin was found to be 6.5 Patient did not report any dark stools, however FOBT was positive  ED work-up/course:  Temperature 98.2, respiratory rate 17, pulse 92 at the time of triage BP was 83/53, improved to 103/49 Sodium 139, BUN 48, creatinine 1.9 (baseline creatinine 1.2-1.3) Hemoglobin 6.5, platelets 236, INR 2.0. Baseline hemoglobin 8.9, on 2/16 hemoglobin was 8.3  Review of Systems: Positives marked in 'bold' Constitutional: Denies fever, chills, diaphoresis, poor appetite and fatigue.  HEENT: Denies photophobia, eye pain, redness, hearing loss, ear pain, congestion, sore throat, rhinorrhea, sneezing, mouth sores, trouble swallowing, neck pain, neck stiffness and tinnitus.   Respiratory: Denies SOB, DOE, cough, chest tightness,  and wheezing.   Cardiovascular: Denies chest pain, palpitations and leg swelling.  Gastrointestinal: Denies nausea, vomiting, abdominal pain,  diarrhea, constipation, +blood in stool  Genitourinary: Denies dysuria, urgency, frequency, hematuria, flank pain and difficulty urinating.  Musculoskeletal: Denies myalgias, back pain, joint swelling, arthralgias and gait problem.  Skin: Denies pallor, rash and wound.  Neurological:  feeling dizzy, lightheadedness and generalized weak. Denied any syncopal episode numbness or tingling  Hematological: Denies adenopathy. Easy bruising, personal or family bleeding history  Psychiatric/Behavioral: Denies suicidal ideation, mood changes, confusion, nervousness, sleep disturbance and agitation  Past Medical History: Past Medical History:  Diagnosis Date  . Aortic calcification (HCC) 02/20/2016   Involving entire thoracic and abdominal aorta  . Atrial fibrillation ([place]) 1999-diagnosed  . Calculus of kidney   . Cataract    left eye (removed)  . Complication of anesthesia    slow to awaken  . Diabetes mellitus without mention of complication   . Diastolic CHF, chronic ([place]) 12/30/2014  . Edema   . Incidental pulmonary nodule    Results of CT scan:  Lungs/Pleura: There is some progressive scarring and reticulonodular opacity in the left upper lobe and lingula extending to the anterior pleural surface. There is some associated mild traction bronchiectasis in the lingula. Findings are suggestive of postinflammatory/postinfectious changes. Recommend correlation with any active infectious symptoms. CT follow-up would be appro  . Mitral valve insufficiency and aortic valve insufficiency   . Mononeuritis of unspecified site   . Osteoarthrosis, unspecified whether generalized or localized, unspecified site   . Pure hypercholesterolemia   .  Unspecified essential hypertension   . Unspecified glaucoma(365.9)     Past Surgical History:  Procedure Laterality Date  . ABDOMINAL HYSTERECTOMY  2000   partial  . APPENDECTOMY  1954  . CARDIAC CATHETERIZATION N/A 11/16/2015   Procedure: Right/Left Heart Cath  and Coronary Angiography;  Surgeon: Larey Dresser, MD;  Location: [place] [facility];  Service: Cardiovascular;  Laterality: N/A;  . catarct surgery Left   . CYSTOSCOPY W/ RETROGRADES Right 11/26/2012   Procedure: CYSTOSCOPY WITH RETROGRADE PYELOGRAM;  Surgeon: Alexis Frock, MD;  Location: [facility];  Service: Urology;  Laterality: Right;  . HOLMIUM LASER APPLICATION Right 8/41/6606   Procedure: HOLMIUM LASER APPLICATION;  Surgeon: Alexis Frock, MD;  [facility];  Service: Urology;  Laterality: Right;  . JOINT REPLACEMENT Right 2009   knee  . kidney stone     ? Dr Tammi Klippel  . TEE WITHOUT CARDIOVERSION N/A 12/08/2015   Procedure: TRANSESOPHAGEAL ECHOCARDIOGRAM (TEE);  Surgeon: Larey Dresser, MD;  Location: [place];  Service: Cardiovascular;  Laterality: N/A;  . TONSILLECTOMY  1943  . TRANSESOPHAGEAL ECHOCARDIOGRAM  02/2012    Medications: Prior to Admission medications   Medication Sig Start Date End Date Taking? Authorizing Provider  atorvastatin (LIPITOR) 20 MG tablet Take 1 tablet (20 mg total) by mouth daily. 12/17/16  Yes Jolaine Artist, MD  Calcium Carbonate-Vit D-Min 600-400 MG-UNIT TABS Take 1 tablet by mouth daily.    Yes Historical Provider, MD  carvedilol (COREG) 25 MG tablet Take 0.5 tablets (12.5 mg total) by mouth 2 (two) times daily. 05/23/16 11/02/17 Yes Larey Dresser, MD  Cinnamon 500 MG capsule Take 500 mg by mouth daily.     Yes Historical Provider, MD  dorzolamide-timolol (COSOPT) 22.3-6.8 MG/ML ophthalmic solution Place 1 drop into both eyes 2 (two) times daily.    Yes Historical Provider, MD  ferrous sulfate 325 (65 FE) MG tablet Take 325 mg by mouth daily with breakfast.   Yes Historical Provider, MD  furosemide (LASIX) 80 MG tablet Take 1 tablet (80 mg total) by mouth 2 (two) times daily. 05/02/16  Yes Larey Dresser, MD  gabapentin (NEURONTIN) 300 MG capsule Take 300 mg by mouth 3 (three) times daily.   Yes  Historical Provider, MD  glipiZIDE (GLUCOTROL XL) 10 MG 24 hr tablet Take 1 tablet (10 mg total) by mouth daily. 10/24/15  Yes Renee A Kuneff, DO  latanoprost (XALATAN) 0.005 % ophthalmic solution Place 1 drop into both eyes at bedtime.    Yes Historical Provider, MD  magnesium oxide (MAG-OX) 400 MG tablet Take 400 mg by mouth daily.   Yes Historical Provider, MD  metFORMIN (GLUCOPHAGE-XR) 500 MG 24 hr tablet Take 2 tablets (1,000 mg total) by mouth daily with breakfast. 12/07/15  Yes Renee A Kuneff, DO  Multiple Vitamins-Minerals (MULTIVITAL) tablet Take 1 tablet by mouth daily.     Yes Historical Provider, MD  Omega-3 Fatty Acids (FISH OIL) 1000 MG CAPS Take 1 capsule by mouth daily.    Yes Historical Provider, MD  sacubitril-valsartan (ENTRESTO) 24-26 MG Take 1 tablet by mouth 2 (two) times daily. 11/05/16  Yes Larey Dresser, MD  spironolactone (ALDACTONE) 25 MG tablet Take 0.5 tablets (12.5 mg total) by mouth daily. 11/05/16  Yes Larey Dresser, MD  tiotropium (SPIRIVA HANDIHALER) 18 MCG inhalation capsule Place 1 capsule (18 mcg total) into inhaler and inhale daily. 03/01/16  Yes Larey Dresser, MD  vitamin E 400 UNIT capsule  Take 400 Units by mouth daily.     Yes Historical Provider, MD  warfarin (COUMADIN) 5 MG tablet Take 1 tablet (5 mg total) by mouth daily. Takes 2.5mg  on Monday, and 5mg  all other days. Patient taking differently: Take 5 mg by mouth daily. Takes 2.5mg  on Monday,wednesday and friday and 5mg  all other days. 10/24/15  Yes Renee A Kuneff, DO  glucose blood (ACCU-CHEK AVIVA PLUS) test strip Use as instructed 10/28/15   Renee A Kuneff, DO    Allergies:  No Known Allergies  Social History:  reports that she quit smoking about 28 years ago. She has never used smokeless tobacco. She reports that she drinks alcohol. She reports that she does not use drugs.  Family History: Family History  Problem Relation Age of Onset  . Cancer Mother 48    leukemia   . Breast cancer Neg Hx     . Colon cancer Neg Hx     Physical Exam: Blood pressure (!) 103/49, pulse 80, temperature 98.5 F (36.9 C), temperature source Oral, resp. rate 16, height 5' (1.524 m), weight 71.2 kg (157 lb), SpO2 98 %. General: Alert, awake, oriented x3, in no acute distress. HEENT: normocephalic, atraumatic, anicteric sclera, pale conjunctiva, pupils equal and reactive to light and accomodation, oropharynx clear Neck: supple, no masses or lymphadenopathy, no goiter, no bruits  Heart: Regular rate and rhythm, without murmurs, rubs or gallops. Lungs: Clear to auscultation bilaterally, no wheezing, rales or rhonchi. Abdomen: Soft, nontender, nondistended, positive bowel sounds, no masses. Extremities: No clubbing, cyanosis or edema with positive pedal pulses. Neuro: Grossly intact, no focal neurological deficits, strength 5/5 upper and lower extremities bilaterally Psych: alert and oriented x 3, normal mood and affect Skin: no rashes or lesions, warm and dry   LABS on Admission:  Basic Metabolic Panel:  Recent Labs Lab 01/02/17 1342  NA 139  K 3.9  CL 101  CO2 27  GLUCOSE 213*  BUN 48*  CREATININE 1.92*  CALCIUM 9.3   Liver Function Tests:  Recent Labs Lab 01/02/17 1342  AST 21  ALT 11*  ALKPHOS 35*  BILITOT 0.5  PROT 6.3*  ALBUMIN 3.5   No results for input(s): LIPASE, AMYLASE in the last 168 hours. No results for input(s): AMMONIA in the last 168 hours. CBC:  Recent Labs Lab 01/02/17 1342  WBC 6.7  HGB 6.5*  HCT 21.4*  MCV 89.2  PLT 236   Cardiac Enzymes: No results for input(s): CKTOTAL, CKMB, CKMBINDEX, TROPONINI in the last 168 hours. BNP: Invalid input(s): POCBNP CBG: No results for input(s): GLUCAP in the last 168 hours.  Radiological Exams on Admission:  No results found.  *I have personally reviewed the images above*  EKG: Independently reviewed. Rate 85, atrial fibrillation   Assessment/Plan Principal Problem:   Upper GI bleed with acute on  chronic anemia, normocytic -Admit to stepdown unit, BP currently low, Hemoglobin 6.5 in ED, stool occult is positive, on Coumadin and also taking ibuprofen as needed for chronic back pain - will place on clear liquid diet, serial H&H, transfuse 2 units of packed RBCs - Place on IV Protonix - Per patient no prior endoscopy evaluations, GI consulted, recommended endoscopy and colonoscopy. - Hold Coumadin, Vitamin K 10 mg x1 given in ED  Active Problems:  Hypotensive with history of Essential hypertension - Likely due to severe anemia, upper GI bleed and on multiple antihypertensives including Coreg, Lasix, spironolactone, entresto - Hold Coreg, spironolactone, entresto.  - Patient will receive 2  units of packed RBC transfusion with Lasix in between to avoid fluid overload  Acute kidney injury on chronic kidney disease - Baseline creatinine 1.2-1.3 currently 1.9. On 2/16 it was 1.5, likely worsened due to #1 and medications - Continue 2 units packed RBCs transfusion, hold antihypertensives, ACE inhibitor, spironolactone    Atrial fibrillation (HCC) on chronic anticoagulation - Currently rate controlled, hold Coumadin due to GI bleed    Diabetes mellitus type 2, controlled (HCC) - Hold metformin, place on sliding scale insulin while inpatient   Chronic diastolic congestive heart failure (HCC) - Recent 2-D echo on 12/27/16 showed EF of 60-65%, minimum moderate aortic stenosis moderate TR - Currently compensated however will need to monitor volume due to packed RBCs transfusion, will give Lasix 20 mg IV 1 in between the packed RBCs units. - Holding spironolactone, Coreg and entresto secondary to hypotension    DVT prophylaxis: SCDs   CODE STATUS: Discussed in detail with the patient, opted to be DO NOT RESUSCITATE status   Consults called: GI   Family Communication: Admission, patients condition and plan of care including tests being ordered have been discussed with the patient and  Friend accompanying her who indicates understanding and agree with the plan and Code Status  Admission status: Inpatient, stepdown   Disposition plan: Further plan will depend as patient's clinical course evolves and further radiologic and laboratory data become available.   At the time of admission, it appears that the appropriate admission status for this patient is INPATIENT . This is judged to be reasonable and necessary in order to provide the required intensity of service to ensure the patient's safety given the presenting symptoms profound anemia, hypotension, positive stool in the blood requiring endoscopy evaluation, physical exam findings, and initial radiographic and laboratory data in the context of their chronic comorbidities.     Time Spent on Admission: 60 minutes    Kwynn Schlotter M.D. [facility] 01/02/2017, 5:36 PM Pager: [phone]  If 7PM-7AM, please contact night-coverage [email or web address] Password [email or web address]

## 2017-01-02 NOTE — ED Notes (Signed)
Attempted report x1. 

## 2017-01-02 NOTE — Consult Note (Signed)
Reason for Consult: Guaiac positive anemia Referring Physician: Hospital team  Destiny [name] is an 82 y.o. female.  HPI: Patient seen and examined and her hospital computer chart and our office computer chart was reviewed and she does not have any GI symptoms specifically since she lowered the dose of metformin and she has not noticed whether her stools were brown or dark and she has not seen any blood in her urine or from her private's and she denies any upper track complaints and has not had any previous GI workup and her family history is negative but she is on Coumadin and believes ibuprofen about 4 times a week for her back which we discussed but is not on any other aspirin products  Past Medical History:  Diagnosis Date  . Aortic calcification (HCC) 02/20/2016   Involving entire thoracic and abdominal aorta  . Atrial fibrillation ([place]) 1999-diagnosed  . Calculus of kidney   . Cataract    left eye (removed)  . Complication of anesthesia    slow to awaken  . Diabetes mellitus without mention of complication   . Diastolic CHF, chronic ([place]) 12/30/2014  . Edema   . Incidental pulmonary nodule    Results of CT scan:  Lungs/Pleura: There is some progressive scarring and reticulonodular opacity in the left upper lobe and lingula extending to the anterior pleural surface. There is some associated mild traction bronchiectasis in the lingula. Findings are suggestive of postinflammatory/postinfectious changes. Recommend correlation with any active infectious symptoms. CT follow-up would be appro  . Mitral valve insufficiency and aortic valve insufficiency   . Mononeuritis of unspecified site   . Osteoarthrosis, unspecified whether generalized or localized, unspecified site   . Pure hypercholesterolemia   . Unspecified essential hypertension   . Unspecified glaucoma(365.9)     Past Surgical History:  Procedure Laterality Date  . ABDOMINAL HYSTERECTOMY  2000   partial  . APPENDECTOMY   1954  . CARDIAC CATHETERIZATION N/A 11/16/2015   Procedure: Right/Left Heart Cath and Coronary Angiography;  Surgeon: Larey Dresser, MD;  Location: [place] [facility];  Service: Cardiovascular;  Laterality: N/A;  . catarct surgery Left   . CYSTOSCOPY W/ RETROGRADES Right 11/26/2012   Procedure: CYSTOSCOPY WITH RETROGRADE PYELOGRAM;  Surgeon: Alexis Frock, MD;  [facility];  Service: Urology;  Laterality: Right;  . HOLMIUM LASER APPLICATION Right 01/14/1447   Procedure: HOLMIUM LASER APPLICATION;  Surgeon: Alexis Frock, MD;  Location: [facility];  Service: Urology;  Laterality: Right;  . JOINT REPLACEMENT Right 2009   knee  . kidney stone     ? Dr Tammi Klippel  . TEE WITHOUT CARDIOVERSION N/A 12/08/2015   Procedure: TRANSESOPHAGEAL ECHOCARDIOGRAM (TEE);  Surgeon: Larey Dresser, MD;  Location: [place];  Service: Cardiovascular;  Laterality: N/A;  . TONSILLECTOMY  1943  . TRANSESOPHAGEAL ECHOCARDIOGRAM  02/2012    Family History  Problem Relation Age of Onset  . Cancer Mother 47    leukemia   . Breast cancer Neg Hx   . Colon cancer Neg Hx     Social History:  reports that she quit smoking about 28 years ago. She has never used smokeless tobacco. She reports that she drinks alcohol. She reports that she does not use drugs.  Allergies: No Known Allergies  Medications: I have reviewed the patient's current medications.  Results for orders placed or performed during the hospital encounter of 01/02/17 (from the past 48 hour(s))  Type and screen [place]  [place]     Status: None (Preliminary result)   Collection Time: 01/02/17  1:40 PM  Result Value Ref Range   ABO/RH(D) A POS    Antibody Screen NEG    Sample Expiration 01/05/2017    Unit Number [id number]    Blood Component Type RED CELLS,LR    Unit division 00    Status of Unit ALLOCATED    Transfusion Status OK TO TRANSFUSE    Crossmatch Result Compatible    Unit Number  [id number]    Blood Component Type RED CELLS,LR    Unit division 00    Status of Unit ISSUED    Transfusion Status OK TO TRANSFUSE    Crossmatch Result Compatible   ABO/Rh     Status: None   Collection Time: 01/02/17  1:40 PM  Result Value Ref Range   ABO/RH(D) A POS   Comprehensive metabolic panel     Status: Abnormal   Collection Time: 01/02/17  1:42 PM  Result Value Ref Range   Sodium 139 135 - 145 mmol/L   Potassium 3.9 3.5 - 5.1 mmol/L   Chloride 101 101 - 111 mmol/L   CO2 27 22 - 32 mmol/L   Glucose, Bld 213 (H) 65 - 99 mg/dL   BUN 48 (H) 6 - 20 mg/dL   Creatinine, Ser 1.92 (H) 0.44 - 1.00 mg/dL   Calcium 9.3 8.9 - 10.3 mg/dL   Total Protein 6.3 (L) 6.5 - 8.1 g/dL   Albumin 3.5 3.5 - 5.0 g/dL   AST 21 15 - 41 U/L   ALT 11 (L) 14 - 54 U/L   Alkaline Phosphatase 35 (L) 38 - 126 U/L   Total Bilirubin 0.5 0.3 - 1.2 mg/dL   GFR calc non Af Amer 24 (L) >60 mL/min   GFR calc Af Amer 27 (L) >60 mL/min    Comment: (NOTE) The eGFR has been calculated using the CKD EPI equation. This calculation has not been validated in all clinical situations. eGFR's persistently <60 mL/min signify possible Chronic Kidney Disease.    Anion gap 11 5 - 15  CBC     Status: Abnormal   Collection Time: 01/02/17  1:42 PM  Result Value Ref Range   WBC 6.7 4.0 - 10.5 K/uL   RBC 2.40 (L) 3.87 - 5.11 MIL/uL   Hemoglobin 6.5 (LL) 12.0 - 15.0 g/dL    Comment: QNS TO REPEAT CRITICAL RESULT CALLED TO, READ BACK BY AND VERIFIED WITH: MELANIE COMPTON,RN AT 1412 01/02/17 BY ZBEECH.    HCT 21.4 (L) 36.0 - 46.0 %   MCV 89.2 78.0 - 100.0 fL   MCH 27.1 26.0 - 34.0 pg   MCHC 30.4 30.0 - 36.0 g/dL   RDW 15.6 (H) 11.5 - 15.5 %   Platelets 236 150 - 400 K/uL    Comment: PLATELET COUNT CONFIRMED BY SMEAR  Protime-INR - (order if Patient is taking Coumadin / Warfarin)     Status: Abnormal   Collection Time: 01/02/17  1:42 PM  Result Value Ref Range   Prothrombin Time 23.7 (H) 11.4 - 15.2 seconds   INR  2.08   POC occult blood, ED     Status: Abnormal   Collection Time: 01/02/17  3:04 PM  Result Value Ref Range   Fecal Occult Bld POSITIVE (A) NEGATIVE  Prepare RBC     Status: None   Collection Time: 01/02/17  3:32 PM  Result Value Ref Range   Order Confirmation ORDER PROCESSED BY BLOOD  BANK     No results found.  ROS negative except above Blood pressure 100/69, pulse 80, temperature 97.9 F (36.6 C), temperature source Oral, resp. rate 16, height 5' (1.524 m), weight 71.2 kg (157 lb), SpO2 98 %. Physical Exam vital signs stable afebrile no acute distress lungs are clear regular rate and rhythm abdomen is soft nontender labs and previous workup reviewed  Assessment/Plan: Multiple medical problems in a patient with anemia on Coumadin and dark stools per ER physician Plan: The risks benefits methods of endoscopy and colonoscopy was thoroughly discussed with the patient and her friend and will proceed with an endoscopy tomorrow and she can have a regular meal today but nothing by mouth after midnight with further workup and plans pending those findings and possibly will leave him proceed with an outpatient colonoscopy if patient would like and hold Coumadin for now until workup done  [facility] E 01/02/2017, 5:21 PM

## 2017-01-02 NOTE — ED Provider Notes (Signed)
[facility] DEPT Provider Note   CSN: [id number] Arrival date & time: 01/02/17 1311     History    Chief Complaint  Patient presents with  . Abnormal Lab     HPI Destiny [name] is a 82 y.o. female.  82yo F w/ PMH including A fib on coumadin, dCHF, HTN who p/w Shakiness. Approximately 6 days ago, the patient began noticing tremors and shakiness of her hands. She has had several days of generalized weakness. She was evaluated at her PCP office today and was sent here due to anemia. She has noticed some lightheadedness today but denies any symptoms prior to today. She has not noticed any changes in her stool. No vomiting, diarrhea, bloody stool, abd pain, chest pain, shortness of breath, fevers, or recent illness. She is compliant with all of her medications.   Past Medical History:  Diagnosis Date  . Aortic calcification (HCC) 02/20/2016   Involving entire thoracic and abdominal aorta  . Atrial fibrillation ([place]) 1999-diagnosed  . Calculus of kidney   . Cataract    left eye (removed)  . Complication of anesthesia    slow to awaken  . Diabetes mellitus without mention of complication   . Diastolic CHF, chronic ([place]) 12/30/2014  . Edema   . Incidental pulmonary nodule    Results of CT scan:  Lungs/Pleura: There is some progressive scarring and reticulonodular opacity in the left upper lobe and lingula extending to the anterior pleural surface. There is some associated mild traction bronchiectasis in the lingula. Findings are suggestive of postinflammatory/postinfectious changes. Recommend correlation with any active infectious symptoms. CT follow-up would be appro  . Mitral valve insufficiency and aortic valve insufficiency   . Mononeuritis of unspecified site   . Osteoarthrosis, unspecified whether generalized or localized, unspecified site   . Pure hypercholesterolemia   . Unspecified essential hypertension   . Unspecified glaucoma(365.9)      Patient Active Problem  List   Diagnosis Date Noted  . Normocytic anemia 11/23/2016  . Aortic calcification ([place]) 02/20/2016  . Incidental pulmonary nodule 02/02/2016  . Pulmonary hypertension 01/02/2016  . Chronic combined systolic and diastolic congestive heart failure ([place]) 10/25/2015  . Aortic stenosis 10/25/2015  . Chronic anticoagulation 10/24/2015  . Diabetes mellitus type 2, controlled ([place]) 07/13/2014  . Skin lesion 07/02/2014  . Carotid bruit 11/03/2013  . CARDIAC MURMUR 06/21/2009  . HYPERCHOLESTEROLEMIA 06/20/2009  . NEUROPATHY 06/20/2009  . GLAUCOMA 06/20/2009  . MITRAL INSUFFICIENCY 06/20/2009  . Essential hypertension 06/20/2009  . Atrial fibrillation ([place]) 06/20/2009  . OSTEOARTHRITIS 06/20/2009    Past Surgical History:  Procedure Laterality Date  . ABDOMINAL HYSTERECTOMY  2000   partial  . APPENDECTOMY  1954  . CARDIAC CATHETERIZATION N/A 11/16/2015   Procedure: Right/Left Heart Cath and Coronary Angiography;  Surgeon: Larey Dresser, MD;  Location: [place] [facility];  Service: Cardiovascular;  Laterality: N/A;  . catarct surgery Left   . CYSTOSCOPY W/ RETROGRADES Right 11/26/2012   Procedure: CYSTOSCOPY WITH RETROGRADE PYELOGRAM;  Surgeon: Alexis Frock, MD;  Location: [facility];  Service: Urology;  Laterality: Right;  . HOLMIUM LASER APPLICATION Right 0/94/7096   Procedure: HOLMIUM LASER APPLICATION;  Surgeon: Alexis Frock, MD;  [facility];  Service: Urology;  Laterality: Right;  . JOINT REPLACEMENT Right 2009   knee  . kidney stone     ? Dr Tammi Klippel  . TEE WITHOUT CARDIOVERSION N/A 12/08/2015   Procedure: TRANSESOPHAGEAL ECHOCARDIOGRAM (TEE);  Surgeon: Larey Dresser,  MD;  Location: [place];  Service: Cardiovascular;  Laterality: N/A;  . TONSILLECTOMY  1943  . TRANSESOPHAGEAL ECHOCARDIOGRAM  02/2012    OB History    No data available        Home Medications    Prior to Admission medications   Medication Sig Start Date  End Date Taking? Authorizing Provider  atorvastatin (LIPITOR) 20 MG tablet Take 1 tablet (20 mg total) by mouth daily. 12/17/16   Jolaine Artist, MD  Calcium Carbonate-Vit D-Min 600-400 MG-UNIT TABS Take 1 tablet by mouth daily.     Historical Provider, MD  carvedilol (COREG) 25 MG tablet Take 0.5 tablets (12.5 mg total) by mouth 2 (two) times daily. 05/23/16 11/02/17  Larey Dresser, MD  Cinnamon 500 MG capsule Take 500 mg by mouth daily.      Historical Provider, MD  dorzolamide-timolol (COSOPT) 22.3-6.8 MG/ML ophthalmic solution Place 1 drop into both eyes 2 (two) times daily.     Historical Provider, MD  furosemide (LASIX) 80 MG tablet Take 1 tablet (80 mg total) by mouth 2 (two) times daily. 05/02/16   Larey Dresser, MD  gabapentin (NEURONTIN) 300 MG capsule Take 300 mg by mouth 3 (three) times daily.    Historical Provider, MD  glipiZIDE (GLUCOTROL XL) 10 MG 24 hr tablet Take 1 tablet (10 mg total) by mouth daily. 10/24/15   Renee A Kuneff, DO  glucose blood (ACCU-CHEK AVIVA PLUS) test strip Use as instructed 10/28/15   Renee A Kuneff, DO  latanoprost (XALATAN) 0.005 % ophthalmic solution Place 1 drop into both eyes at bedtime.     Historical Provider, MD  magnesium oxide (MAG-OX) 400 MG tablet Take 400 mg by mouth daily.    Historical Provider, MD  metFORMIN (GLUCOPHAGE-XR) 500 MG 24 hr tablet Take 2 tablets (1,000 mg total) by mouth daily with breakfast. 12/07/15   Renee A Kuneff, DO  Multiple Vitamins-Minerals (MULTIVITAL) tablet Take 1 tablet by mouth daily.      Historical Provider, MD  Omega-3 Fatty Acids (FISH OIL) 1000 MG CAPS Take 1 capsule by mouth daily.     Historical Provider, MD  sacubitril-valsartan (ENTRESTO) 24-26 MG Take 1 tablet by mouth 2 (two) times daily. 11/05/16   Larey Dresser, MD  spironolactone (ALDACTONE) 25 MG tablet Take 0.5 tablets (12.5 mg total) by mouth daily. 11/05/16   Larey Dresser, MD  tiotropium (SPIRIVA HANDIHALER) 18 MCG inhalation capsule Place 1  capsule (18 mcg total) into inhaler and inhale daily. 03/01/16   Larey Dresser, MD  vitamin E 400 UNIT capsule Take 400 Units by mouth daily.      Historical Provider, MD  warfarin (COUMADIN) 5 MG tablet Take 1 tablet (5 mg total) by mouth daily. Takes 2.5mg  on Monday, and 5mg  all other days. Patient taking differently: Take 5 mg by mouth daily. Takes 2.5mg  on Monday,wednesday and friday and 5mg  all other days. 10/24/15   Renee A Kuneff, DO      Family History  Problem Relation Age of Onset  . Cancer Mother 13    leukemia   . Breast cancer Neg Hx   . Colon cancer Neg Hx      Social History  Substance Use Topics  . Smoking status: Former Smoker    Quit date: 07/11/1988  . Smokeless tobacco: Never Used  . Alcohol use Yes     Comment: occasional     Allergies     Patient has no known allergies.  Review of Systems  10 Systems reviewed and are negative for acute change except as noted in the HPI.   Physical Exam Updated Vital Signs BP 100/75   Pulse 79   Temp 98.2 F (36.8 C) (Oral)   Resp (!) 23   Ht 5' (1.524 m)   Wt 157 lb (71.2 kg)   SpO2 98%   BMI 30.66 kg/m   Physical Exam  Constitutional: She is oriented to person, place, and time. She appears well-developed and well-nourished. No distress.  HENT:  Head: Normocephalic and atraumatic.  Moist mucous membranes  Eyes: Pupils are equal, round, and reactive to light.  Pale conjunctivae  Neck: Neck supple.  Cardiovascular: Normal rate and regular rhythm.   Murmur heard. Pulmonary/Chest: Effort normal and breath sounds normal.  Abdominal: Soft. Bowel sounds are normal. She exhibits no distension. There is no tenderness.  Genitourinary:  Genitourinary Comments: Melena on rectal exam, normal rectal tone Chaperone was present during exam.   Musculoskeletal: She exhibits no edema.  Neurological: She is alert and oriented to person, place, and time.  Fluent speech  Skin: Skin is warm and dry.    Psychiatric: She has a normal mood and affect. Judgment normal.  Nursing note and vitals reviewed.     ED Treatments / Results  Labs (all labs ordered are listed, but only abnormal results are displayed) Labs Reviewed  COMPREHENSIVE METABOLIC PANEL - Abnormal; Notable for the following:       Result Value   Glucose, Bld 213 (*)    BUN 48 (*)    Creatinine, Ser 1.92 (*)    Total Protein 6.3 (*)    ALT 11 (*)    Alkaline Phosphatase 35 (*)    GFR calc non Af Amer 24 (*)    GFR calc Af Amer 27 (*)    All other components within normal limits  CBC - Abnormal; Notable for the following:    RBC 2.40 (*)    Hemoglobin 6.5 (*)    HCT 21.4 (*)    RDW 15.6 (*)    All other components within normal limits  PROTIME-INR - Abnormal; Notable for the following:    Prothrombin Time 23.7 (*)    All other components within normal limits  POC OCCULT BLOOD, ED - Abnormal; Notable for the following:    Fecal Occult Bld POSITIVE (*)    All other components within normal limits  TYPE AND SCREEN  ABO/RH  PREPARE RBC (CROSSMATCH)     EKG  EKG Interpretation  Date/Time:    Ventricular Rate:    PR Interval:    QRS Duration:   QT Interval:    QTC Calculation:   R Axis:     Text Interpretation:           Radiology No results found.  Procedures Procedures (including critical care time) .Critical Care Performed by: Sharlett Iles Authorized by: Sharlett Iles   Critical care provider statement:    Critical care time (minutes):  35   Critical care time was exclusive of:  Separately billable procedures and treating other patients   Critical care was necessary to treat or prevent imminent or life-threatening deterioration of the following conditions: GI bleed, anemia.   Critical care was time spent personally by me on the following activities:  Development of treatment plan with patient or surrogate, discussions with consultants, evaluation of patient's response to  treatment, examination of patient, obtaining history from patient or surrogate, ordering and performing treatments  and interventions, ordering and review of laboratory studies, ordering and review of radiographic studies, re-evaluation of patient's condition and review of old charts    Medications Ordered in ED  Medications  phytonadione (VITAMIN K) 10 mg in dextrose 5 % 50 mL IVPB (10 mg Intravenous New Bag/Given 01/02/17 1547)  0.9 %  sodium chloride infusion (not administered)     Initial Impression / Assessment and Plan / ED Course  I have reviewed the triage vital signs and the nursing notes.  Pertinent labs  that were available during my care of the patient were reviewed by me and considered in my medical decision making (see chart for details).     Pt w/ vital days of shakiness, lightheadedness today. Sent here for low hemoglobin at PCP office. On exam, she was awake and alert, comfortable. Borderline hypotensive but normal heart rate, mentating appropriately, no complaints. She did have melena on exam. She was unaware of any GI bleeding. Obtained above lab work including type and screen. Hemoccult positive. INR 2.08, hemoglobin 6.5 which is down from usual hemoglobin around 8. Creatinine is 1.92 which is not drastically different from her baseline but BUN is elevated at 48, suggestive of GI bleed. Discussed risks and benefits of blood transfusion and obtained consent for 2 units of RBCs. Also gave IV vitamin K for Coumadin reversal. Contacted GI and discussed with Dr. Watt Climes, who will see pt in consultation. Discussed with [facility] hospitalist, Dr. Tana Coast, appreciate assistance. Pt admitted to stepdown for further treatment.  Final Clinical Impressions(s) / ED Diagnoses   Final diagnoses:  Gastrointestinal hemorrhage with melena  Acute blood loss anemia     New Prescriptions   No medications on file       Sharlett Iles, MD 01/02/17 1624

## 2017-01-02 NOTE — ED Triage Notes (Signed)
Pt reports going to pcp on Friday after "feeling shaky" on Thursday night. Was called today to come here due to low HGb. Pt is hypotensive at triage, appears pale but has no major complaints at this time. Is on coumadin but denies any dark stools.

## 2017-01-03 ENCOUNTER — Encounter (HOSPITAL_COMMUNITY)
Admission: EM | Disposition: A | Discharge status: Home / Self Care | Payer: Self-pay | Source: Home / Self Care | Attending: Internal Medicine

## 2017-01-03 ENCOUNTER — Inpatient Hospital Stay (HOSPITAL_COMMUNITY): Payer: Medicare Other | Admitting: Certified Registered Nurse Anesthetist

## 2017-01-03 ENCOUNTER — Encounter (HOSPITAL_COMMUNITY): Payer: Self-pay

## 2017-01-03 DIAGNOSIS — Z7901 Long term (current) use of anticoagulants: Secondary | ICD-10-CM | POA: Diagnosis not present

## 2017-01-03 DIAGNOSIS — K254 Chronic or unspecified gastric ulcer with hemorrhage: Secondary | ICD-10-CM | POA: Diagnosis not present

## 2017-01-03 DIAGNOSIS — I5042 Chronic combined systolic (congestive) and diastolic (congestive) heart failure: Secondary | ICD-10-CM | POA: Diagnosis not present

## 2017-01-03 DIAGNOSIS — K922 Gastrointestinal hemorrhage, unspecified: Secondary | ICD-10-CM | POA: Diagnosis not present

## 2017-01-03 DIAGNOSIS — I482 Chronic atrial fibrillation: Secondary | ICD-10-CM | POA: Diagnosis not present

## 2017-01-03 DIAGNOSIS — D62 Acute posthemorrhagic anemia: Secondary | ICD-10-CM | POA: Diagnosis not present

## 2017-01-03 HISTORY — PX: ESOPHAGOGASTRODUODENOSCOPY (EGD) WITH PROPOFOL: SHX5813

## 2017-01-03 LAB — CBC WITH DIFFERENTIAL/PLATELET
BASOS PCT: 0 %
Basophils Absolute: 0 10*3/uL (ref 0.0–0.1)
EOS ABS: 0.3 10*3/uL (ref 0.0–0.7)
EOS PCT: 4 %
HCT: 28.1 % — ABNORMAL LOW (ref 36.0–46.0)
HEMOGLOBIN: 8.6 g/dL — AB (ref 12.0–15.0)
LYMPHS ABS: 1 10*3/uL (ref 0.7–4.0)
Lymphocytes Relative: 15 %
MCH: 26.9 pg (ref 26.0–34.0)
MCHC: 30.6 g/dL (ref 30.0–36.0)
MCV: 87.8 fL (ref 78.0–100.0)
Monocytes Absolute: 0.7 10*3/uL (ref 0.1–1.0)
Monocytes Relative: 10 %
NEUTROS PCT: 71 %
Neutro Abs: 4.8 10*3/uL (ref 1.7–7.7)
PLATELETS: 214 10*3/uL (ref 150–400)
RBC: 3.2 MIL/uL — AB (ref 3.87–5.11)
RDW: 15.3 % (ref 11.5–15.5)
WBC: 6.8 10*3/uL (ref 4.0–10.5)

## 2017-01-03 LAB — TYPE AND SCREEN
ABO/RH(D): A POS
ANTIBODY SCREEN: NEGATIVE
UNIT DIVISION: 0
Unit division: 0

## 2017-01-03 LAB — HEMOGLOBIN AND HEMATOCRIT, BLOOD
HEMATOCRIT: 28.1 % — AB (ref 36.0–46.0)
HEMOGLOBIN: 8.6 g/dL — AB (ref 12.0–15.0)

## 2017-01-03 LAB — PROTIME-INR
INR: 1.51
PROTHROMBIN TIME: 18.4 s — AB (ref 11.4–15.2)

## 2017-01-03 LAB — MRSA PCR SCREENING: MRSA by PCR: NEGATIVE

## 2017-01-03 LAB — GLUCOSE, CAPILLARY
GLUCOSE-CAPILLARY: 101 mg/dL — AB (ref 65–99)
GLUCOSE-CAPILLARY: 117 mg/dL — AB (ref 65–99)
Glucose-Capillary: 121 mg/dL — ABNORMAL HIGH (ref 65–99)
Glucose-Capillary: 216 mg/dL — ABNORMAL HIGH (ref 65–99)

## 2017-01-03 LAB — BPAM RBC
BLOOD PRODUCT EXPIRATION DATE: 201804132359
Blood Product Expiration Date: 201804132359
ISSUE DATE / TIME: 201803282345
ISSUE DATE / TIME: 201803281658
UNIT TYPE AND RH: 6200
Unit Type and Rh: 6200

## 2017-01-03 LAB — BASIC METABOLIC PANEL
Anion gap: 8 (ref 5–15)
BUN: 38 mg/dL — ABNORMAL HIGH (ref 6–20)
CALCIUM: 9.2 mg/dL (ref 8.9–10.3)
CO2: 28 mmol/L (ref 22–32)
CREATININE: 1.67 mg/dL — AB (ref 0.44–1.00)
Chloride: 105 mmol/L (ref 101–111)
GFR, EST AFRICAN AMERICAN: 32 mL/min — AB (ref >60)
GFR, EST NON AFRICAN AMERICAN: 28 mL/min — AB (ref >60)
GLUCOSE: 178 mg/dL — AB (ref 65–99)
Potassium: 4.2 mmol/L (ref 3.5–5.1)
Sodium: 141 mmol/L (ref 135–145)

## 2017-01-03 SURGERY — ESOPHAGOGASTRODUODENOSCOPY (EGD) WITH PROPOFOL
Anesthesia: Monitor Anesthesia Care

## 2017-01-03 MED ORDER — PROPOFOL 500 MG/50ML IV EMUL
INTRAVENOUS | Status: DC | PRN
  Administered 2017-01-03: 75 ug/kg/min via INTRAVENOUS

## 2017-01-03 MED ORDER — SODIUM CHLORIDE 0.9 % IV SOLN: INTRAVENOUS | Status: DC

## 2017-01-03 MED ORDER — WARFARIN SODIUM 5 MG PO TABS: 5.0000 mg | ORAL_TABLET | Freq: Every day | ORAL | 0 refills | Status: DC

## 2017-01-03 MED ORDER — CARVEDILOL 12.5 MG PO TABS
12.5000 mg | ORAL_TABLET | Freq: Two times a day (BID) | ORAL | Status: DC
  Administered 2017-01-03: 12.5 mg via ORAL
  Filled 2017-01-03: qty 1

## 2017-01-03 MED ORDER — FUROSEMIDE 80 MG PO TABS: 80.0000 mg | ORAL_TABLET | Freq: Two times a day (BID) | ORAL | Status: DC

## 2017-01-03 MED ORDER — PANTOPRAZOLE SODIUM 40 MG PO TBEC: 40.0000 mg | DELAYED_RELEASE_TABLET | Freq: Every day | ORAL | 0 refills | Status: AC

## 2017-01-03 MED ORDER — PROPOFOL 10 MG/ML IV BOLUS
INTRAVENOUS | Status: DC | PRN
  Administered 2017-01-03: 20 mg via INTRAVENOUS

## 2017-01-03 MED ORDER — LIDOCAINE 2% (20 MG/ML) 5 ML SYRINGE
INTRAMUSCULAR | Status: DC | PRN
  Administered 2017-01-03: 50 mg via INTRAVENOUS

## 2017-01-03 MED ORDER — SACUBITRIL-VALSARTAN 24-26 MG PO TABS: 1.0000 | ORAL_TABLET | Freq: Two times a day (BID) | ORAL | Status: DC

## 2017-01-03 MED ORDER — LACTATED RINGERS IV SOLN
INTRAVENOUS | Status: DC | PRN
  Administered 2017-01-03: 08:00:00 via INTRAVENOUS

## 2017-01-03 MED ORDER — HYDROMORPHONE HCL 1 MG/ML IJ SOLN: 0.2500 mg | INTRAMUSCULAR | Status: DC | PRN

## 2017-01-03 MED ORDER — PHENYLEPHRINE 40 MCG/ML (10ML) SYRINGE FOR IV PUSH (FOR BLOOD PRESSURE SUPPORT)
PREFILLED_SYRINGE | INTRAVENOUS | Status: DC | PRN
  Administered 2017-01-03: 80 ug via INTRAVENOUS

## 2017-01-03 MED ORDER — PANTOPRAZOLE SODIUM 40 MG PO TBEC
40.0000 mg | DELAYED_RELEASE_TABLET | Freq: Every day | ORAL | Status: DC
  Administered 2017-01-03: 40 mg via ORAL
  Filled 2017-01-03: qty 1

## 2017-01-03 MED ORDER — ACETAMINOPHEN 325 MG PO TABS: 650.0000 mg | ORAL_TABLET | Freq: Four times a day (QID) | ORAL | 0 refills | Status: AC | PRN

## 2017-01-03 MED ORDER — PROMETHAZINE HCL 25 MG/ML IJ SOLN: 6.2500 mg | INTRAMUSCULAR | Status: DC | PRN

## 2017-01-03 NOTE — Anesthesia Preprocedure Evaluation (Addendum)
Anesthesia Evaluation  Patient identified by MRN, date of birth, ID band Patient awake    Reviewed: Allergy & Precautions, H&P , NPO status , Patient's Chart, lab work & pertinent test results  History of Anesthesia Complications (+) PROLONGED EMERGENCE and history of anesthetic complications  Airway Mallampati: II  TM Distance: >3 FB Neck ROM: Full    Dental no notable dental hx. (+) Edentulous Upper, Edentulous Lower   Pulmonary neg pulmonary ROS, former smoker,    Pulmonary exam normal breath sounds clear to auscultation       Cardiovascular hypertension, Pt. on medications + dysrhythmias Atrial Fibrillation + Valvular Problems/Murmurs (mild AS by Echo 2013. No MR. EF 60%) AS  Rhythm:Irregular Rate:Normal + Systolic murmurs Study Conclusions  - Left ventricle: The cavity size was normal. There was moderate   concentric hypertrophy. Systolic function was normal. The   estimated ejection fraction was in the range of 60% to 65%. Wall   motion was normal; there were no regional wall motion   abnormalities. - Aortic valve: Valve mobility was restricted. There was moderate   stenosis. Valve area (VTI): 1.14 cm^2. Valve area (Vmax): 1.08   cm^2. Valve area (Vmean): 1.21 cm^2. - Mitral valve: Calcified annulus. Mildly thickened leaflets . - Left atrium: The atrium was severely dilated. - Right atrium: The atrium was severely dilated.   Neuro/Psych negative neurological ROS  negative psych ROS   GI/Hepatic negative GI ROS, Neg liver ROS,   Endo/Other  negative endocrine ROSdiabetes, Insulin Dependent, Oral Hypoglycemic Agents  Renal/GU Renal InsufficiencyRenal disease  negative genitourinary   Musculoskeletal negative musculoskeletal ROS (+)   Abdominal   Peds negative pediatric ROS (+)  Hematology negative hematology ROS (+)   Anesthesia Other Findings   Reproductive/Obstetrics negative OB ROS                            Anesthesia Physical  Anesthesia Plan  ASA: III  Anesthesia Plan: MAC   Post-op Pain Management:    Induction:   Airway Management Planned: Natural Airway and Simple Face Mask  Additional Equipment:   Intra-op Plan:   Post-operative Plan: Extubation in OR  Informed Consent: I have reviewed the patients History and Physical, chart, labs and discussed the procedure including the risks, benefits and alternatives for the proposed anesthesia with the patient or authorized representative who has indicated his/her understanding and acceptance.   Dental advisory given  Plan Discussed with: CRNA and Anesthesiologist  Anesthesia Plan Comments:        Anesthesia Quick Evaluation

## 2017-01-03 NOTE — Transfer of Care (Signed)
Immediate Anesthesia Transfer of Care Note  Patient: Destiny [name]  Procedure(s) Performed: Procedure(s): ESOPHAGOGASTRODUODENOSCOPY (EGD) WITH PROPOFOL (N/A)  Patient Location: Endoscopy Unit  Anesthesia Type:MAC  Level of Consciousness: awake, alert  and oriented  Airway & Oxygen Therapy: Patient Spontanous Breathing and Patient connected to nasal cannula oxygen  Post-op Assessment: Report given to RN and Post -op Vital signs reviewed and stable  Post vital signs: Reviewed and stable  Last Vitals:  Vitals:   01/03/17 0710 01/03/17 0819  BP: (!) 143/68 (!) 94/49  Pulse: 91 89  Resp: 17 14  Temp: 36.6 C 36.5 C    Last Pain:  Vitals:   01/03/17 0819  TempSrc: Oral  PainSc:          Complications: No apparent anesthesia complications

## 2017-01-03 NOTE — Anesthesia Procedure Notes (Signed)
Procedure Name: MAC Date/Time: 01/03/2017 7:58 AM Performed by: Candis Shine Pre-anesthesia Checklist: Patient identified, Emergency Drugs available, Suction available, Patient being monitored and Timeout performed Patient Re-evaluated:Patient Re-evaluated prior to inductionOxygen Delivery Method: Nasal cannula Dental Injury: Teeth and Oropharynx as per pre-operative assessment

## 2017-01-03 NOTE — Progress Notes (Signed)
Destiny [name] 8:00 AM  Subjective: Patient doing well without any signs of bleeding and no new complaints  Objective: Vital signs stable afebrile no acute distress exam please see preassessment evaluation last night hemoglobin increased a little  Assessment: Subacute GI bleeding in patient on Coumadin  Plan: Okay to proceed with EGD today with anesthesia assistance  [facility] E  Pager [phone] After 5PM or if no answer call [phone]

## 2017-01-03 NOTE — Discharge Summary (Signed)
Physician Discharge Summary  Lamona Eimer [facility] VZD:[id number] DOB: 12-Jun-1935 DOA: 01/02/2017  PCP: Curly Rim, MD  Admit date: 01/02/2017 Discharge date: 01/03/2017  Admitted From: home Disposition:  home  Recommendations for Outpatient Follow-up:  1. Follow up with PCP in 1-2 weeks 2. Please obtain BMP/CBC in one week 3. Needs to follow up with Dr Watt Climes for colonoscopy.  4. Follow up with cardiology for HF.     Discharge Condition:Stable.  CODE STATUS: DNR Diet recommendation: Heart Healthy  Brief/Interim Summary: Patient is a 82 year old female with atrophic patient on Coumadin, diastolic CHF with EF 29-51% (per echo on 12/27/16), hyperlipidemia, diabetes mellitus, chronic back painand presented to ED feeling dizzy, weak and shaky. Patient reported that for last 1 week she has been feeling very weak. She was evaluated by her PCP today and was found to have severe anemia. Patient also reported lightheadedness however did not have any syncopal episode in the last 1 week. Patient is on Coumadin for atrial fibrillation, INR 2.0 in ED. Patient also has been taking ibuprofen for chronic back pain. Hemoglobin was found to be 6.5 Patient did not report any dark stools, however FOBT was positive   Assessment & Plan:   Principal Problem:   Upper GI bleed Active Problems:   Essential hypertension   Atrial fibrillation (HCC)   Diabetes mellitus type 2, controlled (HCC)   Chronic anticoagulation   Chronic combined systolic and diastolic congestive heart failure (HCC)   Hypotension   1-GI bleed with acute on chronic Anemia;  Received 2 units of PRBC.  Coumadin was held on admission.  Underwent endoscopy, which showed clean base gastric ulcer. Await pathology.  Received Protonix gtt./  Discussed with Dr Watt Climes, ok to resume coumadin. Patient to avoid ibuprofen.  Discharge on Protonix.   2-A fib; resume coreg.  Discussed with Dr Watt Climes, patient can resume coumadin.  Resume  home dose.   Hypotensive with history ofEssential hypertension resolved.   DM type 2;  Hold oral medications. SSI.  hold metformin at discharge due to increase cr.   Chronic Diastolic HF; Compensated.  Appreciate HF team.  Patient to resume home medications.   Acute kidney injury on chronic kidney disease Baseline creatinine 1.2-1.3 on admission  1.9.  Cr has decrease to 1.6 Ok to resume HF medications per HF team.    Discharge Diagnoses:  Principal Problem:   Upper GI bleed Active Problems:   Essential hypertension   Atrial fibrillation (HCC)   Diabetes mellitus type 2, controlled ([place])   Chronic anticoagulation   Chronic combined systolic and diastolic congestive heart failure (HCC)   Hypotension    Discharge Instructions  Discharge Instructions    Diet - low sodium heart healthy    Complete by:  As directed    Increase activity slowly    Complete by:  As directed      Allergies as of 01/03/2017   No Known Allergies     Medication List    STOP taking these medications   metFORMIN 500 MG 24 hr tablet Commonly known as:  GLUCOPHAGE-XR     TAKE these medications   acetaminophen 325 MG tablet Commonly known as:  TYLENOL Take 2 tablets (650 mg total) by mouth every 6 (six) hours as needed for mild pain (or Fever >/= 101).   atorvastatin 20 MG tablet Commonly known as:  LIPITOR Take 1 tablet (20 mg total) by mouth daily.   Calcium Carbonate-Vit D-Min 600-400 MG-UNIT Tabs Take 1 tablet by mouth daily.  carvedilol 25 MG tablet Commonly known as:  COREG Take 0.5 tablets (12.5 mg total) by mouth 2 (two) times daily.   Cinnamon 500 MG capsule Take 500 mg by mouth daily.   dorzolamide-timolol 22.3-6.8 MG/ML ophthalmic solution Commonly known as:  COSOPT Place 1 drop into both eyes 2 (two) times daily.   ferrous sulfate 325 (65 FE) MG tablet Take 325 mg by mouth daily with breakfast.   Fish Oil 1000 MG Caps Take 1 capsule by mouth daily.    furosemide 80 MG tablet Commonly known as:  LASIX Take 1 tablet (80 mg total) by mouth 2 (two) times daily.   gabapentin 300 MG capsule Commonly known as:  NEURONTIN Take 300 mg by mouth 3 (three) times daily.   glipiZIDE 10 MG 24 hr tablet Commonly known as:  GLUCOTROL XL Take 1 tablet (10 mg total) by mouth daily.   glucose blood test strip Commonly known as:  ACCU-CHEK AVIVA PLUS Use as instructed   magnesium oxide 400 MG tablet Commonly known as:  MAG-OX Take 400 mg by mouth daily.   MULTIVITAL tablet Take 1 tablet by mouth daily.   pantoprazole 40 MG tablet Commonly known as:  PROTONIX Take 1 tablet (40 mg total) by mouth daily.   sacubitril-valsartan 24-26 MG Commonly known as:  ENTRESTO Take 1 tablet by mouth 2 (two) times daily.   spironolactone 25 MG tablet Commonly known as:  ALDACTONE Take 0.5 tablets (12.5 mg total) by mouth daily.   tiotropium 18 MCG inhalation capsule Commonly known as:  SPIRIVA HANDIHALER Place 1 capsule (18 mcg total) into inhaler and inhale daily.   vitamin E 400 UNIT capsule Take 400 Units by mouth daily.   warfarin 5 MG tablet Commonly known as:  COUMADIN Take 1 tablet (5 mg total) by mouth daily. Takes 2.5mg  on Monday,wednesday and friday and 5mg  all other days. What changed:  additional instructions   XALATAN 0.005 % ophthalmic solution Generic drug:  latanoprost Place 1 drop into both eyes at bedtime.      Follow-up Information    [facility] Follow up on 01/09/2017.   Specialty:  Cardiology Why:  at 0930. Please bring all of your medications to your visit. The code for parking is [phone] Contact information: [address] [id number] [place] Farwell Franklin, MD Follow up on 01/07/2017.   Specialty:  Family Medicine Why:  at 1100 am for INR check.  Contact information: [place]  [address] [phone]          No Known Allergies  Consultations:  GI  HF team    Procedures/Studies: Endoscopy; small gastric ulcer   Subjective: She is feeling well, denies abdominal pain.  Wants to go home.  Tolerating diet.   Discharge Exam: Vitals:   01/03/17 0858 01/03/17 1233  BP: (!) 175/85 116/70  Pulse: (!) 119 84  Resp: (!) 22 16  Temp: 97.3 F (36.3 C) 97.6 F (36.4 C)   Vitals:   01/03/17 0819 01/03/17 0830 01/03/17 0858 01/03/17 1233  BP: (!) 94/49 107/68 (!) 175/85 116/70  Pulse: 89 90 (!) 119 84  Resp: 14 15 (!) 22 16  Temp: 97.7 F (36.5 C)  97.3 F (36.3 C) 97.6 F (36.4 C)  TempSrc: Oral  Oral Oral  SpO2: 95% 97% 91% 93%  Weight:      Height:  General: Pt is alert, awake, not in acute distress Cardiovascular: RRR, S1/S2 +, no rubs, no gallops Respiratory: CTA bilaterally, no wheezing, no rhonchi Abdominal: Soft, NT, ND, bowel sounds + Extremities: no edema, no cyanosis    The results of significant diagnostics from this hospitalization (including imaging, microbiology, ancillary and laboratory) are listed below for reference.     Microbiology: Recent Results (from the past 240 hour(s))  MRSA PCR Screening     Status: None   Collection Time: 01/02/17  9:22 PM  Result Value Ref Range Status   MRSA by PCR NEGATIVE NEGATIVE Final    Comment:        The GeneXpert MRSA Assay (FDA approved for NASAL specimens only), is one component of a comprehensive MRSA colonization surveillance program. It is not intended to diagnose MRSA infection nor to guide or monitor treatment for MRSA infections.      Labs: BNP (last 3 results)  Recent Labs  05/02/16 1103 05/23/16 1530 11/02/16 1228  BNP 288.0* 250.7* 332.9*   Basic Metabolic Panel:  Recent Labs Lab 01/02/17 1342 01/03/17 0936  NA 139 141  K 3.9 4.2  CL 101 105  CO2 27 28  GLUCOSE 213* 178*  BUN 48* 38*  CREATININE 1.92* 1.67*  CALCIUM 9.3 9.2   Liver  Function Tests:  Recent Labs Lab 01/02/17 1342  AST 21  ALT 11*  ALKPHOS 35*  BILITOT 0.5  PROT 6.3*  ALBUMIN 3.5   No results for input(s): LIPASE, AMYLASE in the last 168 hours. No results for input(s): AMMONIA in the last 168 hours. CBC:  Recent Labs Lab 01/02/17 1342 01/02/17 2101 01/03/17 0936  WBC 6.7  --  6.8  NEUTROABS  --   --  4.8  HGB 6.5* 7.4* 8.6*  8.6*  HCT 21.4* 24.1* 28.1*  28.1*  MCV 89.2  --  87.8  PLT 236  --  214   Cardiac Enzymes: No results for input(s): CKTOTAL, CKMB, CKMBINDEX, TROPONINI in the last 168 hours. BNP: Invalid input(s): POCBNP CBG:  Recent Labs Lab 01/02/17 2230 01/03/17 0709 01/03/17 0852 01/03/17 1230  GLUCAP 121* 101* 117* 216*   D-Dimer No results for input(s): DDIMER in the last 72 hours. Hgb A1c No results for input(s): HGBA1C in the last 72 hours. Lipid Profile No results for input(s): CHOL, HDL, LDLCALC, TRIG, CHOLHDL, LDLDIRECT in the last 72 hours. Thyroid function studies No results for input(s): TSH, T4TOTAL, T3FREE, THYROIDAB in the last 72 hours.  Invalid input(s): FREET3 Anemia work up No results for input(s): VITAMINB12, FOLATE, FERRITIN, TIBC, IRON, RETICCTPCT in the last 72 hours. Urinalysis    Component Value Date/Time   COLORURINE AMBER BIOCHEMICALS MAY BE AFFECTED BY COLOR (A) 09/15/2008 0935   APPEARANCEUR CLOUDY (A) 09/15/2008 0935   LABSPEC 1.031 (H) 09/15/2008 0935   PHURINE 5.5 09/15/2008 0935   GLUCOSEU NEGATIVE 09/15/2008 0935   HGBUR NEGATIVE 09/15/2008 0935   BILIRUBINUR SMALL (A) 09/15/2008 0935   KETONESUR TRACE (A) 09/15/2008 0935   PROTEINUR NEGATIVE 09/15/2008 0935   UROBILINOGEN 0.2 09/15/2008 0935   NITRITE NEGATIVE 09/15/2008 0935   LEUKOCYTESUR  09/15/2008 0935    NEGATIVE MICROSCOPIC NOT DONE ON URINES WITH NEGATIVE PROTEIN, BLOOD, LEUKOCYTES, NITRITE, OR GLUCOSE <1000 mg/dL.   Sepsis Labs Invalid input(s): PROCALCITONIN,  WBC,  LACTICIDVEN Microbiology Recent  Results (from the past 240 hour(s))  MRSA PCR Screening     Status: None   Collection Time: 01/02/17  9:22 PM  Result Value Ref Range  Status   MRSA by PCR NEGATIVE NEGATIVE Final    Comment:        The GeneXpert MRSA Assay (FDA approved for NASAL specimens only), is one component of a comprehensive MRSA colonization surveillance program. It is not intended to diagnose MRSA infection nor to guide or monitor treatment for MRSA infections.      Time coordinating discharge: Over 30 minutes  SIGNED:   Elmarie Shiley, MD  [facility] 01/03/2017, 1:59 PM Pager   If 7PM-7AM, please contact night-coverage [email or web address] Password [email or web address]

## 2017-01-03 NOTE — Op Note (Signed)
[facility] Patient Name: Destiny [name] Procedure Date : 01/03/2017 MRN: [id number] Attending MD: Clarene Essex , MD Date of Birth: 08-01-1935 CSN: [id number] Age: 82 Admit Type: Inpatient Procedure:                Upper GI endoscopy Indications:              Iron deficiency anemia secondary to chronic blood                            loss Providers:                Clarene Essex, MD, Vista Lawman, RN, William Dalton,                            Technician Referring MD:              Medicines:                Propofol total dose 200 mg IV 50 mg lidocaine Complications:            No immediate complications. Estimated Blood Loss:     Estimated blood loss: none. Procedure:                Pre-Anesthesia Assessment:                           - Prior to the procedure, a History and Physical                            was performed, and patient medications and                            allergies were reviewed. The patient's tolerance of                            previous anesthesia was also reviewed. The risks                            and benefits of the procedure and the sedation                            options and risks were discussed with the patient.                            All questions were answered, and informed consent                            was obtained. Prior Anticoagulants: The patient has                            taken Coumadin (warfarin), last dose was 2 days                            prior to procedure. ASA Grade Assessment: II - A  patient with mild systemic disease. After reviewing                            the risks and benefits, the patient was deemed in                            satisfactory condition to undergo the procedure.                           After obtaining informed consent, the endoscope was                            passed under direct vision. Throughout the                            procedure, the  patient's blood pressure, pulse, and                            oxygen saturations were monitored continuously. The                            [id number]-[id number] ([id number]) scope was introduced through the                            mouth, and advanced to the third part of duodenum.                            The upper GI endoscopy was accomplished without                            difficulty. The patient tolerated the procedure                            well. Scope In: Scope Out: Findings:      A medium-sized hiatal hernia was present.      One non-bleeding superficial gastric ulcer with no stigmata of bleeding       was found in the gastric antrum. Biopsies were taken with a cold forceps       for histology.      The duodenal bulb, first portion of the duodenum, second portion of the       duodenum and third portion of the duodenum were normal.      The exam was otherwise without abnormality.no signs of active bleeding Impression:               - Medium-sized hiatal hernia.                           - Non-bleeding gastric ulcer with no stigmata of                            bleeding. Biopsied.                           - Normal duodenal bulb, first portion of the  duodenum, second portion of the duodenum and third                            portion of the duodenum.                           - The examination was otherwise normal. Moderate Sedation:      moderate sedation-none Recommendation:           - Patient has a contact number available for                            emergencies. The signs and symptoms of potential                            delayed complications were discussed with the                            patient. Return to normal activities tomorrow.                            Written discharge instructions were provided to the                            patient.                           - Soft diet today. if am. labs okay okay with to go                             home and haveout patient follow-up at time of                            colonoscopy                           - Continue present medications.                           - Await pathology results.                           - Return to GI clinic PRN.                           - Telephone GI clinic for pathology results in 1                            week.                           - Telephone GI clinic if symptomatic PRN.                           - Perform a colonoscopy at appointment to be  scheduled as an outpatieer patient per pt preference Procedure Code(s):        --- Professional ---                           [phone], Esophagogastroduodenoscopy, flexible,                            transoral; with biopsy, single or multiple Diagnosis Code(s):        --- Professional ---                           K44.9, Diaphragmatic hernia without obstruction or                            gangrene                           K25.9, Gastric ulcer, unspecified as acute or                            chronic, without hemorrhage or perforation                           D50.0, Iron deficiency anemia secondary to blood                            loss (chronic) CPT copyright 2016 American Medical Association. All rights reserved. The codes documented in this report are preliminary and upon coder review may  be revised to meet current compliance requirements. Clarene Essex, MD 01/03/2017 8:21:43 AM This report has been signed electronically. Number of Addenda: 0

## 2017-01-03 NOTE — Progress Notes (Signed)
ANTICOAGULATION CONSULT NOTE - Initial Consult  Pharmacy Consult for Warfarin Indication: atrial fibrillation  No Known Allergies  Patient Measurements: Height: 5' (152.4 cm) Weight: 167 lb (75.8 kg) IBW/kg (Calculated) : 45.5  Vital Signs: Temp: 97.3 F (36.3 C) (03/29 0858) Temp Source: Oral (03/29 0858) BP: 175/85 (03/29 0858) Pulse Rate: 119 (03/29 0858)  Labs:  Recent Labs  01/02/17 1342 01/02/17 2101 01/03/17 0936  HGB 6.5* 7.4* 8.6*  8.6*  HCT 21.4* 24.1* 28.1*  28.1*  PLT 236  --  214  LABPROT 23.7*  --   --   INR 2.08  --   --   CREATININE 1.92*  --   --     Estimated Creatinine Clearance: 21.3 mL/min (A) (by C-G formula based on SCr of 1.92 mg/dL (H)).   Medical History: Past Medical History:  Diagnosis Date  . Aortic calcification (HCC) 02/20/2016   Involving entire thoracic and abdominal aorta  . Atrial fibrillation ([place]) 1999-diagnosed  . Calculus of kidney   . Cataract    left eye (removed)  . Complication of anesthesia    slow to awaken  . Diabetes mellitus without mention of complication   . Diastolic CHF, chronic ([place]) 12/30/2014  . Edema   . Incidental pulmonary nodule    Results of CT scan:  Lungs/Pleura: There is some progressive scarring and reticulonodular opacity in the left upper lobe and lingula extending to the anterior pleural surface. There is some associated mild traction bronchiectasis in the lingula. Findings are suggestive of postinflammatory/postinfectious changes. Recommend correlation with any active infectious symptoms. CT follow-up would be appro  . Mitral valve insufficiency and aortic valve insufficiency   . Mononeuritis of unspecified site   . Osteoarthrosis, unspecified whether generalized or localized, unspecified site   . Pure hypercholesterolemia   . Unspecified essential hypertension   . Unspecified glaucoma(365.9)     Assessment:  32 YOF on warfarin PTA for Afib who presented on 3/28 with symptomatic anemia  and Hgb 6.5. INR 2.08 on admit - reversed with Vit K 10 mg x 1 and now down to 1.51. EGD was done 3/28 which showed a hiatal hernia and non-bleeding ulcer. Currents plans are for outpatient colonoscopy. Hgb up to 8.6 s/p 2 units PRBC. Pharmacy consulted to resume warfarin dosing.   PTA dose per patient report was Warfarin 5 mg daily EXCEPT for 2.5 mg on MWF. Admit INR therapeutic.  Goal of Therapy:  INR 2-3  Plan:  1. Warfarin 5 mg x 1 dose at 1800 today 2. Will continue to monitor for any signs/symptoms of bleeding and will follow up with PT/INR in the a.m.   Thank you for allowing pharmacy to be a part of this patient's care.  Alycia Rossetti, PharmD, BCPS Clinical Pharmacist Pager: [phone] Clinical phone for 01/03/2017 from 7a-3:30p: [phone] If after 3:30p, please call main pharmacy at: [phone] 01/03/2017 11:12 AM

## 2017-01-03 NOTE — Consult Note (Signed)
Advanced Heart Failure Team Consult Note  Referring Physician: Dr. Tyrell Antonio Primary Physician: Dr. Neta Mends Primary Cardiologist:  Dr. Aundra Dubin   Reason for Consultation: AKI -> resumption of cardiac meds  HPI:    Destiny [name] is a 82 y.o. female with PMH of HTN, DM type 2, Chronic Afib on Coumadin, Chronic systolic CHF due to NICM, Severe MR s/p failed MV clip, porcelain aorta, COPD, and at least moderate AS.   Last seen in HF clinic 11/02/16. Was volume overloaded so lasix increased for 3 days. Has had to repeat twice since that visit, most recently earlier this week.   Pt presented to ED 01/02/17 with weakness and dizziness x 1 week. INR 2.0 but Hgb  With significant drop at 6.5. FOBT +, though pt reported no dark stools. Other pertinent labs on admission include Cr 1.9 (up from 1.2-1.3 baseline), BUN 48, K 3.9. HF meds held with AKI and GI consulted.   Pt underwent EGD this am with a non-bleeding gastric ulcer with no stigmata of bleeding. Biopsy performed. + medium sized hiatal hernia. Otherwise normal.   Pt currently with no complaints and adamant that she is leaving today. She denies any bleeding that she has noted. Appetite OK. Has been a little more fatigued but otherwise feeling well after increase lasix a few days ago.    No SOB, orthopnea or PND. BP stable.   Review of Systems: [y] = yes, [ ]  = no   General: Weight gain [ ] ; Weight loss [ ] ; Anorexia [ ] ; Fatigue [ y]; Fever [ ] ; Chills [ ] ; Weakness Blue.Reese ]  Cardiac: Chest pain/pressure [ ] ; Resting SOB [ ] ; Exertional SOB [y]; Orthopnea [ ] ; Pedal Edema [ ] ; Palpitations [ ] ; Syncope [ ] ; Presyncope [ ] ; Paroxysmal nocturnal dyspnea[ ]   Pulmonary: Cough [ ] ; Wheezing[ ] ; Hemoptysis[ ] ; Sputum [ ] ; Snoring [ ]   GI: Vomiting[ ] ; Dysphagia[ ] ; Melena[ ] ; Hematochezia [ ] ; Heartburn[ ] ; Abdominal pain [ ] ; Constipation [ ] ; Diarrhea [ ] ; BRBPR [ ]   GU: Hematuria[ ] ; Dysuria [ ] ; Nocturia[ ]   Vascular: Pain in legs with  walking [ ] ; Pain in feet with lying flat [ ] ; Non-healing sores [ ] ; Stroke [ ] ; TIA [ ] ; Slurred speech [ ] ;  Neuro: Headaches[ ] ; Vertigo[ ] ; Seizures[ ] ; Paresthesias[ ] ;Blurred vision [ ] ; Diplopia [ ] ; Vision changes [ ]   Ortho/Skin: Arthritis [y]; Joint pain [y]; Muscle pain [ ] ; Joint swelling [ ] ; Back Pain [ ] ; Rash [ ]   Psych: Depression[ ] ; Anxiety[ ]   Heme: Bleeding problems [ ] ; Clotting disorders [ ] ; Anemia [y]  Endocrine: Diabetes [ ] ; Thyroid dysfunction[ ]   Home Medications Prior to Admission medications   Medication Sig Start Date End Date Taking? Authorizing Provider  atorvastatin (LIPITOR) 20 MG tablet Take 1 tablet (20 mg total) by mouth daily. 12/17/16  Yes Jolaine Artist, MD  Calcium Carbonate-Vit D-Min 600-400 MG-UNIT TABS Take 1 tablet by mouth daily.    Yes Historical Provider, MD  carvedilol (COREG) 25 MG tablet Take 0.5 tablets (12.5 mg total) by mouth 2 (two) times daily. 05/23/16 11/02/17 Yes Larey Dresser, MD  Cinnamon 500 MG capsule Take 500 mg by mouth daily.     Yes Historical Provider, MD  dorzolamide-timolol (COSOPT) 22.3-6.8 MG/ML ophthalmic solution Place 1 drop into both eyes 2 (two) times daily.    Yes Historical Provider, MD  ferrous sulfate 325 (65 FE) MG tablet Take 325 mg  by mouth daily with breakfast.   Yes Historical Provider, MD  furosemide (LASIX) 80 MG tablet Take 1 tablet (80 mg total) by mouth 2 (two) times daily. 05/02/16  Yes Larey Dresser, MD  gabapentin (NEURONTIN) 300 MG capsule Take 300 mg by mouth 3 (three) times daily.   Yes Historical Provider, MD  glipiZIDE (GLUCOTROL XL) 10 MG 24 hr tablet Take 1 tablet (10 mg total) by mouth daily. 10/24/15  Yes Renee A Kuneff, DO  latanoprost (XALATAN) 0.005 % ophthalmic solution Place 1 drop into both eyes at bedtime.    Yes Historical Provider, MD  magnesium oxide (MAG-OX) 400 MG tablet Take 400 mg by mouth daily.   Yes Historical Provider, MD  metFORMIN (GLUCOPHAGE-XR) 500 MG 24 hr tablet  Take 2 tablets (1,000 mg total) by mouth daily with breakfast. 12/07/15  Yes Renee A Kuneff, DO  Multiple Vitamins-Minerals (MULTIVITAL) tablet Take 1 tablet by mouth daily.     Yes Historical Provider, MD  Omega-3 Fatty Acids (FISH OIL) 1000 MG CAPS Take 1 capsule by mouth daily.    Yes Historical Provider, MD  sacubitril-valsartan (ENTRESTO) 24-26 MG Take 1 tablet by mouth 2 (two) times daily. 11/05/16  Yes Larey Dresser, MD  spironolactone (ALDACTONE) 25 MG tablet Take 0.5 tablets (12.5 mg total) by mouth daily. 11/05/16  Yes Larey Dresser, MD  tiotropium (SPIRIVA HANDIHALER) 18 MCG inhalation capsule Place 1 capsule (18 mcg total) into inhaler and inhale daily. 03/01/16  Yes Larey Dresser, MD  vitamin E 400 UNIT capsule Take 400 Units by mouth daily.     Yes Historical Provider, MD  warfarin (COUMADIN) 5 MG tablet Take 1 tablet (5 mg total) by mouth daily. Takes 2.5mg  on Monday, and 5mg  all other days. Patient taking differently: Take 5 mg by mouth daily. Takes 2.5mg  on Monday,wednesday and friday and 5mg  all other days. 10/24/15  Yes Renee A Kuneff, DO  glucose blood (ACCU-CHEK AVIVA PLUS) test strip Use as instructed 10/28/15   Ma Hillock, DO    Past Medical History: Past Medical History:  Diagnosis Date  . Aortic calcification (HCC) 02/20/2016   Involving entire thoracic and abdominal aorta  . Atrial fibrillation ([place]) 1999-diagnosed  . Calculus of kidney   . Cataract    left eye (removed)  . Complication of anesthesia    slow to awaken  . Diabetes mellitus without mention of complication   . Diastolic CHF, chronic ([place]) 12/30/2014  . Edema   . Incidental pulmonary nodule    Results of CT scan:  Lungs/Pleura: There is some progressive scarring and reticulonodular opacity in the left upper lobe and lingula extending to the anterior pleural surface. There is some associated mild traction bronchiectasis in the lingula. Findings are suggestive of postinflammatory/postinfectious  changes. Recommend correlation with any active infectious symptoms. CT follow-up would be appro  . Mitral valve insufficiency and aortic valve insufficiency   . Mononeuritis of unspecified site   . Osteoarthrosis, unspecified whether generalized or localized, unspecified site   . Pure hypercholesterolemia   . Unspecified essential hypertension   . Unspecified glaucoma(365.9)     Past Surgical History: Past Surgical History:  Procedure Laterality Date  . ABDOMINAL HYSTERECTOMY  2000   partial  . APPENDECTOMY  1954  . CARDIAC CATHETERIZATION N/A 11/16/2015   Procedure: Right/Left Heart Cath and Coronary Angiography;  Surgeon: Larey Dresser, MD;  Location: [facility];  Service: Cardiovascular;  Laterality: N/A;  . catarct surgery Left   .  CYSTOSCOPY W/ RETROGRADES Right 11/26/2012   Procedure: CYSTOSCOPY WITH RETROGRADE PYELOGRAM;  Surgeon: Alexis Frock, MD;  Location[facility];  Service: Urology;  Laterality: Right;  . HOLMIUM LASER APPLICATION Right 5/57/3220   Procedure: HOLMIUM LASER APPLICATION;  Surgeon: Alexis Frock, MD;  Location: [facility];  Service: Urology;  Laterality: Right;  . JOINT REPLACEMENT Right 2009   knee  . kidney stone     ? Dr Tammi Klippel  . TEE WITHOUT CARDIOVERSION N/A 12/08/2015   Procedure: TRANSESOPHAGEAL ECHOCARDIOGRAM (TEE);  Surgeon: Larey Dresser, MD;  Location: [facility];  Service: Cardiovascular;  Laterality: N/A;  . TONSILLECTOMY  1943  . TRANSESOPHAGEAL ECHOCARDIOGRAM  02/2012    Family History: Family History  Problem Relation Age of Onset  . Cancer Mother 8    leukemia   . Breast cancer Neg Hx   . Colon cancer Neg Hx     Social History: Social History   Social History  . Marital status: Widowed    Spouse name: N/A  . Number of children: N/A  . Years of education: N/A   Social History Main Topics  . Smoking status: Former Smoker    Quit date: 07/11/1988  . Smokeless tobacco: Never Used    . Alcohol use Yes     Comment: occasional  . Drug use: No  . Sexual activity: No   Other Topics Concern  . None   Social History Narrative   Widow. Lives alone. Attended business college.   Lives in one story home. Has living will.    Walker and/or four pronged cane for assistance.    Former smoker of 29 years. Quit smoking 1989.   Patient drinks caffeinated beverages, takes a daily vitamin.   Wears her seatbelt, wears dentures, smoke detector located in the home.       Allergies:  No Known Allergies  Objective:    Vital Signs:   Temp:  [97.3 F (36.3 C)-98.5 F (36.9 C)] 97.3 F (36.3 C) (03/29 0858) Pulse Rate:  [71-119] 119 (03/29 0858) Resp:  [11-23] 22 (03/29 0858) BP: (83-175)/(49-144) 175/85 (03/29 0858) SpO2:  [91 %-100 %] 91 % (03/29 0858) Weight:  [157 lb (71.2 kg)-167 lb 1.7 oz (75.8 kg)] 167 lb (75.8 kg) (03/29 0710) Last BM Date: 01/02/17  Weight change: Filed Weights   01/02/17 1320 01/02/17 2035 01/03/17 0710  Weight: 157 lb (71.2 kg) 167 lb 1.7 oz (75.8 kg) 167 lb (75.8 kg)    Intake/Output:   Intake/Output Summary (Last 24 hours) at 01/03/17 1130 Last data filed at 01/03/17 0813  Gross per 24 hour  Intake             1440 ml  Output              800 ml  Net              640 ml     Physical Exam: General:  NAD. Sitting in chair  HEENT: Normal anicteric. Sclerae mildly pale Neck: supple. JVP 8 cm.Carotids 2+ bilat; no bruits. No lymphadenopathy or thyromegaly appreciated. Cor: PMI nondisplaced. IRR, IRR  3/6 HSM at apex.  Lungs: Mildly diminished basilar sounds. No wheezing  Abdomen: soft, nontender, nondistended. No hepatosplenomegaly. No bruits or masses. Good BS Extremities: no cyanosis, clubbing, rash. Minimal ankle edema  Neuro: alert & orientedx3, cranial nerves grossly intact. moves all 4 extremities w/o difficulty. Affect pleasant  Telemetry: AF 90s, Personally reviewed   Labs: Basic Metabolic Panel:  Recent Labs Lab  01/02/17 1342 01/03/17 0936  NA 139 141  K 3.9 4.2  CL 101 105  CO2 27 28  GLUCOSE 213* 178*  BUN 48* 38*  CREATININE 1.92* 1.67*  CALCIUM 9.3 9.2    Liver Function Tests:  Recent Labs Lab 01/02/17 1342  AST 21  ALT 11*  ALKPHOS 35*  BILITOT 0.5  PROT 6.3*  ALBUMIN 3.5   No results for input(s): LIPASE, AMYLASE in the last 168 hours. No results for input(s): AMMONIA in the last 168 hours.  CBC:  Recent Labs Lab 01/02/17 1342 01/02/17 2101 01/03/17 0936  WBC 6.7  --  6.8  NEUTROABS  --   --  4.8  HGB 6.5* 7.4* 8.6*  8.6*  HCT 21.4* 24.1* 28.1*  28.1*  MCV 89.2  --  87.8  PLT 236  --  214    Cardiac Enzymes: No results for input(s): CKTOTAL, CKMB, CKMBINDEX, TROPONINI in the last 168 hours.  BNP: BNP (last 3 results)  Recent Labs  05/02/16 1103 05/23/16 1530 11/02/16 1228  BNP 288.0* 250.7* 324.3*    ProBNP (last 3 results) No results for input(s): PROBNP in the last 8760 hours.   CBG:  Recent Labs Lab 01/02/17 2230 01/03/17 0709 01/03/17 0852  GLUCAP 121* 101* 117*    Coagulation Studies:  Recent Labs  01/02/17 1342 01/03/17 0936  LABPROT 23.7* 18.4*  INR 2.08 1.51    Other results: EKG: Afib 85 bpm  Imaging:  No results found.   Medications:     Current Medications: . sodium chloride  10 mL/hr Intravenous Once  . atorvastatin  20 mg Oral q1800  . calcium-vitamin D  1 tablet Oral Q breakfast  . carvedilol  12.5 mg Oral BID  . dorzolamide-timolol  1 drop Both Eyes BID  . ferrous sulfate  325 mg Oral Q breakfast  . insulin aspart  0-5 Units Subcutaneous QHS  . insulin aspart  0-9 Units Subcutaneous TID WC  . latanoprost  1 drop Both Eyes QHS  . magnesium oxide  400 mg Oral Daily  . multivitamin with minerals  1 tablet Oral Daily  . [START ON 01/06/2017] pantoprazole  40 mg Intravenous Q12H  . sodium chloride flush  3 mL Intravenous Q12H  . tiotropium  18 mcg Inhalation Daily  . vitamin E  400 Units Oral Daily      Infusions: . sodium chloride    . pantoprozole (PROTONIX) infusion 8 mg/hr (01/03/17 0952)      Assessment/Plan   1. Symptomatic IDA  2. Chronic systolic CHF with recovered EF  Echo 12/27/16 LVEF 60-65% 3. Atrial fibrillation: Chronic 4. Pulmonary hypertension 5. Mitral regurgitation 6. COPD 7. PUD with non bleeding ulcer  Unclear source of her anemia.  Iron stores low 11/23/16. Was previously scheduled for EGD/Colon next month.  Will need GI follow up. She is a Financial controller Patient.   EGD this admission with non-bleeding ulcer and otherwise normal (hiatal hernia also noted)  INR 1.5 mg this am.   Per pharm will give 5 mg tonight and pt should resume home regimen tomorrow.  Will have PCP visit early next week to have INR checked.   Stable from HF standpoint. Creatinine 1.6 and trending down. Baseline 1.2-1.4. Suspect elevation multifactorial with recent extra lasix and anemia.  Will have her resume Entresto and Lasix as previously ordered this evening.   She has follow up in HF clinic. Will repeat CBC and BMET at that visit.  HF meds for home Lasix 80 mg BID Spiro 12.5 mg daily Entresto 24/26 mg BID Coreg 12.5 mg BID Atorvastatin 20 mg daily  Length of Stay: 1  Shirley Friar, PA-C  01/03/2017, 11:30 AM  Advanced Heart Failure Team Pager [phone] (M-F; 7a - 4p)  Please contact [facility] for night-coverage after hours (4p -7a ) and weekends on [email or web address]  Patient seen and examined with Oda Kilts, PA-C. We discussed all aspects of the encounter. I agree with the assessment and plan as stated above.   Volume status looks good. Continue current HF meds.  Results of EGD noted. Source of bleeding still seems to be a bit unclear. Feels better after transfusion and is adamant about going home. Will need f/u with GI for colonoscopy. Continue warfarin for now. Consider NOAC at some point.   Glori Bickers, MD  12:58 PM

## 2017-01-03 NOTE — Anesthesia Postprocedure Evaluation (Addendum)
Anesthesia Post Note  Patient: Destiny [name]  Procedure(s) Performed: Procedure(s) (LRB): ESOPHAGOGASTRODUODENOSCOPY (EGD) WITH PROPOFOL (N/A)  Patient location during evaluation: PACU Anesthesia Type: MAC Level of consciousness: awake and alert Pain management: pain level controlled Vital Signs Assessment: post-procedure vital signs reviewed and stable Respiratory status: spontaneous breathing and respiratory function stable Cardiovascular status: stable Anesthetic complications: no       Last Vitals:  Vitals:   01/03/17 0819 01/03/17 0830  BP: (!) 94/49 107/68  Pulse: 89 90  Resp: 14 15  Temp: 36.5 C     Last Pain:  Vitals:   01/03/17 0819  TempSrc: Oral  PainSc:                  Mackensie Pilson DANIEL

## 2017-01-04 ENCOUNTER — Encounter (HOSPITAL_COMMUNITY): Payer: Self-pay | Admitting: Gastroenterology

## 2017-01-04 LAB — HEMOGLOBIN A1C
Hgb A1c MFr Bld: 6 % — ABNORMAL HIGH (ref 4.8–5.6)
Mean Plasma Glucose: 126 mg/dL

## 2017-01-05 LAB — URINE CULTURE

## 2017-01-09 ENCOUNTER — Ambulatory Visit (HOSPITAL_COMMUNITY)
Admission: RE | Admit: 2017-01-09 | Discharge: 2017-01-09 | Disposition: A | Discharge status: Home / Self Care | Payer: Medicare HMO | Source: Ambulatory Visit | Attending: Internal Medicine | Admitting: Internal Medicine

## 2017-01-09 VITALS — BP 96/50 | HR 84 | Wt 160.8 lb

## 2017-01-09 DIAGNOSIS — Z7901 Long term (current) use of anticoagulants: Secondary | ICD-10-CM | POA: Diagnosis not present

## 2017-01-09 DIAGNOSIS — J449 Chronic obstructive pulmonary disease, unspecified: Secondary | ICD-10-CM | POA: Diagnosis not present

## 2017-01-09 DIAGNOSIS — I340 Nonrheumatic mitral (valve) insufficiency: Secondary | ICD-10-CM | POA: Insufficient documentation

## 2017-01-09 DIAGNOSIS — I482 Chronic atrial fibrillation, unspecified: Secondary | ICD-10-CM

## 2017-01-09 DIAGNOSIS — I272 Pulmonary hypertension, unspecified: Secondary | ICD-10-CM | POA: Diagnosis not present

## 2017-01-09 DIAGNOSIS — I080 Rheumatic disorders of both mitral and aortic valves: Secondary | ICD-10-CM

## 2017-01-09 DIAGNOSIS — I5022 Chronic systolic (congestive) heart failure: Secondary | ICD-10-CM | POA: Diagnosis not present

## 2017-01-09 DIAGNOSIS — I5042 Chronic combined systolic (congestive) and diastolic (congestive) heart failure: Secondary | ICD-10-CM

## 2017-01-09 DIAGNOSIS — D649 Anemia, unspecified: Secondary | ICD-10-CM | POA: Diagnosis not present

## 2017-01-09 DIAGNOSIS — I429 Cardiomyopathy, unspecified: Secondary | ICD-10-CM | POA: Diagnosis not present

## 2017-01-09 DIAGNOSIS — I110 Hypertensive heart disease with heart failure: Secondary | ICD-10-CM | POA: Diagnosis present

## 2017-01-09 DIAGNOSIS — I350 Nonrheumatic aortic (valve) stenosis: Secondary | ICD-10-CM | POA: Diagnosis not present

## 2017-01-09 DIAGNOSIS — I10 Essential (primary) hypertension: Secondary | ICD-10-CM | POA: Diagnosis not present

## 2017-01-09 NOTE — Progress Notes (Signed)
Patient ID: Destiny [name], female   DOB: 1934/10/10, 82 y.o.   MRN: [id number] PCP: Dr Neta Mends Cardiology: Dr. Johnsie Cancel HF Cardiology: Dr. Ralph Leyden is a 82 y.o. female  with PMH of HTN, DM type 2, Chronic Afib on Coumadin, Chronic systolic CHF due to NICM, Severe MR s/p failed MV clip, porcelain aorta, COPD, and at least moderate AS.   Given increased dyspnea, echo was done in 3/16 which showed EF 30-35%, newly decreased.  Pt had [facility] 11/2015 with no significant CAD and normal R/L filling pressures. Mild PAH. Echo 11/2015 LVEF 40-45% with Mod/severe aortic stenosis.   TEE in 3/17 with EF 45% with possible low gradient at least moderate AS and possible anterior mitral leaflet chordal rupture with concern for very eccentric, severe mitral regurgitation.  Saw Dr. Roxy Manns for cardiac surgery evaluation.  Initial plan was repair of mitral valve and replacement of aortic valve.  However, she was found on CTA chest to have a porcelain aorta and was thought to be too high risk for open heart surgery.    Pt has been seen at [facility] and mitral valve clip was attempted.  This failed due to significant calcification of the valve and apparatus.    Pt admitted 3/28 -> 01/03/17 with weakness and dizziness and found to have Hgb 6.5 with FOBT +. Pt underwent EGD with non-bleeding gastric ulcer. Biopsy performed.  Follow up with GI made.  HF team consulted for resumption of her HF meds with AKI. Pt was adamant that she could not stay in the hospital longer so close follow up made.   Pt presents today for post hospital follow up. Feeling back to baseline overall.  Denies DOE on flat ground. No BRBPR or melena. Denies SOB, lightheadedness, dizziness, CP, or palpitations. No N/V, diarrhea, or Abdominal pain.  Denies peripheral edema. Taking all medications as directed. Taking lasix 80 mg BID and has not needed any extra since the week prior to her admission. She has not started exercising again as has  persistent fatigue, but plans to start soon. She has GI appt this am at 1030.  Review of systems complete and found to be negative unless listed in HPI.    Labs (1/17): K 3.9, creatinine 0.63, BNP 198 Labs (2/17): K 4.4, creatinine 0.69 Labs (3/17): K 4.8, creatinine 0.78 Labs (5/17): K 4.1, creatinine 0.88, BNP 259 Labs (8/17): K 4.2, creatinine 1.2 => 1.19, BNP 251 Labs (10/17): K 4.3, creatinine 1.22, hgb 11.8  PMH: 1. HTN 2. DM type II 3. Chronic atrial fibrillation 4. Right TKR 12/10.  5. Chronic systolic CHF:  - Remote cath showed no significant CAD> - Echo (3/15) with EF 60-65%.  - Echo (3/16) with EF 30-35%, mild LVH, mild aortic stenosis mean gradient 13 mmHg, mild MR, RV mildly dilated with normal systolic function, moderate TR.  - LHC/RHC (2/17): minimal luminal irregularities in coronaries.  Mean RA 4, PA 57/22, PCWP mean 15, PVR 2.7 WU, CI 3.29. Peak-to-peak aortic valve gradient 21 mmHg.  - Echo (2/17) with EF 40-45%, moderate LV hypertrophy, mild MR, normal RV size and systolic function, PASP 45 mmHg, moderate TR, moderate-severe aortic stenosis with mean gradient 30 mmHg, peak 78 mmHg, AVA 0.8 cm^2.  - TEE (3/17) with EF 45%, severely calcified aortic valve with mean gradient 16 mmHg but AVA 0.7 cm^2 (aortic gradient likely underestimated based on 2/17 TTE => ?low gradient severe AS versus pseudostenosis), possible anterior mitral leaflet chordal rupture with very eccentric,  posteriorly-directed mitral regurgitation (Coanda effect).   6. Aortic stenosis: Low gradient at least moderate AS.   7. Mitral regurgitation: Appears severe by TEE with possible chordal rupture.  MV clip attempted at [facility], failed.  8. Porcelain aorta. 9. COPD: PFTs (4/17) with FVC 55%, FEV1 49%, radio 89%, DLCO 50% => moderate to severe obstructive lung disease.   FH: Atrial fibrillation.  No CAD.  SH: Retired, widow, lives in [place], quit smoking 1989, originally from [place], has 7 kids.     Current Outpatient Prescriptions  Medication Sig Dispense Refill  . acetaminophen (TYLENOL) 325 MG tablet Take 2 tablets (650 mg total) by mouth every 6 (six) hours as needed for mild pain (or Fever >/= 101). 30 tablet 0  . atorvastatin (LIPITOR) 20 MG tablet Take 1 tablet (20 mg total) by mouth daily. 90 tablet 3  . Calcium Carbonate-Vit D-Min 600-400 MG-UNIT TABS Take 1 tablet by mouth daily.     . carvedilol (COREG) 25 MG tablet Take 0.5 tablets (12.5 mg total) by mouth 2 (two) times daily. 60 tablet 6  . Cinnamon 500 MG capsule Take 500 mg by mouth daily.      . dorzolamide-timolol (COSOPT) 22.3-6.8 MG/ML ophthalmic solution Place 1 drop into both eyes 2 (two) times daily.     . ferrous sulfate 325 (65 FE) MG tablet Take 325 mg by mouth daily with breakfast.    . furosemide (LASIX) 80 MG tablet Take 1 tablet (80 mg total) by mouth 2 (two) times daily. 60 tablet 6  . gabapentin (NEURONTIN) 300 MG capsule Take 300 mg by mouth 3 (three) times daily.    Marland Kitchen glipiZIDE (GLUCOTROL XL) 10 MG 24 hr tablet Take 1 tablet (10 mg total) by mouth daily. 90 tablet 1  . glucose blood (ACCU-CHEK AVIVA PLUS) test strip Use as instructed 100 each 11  . latanoprost (XALATAN) 0.005 % ophthalmic solution Place 1 drop into both eyes at bedtime.     Marland Kitchen linagliptin (TRADJENTA) 5 MG TABS tablet Take 5 mg by mouth daily.    . magnesium oxide (MAG-OX) 400 MG tablet Take 400 mg by mouth daily.    . metFORMIN (GLUCOPHAGE) 500 MG tablet Take 250 mg by mouth 2 (two) times daily with a meal.    . Multiple Vitamins-Minerals (MULTIVITAL) tablet Take 1 tablet by mouth daily.      . Omega-3 Fatty Acids (FISH OIL) 1000 MG CAPS Take 1 capsule by mouth daily.     . pantoprazole (PROTONIX) 40 MG tablet Take 1 tablet (40 mg total) by mouth daily. 30 tablet 0  . sacubitril-valsartan (ENTRESTO) 24-26 MG Take 1 tablet by mouth 2 (two) times daily. 60 tablet 11  . spironolactone (ALDACTONE) 25 MG tablet Take 0.5 tablets (12.5 mg  total) by mouth daily. 15 tablet 3  . tiotropium (SPIRIVA HANDIHALER) 18 MCG inhalation capsule Place 1 capsule (18 mcg total) into inhaler and inhale daily. 30 capsule 3  . vitamin E 400 UNIT capsule Take 400 Units by mouth daily.      Marland Kitchen warfarin (COUMADIN) 5 MG tablet Take 1 tablet (5 mg total) by mouth daily. Takes 2.5mg  on Monday,wednesday and friday and 5mg  all other days. 30 tablet 0   No current facility-administered medications for this encounter.    BP (!) 96/50   Pulse 84   Wt 160 lb 12.8 oz (72.9 kg)   SpO2 92%   BMI 31.40 kg/m    Wt Readings from Last 3 Encounters:  01/09/17 160 lb 12.8 oz (72.9 kg)  01/03/17 167 lb (75.8 kg)  11/23/16 161 lb (73 kg)   General: Elderly, fatigued apeparing. NAD. No resp difficulty Psych: Flat affect, but appropriate. HEENT: Normal, without mass or lesion. Neck: Supple, no bruits or JVD. Carotids 2+. No lymphadenopathy/thyromegaly appreciated. Heart: Non-displaced PMI. Irr Irr. 3/6 systolic crescendo-decrescendo murmur RUSB with clear S2, 3/6 HSM at apex.  Lungs: Resp regular and unlabored, CTA. Abdomen: Soft, non-tender, non-distended, No HSM, BS + x 4.  Extremities: No clubbing or cyanosis. DP/PT/Radials 2+ and equal bilaterally. No peripheral edema.  Neuro: Alert and oriented X 3. Moves all extremities spontaneously.   Assessment/Plan: 1. Chronic systolic CHF: Echo (3/54) with EF 30-35%, diffuse hypokinesis, improved to 45% on 3/17 TEE.  Nonischemic cardiomyopathy, no significant coronary disease on angiography.  This could be a valvular cardiomyopathy with both aortic valve and mitral valve disease.  - NYHA Class II symptoms.  - Volume status stable on exam. Continue lasix 80 mg BID with extra as needed.  - Recent labs stable.   - Continue Coreg 25 mg bid.  - Continue Entresto 24/26 bid.   - Continue spironolactone 12.5 mg daily. - Reinforced fluid restriction to <2 L daily, sodium restriction to less than 2000 mg daily,  and the importance of daily weights.  - No room to up-titrate meds today with soft BP.   2. Atrial fibrillation: Chronic.   - Rate controlled.  - Continue coumadin per PCP  - Continue coreg as above.  3. Aortic stenosis: On 2/17 echo, severe aortic stenosis by AVA, moderate by mean gradient.  By cath, suspect moderate AS given relative ease of crossing valve.  TEE showed low gradient probably moderate aortic stenosis.  - Moderate, stable AS on Echo 12/27/16 4. Pulmonary hypertension: Mild to moderate pulmonary hypertension on RHC but PVR not significantly elevated.  - Sleep study would be reasonable but she prefers to defer at this time.  5. Mitral regurgitation: Suspect rupture of an anterior leaflet chord with very eccentric mitral regurgitation.  MR is severe. - Has been seen for surgery eval for MV repair and AoV replacement.  However, given porcelain aorta, she was deemed not a candidate for open heart surgery.  - Sent for sent her for percutaneous MV clipping.  This was attempted by Dr Bridgette Habermann at [facility] but procedure failed due to the pattern of MV calcification.  Unfortunately, we do not have good options for treatment of her valvular disease at this point beyond medical management.   - No change. Of note, officially read as trivial MR on Echo 12/27/16. Dr. Aundra Dubin to review.  6. COPD: Moderate to severe by recent PFTs. - Continue Spiriva. No change to current medications.   7. Anemia - With relatively unremarkable EGD as above.  - To see [name] today. Needs colonoscopy.   She is overall stable following recent hospitalization. She has GI appointment this morning so will not check Hgb in our clinic. She will keep 2 week follow up with Dr. Aundra Dubin.   Shirley Friar, PA-C  01/09/2017

## 2017-01-09 NOTE — Patient Instructions (Signed)
Follow up appointment already made for 2 weeks. No further changes at this time.

## 2017-01-09 NOTE — Progress Notes (Signed)
Advanced Heart Failure Medication Review by a Pharmacist  Does the patient  feel that his/her medications are working for him/her?  yes  Has the patient been experiencing any side effects to the medications prescribed?  no  Does the patient measure his/her own blood pressure or blood glucose at home?  yes   Does the patient have any problems obtaining medications due to transportation or finances?   No - [employer] + [employer]  Understanding of regimen: good Understanding of indications: good Potential of compliance: good Patient understands to avoid NSAIDs. Patient understands to avoid decongestants.  Issues to address at subsequent visits: None   Pharmacist comments: Destiny [name] is a pleasant 82 yo F presenting with her medication list. She reports good compliance with her regimen and did not have any specific medication-related questions or concerns for me at this time.   [name]. Velva Harman, PharmD, BCPS, CPP Clinical Pharmacist Pager: [phone] Phone: [phone] 01/09/2017 10:18 AM      Time with patient: 10 minutes Preparation and documentation time: 2 minutes Total time: 12 minutes

## 2017-01-10 ENCOUNTER — Other Ambulatory Visit (HOSPITAL_COMMUNITY): Payer: Self-pay | Admitting: Cardiology

## 2017-01-24 ENCOUNTER — Encounter: Payer: Medicare HMO | Admitting: Internal Medicine

## 2017-01-25 ENCOUNTER — Ambulatory Visit (HOSPITAL_COMMUNITY)
Admission: RE | Admit: 2017-01-25 | Discharge: 2017-01-25 | Disposition: A | Discharge status: Home / Self Care | Payer: Medicare HMO | Source: Ambulatory Visit | Attending: Cardiology | Admitting: Cardiology

## 2017-01-25 VITALS — BP 102/58 | HR 57 | Wt 157.1 lb

## 2017-01-25 DIAGNOSIS — J449 Chronic obstructive pulmonary disease, unspecified: Secondary | ICD-10-CM | POA: Insufficient documentation

## 2017-01-25 DIAGNOSIS — D649 Anemia, unspecified: Secondary | ICD-10-CM | POA: Diagnosis not present

## 2017-01-25 DIAGNOSIS — I5022 Chronic systolic (congestive) heart failure: Secondary | ICD-10-CM | POA: Diagnosis present

## 2017-01-25 DIAGNOSIS — I340 Nonrheumatic mitral (valve) insufficiency: Secondary | ICD-10-CM | POA: Diagnosis not present

## 2017-01-25 DIAGNOSIS — I110 Hypertensive heart disease with heart failure: Secondary | ICD-10-CM | POA: Diagnosis present

## 2017-01-25 DIAGNOSIS — Z87891 Personal history of nicotine dependence: Secondary | ICD-10-CM | POA: Insufficient documentation

## 2017-01-25 DIAGNOSIS — I482 Chronic atrial fibrillation, unspecified: Secondary | ICD-10-CM

## 2017-01-25 DIAGNOSIS — I5042 Chronic combined systolic (congestive) and diastolic (congestive) heart failure: Secondary | ICD-10-CM | POA: Diagnosis not present

## 2017-01-25 DIAGNOSIS — I272 Pulmonary hypertension, unspecified: Secondary | ICD-10-CM | POA: Diagnosis not present

## 2017-01-25 DIAGNOSIS — Z7901 Long term (current) use of anticoagulants: Secondary | ICD-10-CM | POA: Insufficient documentation

## 2017-01-25 DIAGNOSIS — Z7984 Long term (current) use of oral hypoglycemic drugs: Secondary | ICD-10-CM | POA: Insufficient documentation

## 2017-01-25 DIAGNOSIS — I080 Rheumatic disorders of both mitral and aortic valves: Secondary | ICD-10-CM | POA: Diagnosis not present

## 2017-01-25 DIAGNOSIS — I350 Nonrheumatic aortic (valve) stenosis: Secondary | ICD-10-CM | POA: Insufficient documentation

## 2017-01-25 DIAGNOSIS — Z79899 Other long term (current) drug therapy: Secondary | ICD-10-CM | POA: Diagnosis not present

## 2017-01-25 DIAGNOSIS — I429 Cardiomyopathy, unspecified: Secondary | ICD-10-CM | POA: Insufficient documentation

## 2017-01-25 DIAGNOSIS — Z8719 Personal history of other diseases of the digestive system: Secondary | ICD-10-CM | POA: Diagnosis not present

## 2017-01-25 DIAGNOSIS — E119 Type 2 diabetes mellitus without complications: Secondary | ICD-10-CM | POA: Insufficient documentation

## 2017-01-25 LAB — CBC
HCT: 32.5 % — ABNORMAL LOW (ref 36.0–46.0)
Hemoglobin: 9.8 g/dL — ABNORMAL LOW (ref 12.0–15.0)
MCH: 27.7 pg (ref 26.0–34.0)
MCHC: 30.2 g/dL (ref 30.0–36.0)
MCV: 91.8 fL (ref 78.0–100.0)
PLATELETS: 202 10*3/uL (ref 150–400)
RBC: 3.54 MIL/uL — AB (ref 3.87–5.11)
RDW: 18.7 % — ABNORMAL HIGH (ref 11.5–15.5)
WBC: 6.3 10*3/uL (ref 4.0–10.5)

## 2017-01-25 LAB — BASIC METABOLIC PANEL
Anion gap: 8 (ref 5–15)
BUN: 26 mg/dL — AB (ref 6–20)
CALCIUM: 9.3 mg/dL (ref 8.9–10.3)
CO2: 29 mmol/L (ref 22–32)
CREATININE: 1.19 mg/dL — AB (ref 0.44–1.00)
Chloride: 102 mmol/L (ref 101–111)
GFR calc non Af Amer: 42 mL/min — ABNORMAL LOW (ref >60)
GFR, EST AFRICAN AMERICAN: 49 mL/min — AB (ref >60)
GLUCOSE: 293 mg/dL — AB (ref 65–99)
Potassium: 4.3 mmol/L (ref 3.5–5.1)
Sodium: 139 mmol/L (ref 135–145)

## 2017-01-25 NOTE — Patient Instructions (Signed)
Labs today We will only contact you if something comes back abnormal or we need to make some changes. Otherwise no news is good news!  Your physician recommends that you schedule a follow-up appointment in: 3-4 months with Dr. McLean  

## 2017-01-27 NOTE — Progress Notes (Signed)
Patient ID: Destiny [name], female   DOB: [birth year], 82 y.o.   MRN: [id number] PCP: Dr Neta Mends Cardiology: Dr. Johnsie Cancel HF Cardiology: Dr. Ralph Leyden is an 82 y.o. female  with PMH of HTN, DM type 2, Chronic Afib on Coumadin, Chronic systolic CHF due to NICM, Severe MR s/p failed MV clip, porcelain aorta, COPD, and at least moderate AS.   Given increased dyspnea, echo was done in 3/16 which showed EF 30-35%, newly decreased.  Pt had [facility] 11/2015 with no significant CAD and normal R/L filling pressures. Mild PAH. Echo 11/2015 LVEF 40-45% with Mod/severe aortic stenosis.   TEE in 3/17 with EF 45% with possible low gradient at least moderate AS and possible anterior mitral leaflet chordal rupture with concern for very eccentric, severe mitral regurgitation.  Saw Dr. Roxy Manns for cardiac surgery evaluation.  Initial plan was repair of mitral valve and replacement of aortic valve.  However, she was found on CTA chest to have a porcelain aorta and was thought to be too high risk for open heart surgery.    Pt has been seen at [facility] and mitral valve clip was attempted.  This failed due to significant calcification of the valve and apparatus.    Pt admitted 3/28 -> 01/03/17 with weakness and dizziness and found to have Hgb 6.5 with FOBT +. Pt underwent EGD with non-bleeding gastric ulcer. Biopsy performed.  Follow up with GI made.  HF team consulted for resumption of her HF meds with AKI.   Echo was done in 3/18 with EF up to 60-65%, moderate AS, and only trivial MR noted.    She seems to be doing well overall.  No BRBPR but sometimes stool looks dark.  She has a colonoscopy scheduled for 6/18.  She denies significant fatigue.  She is short of breath vacuuming and walking up stairs. No dyspnea on flat ground. Weight is down 3 lbs.  No chest pain.  Review of systems complete and found to be negative unless listed in HPI.    Labs (1/17): K 3.9, creatinine 0.63, BNP 198 Labs (2/17): K 4.4,  creatinine 0.69 Labs (3/17): K 4.8, creatinine 0.78 Labs (5/17): K 4.1, creatinine 0.88, BNP 259 Labs (8/17): K 4.2, creatinine 1.2 => 1.19, BNP 251 Labs (10/17): K 4.3, creatinine 1.22, hgb 11.8 Labs (3/18): K 4.2, creatinine 1.67, hgb 8.6  PMH: 1. HTN 2. DM type II 3. Chronic atrial fibrillation 4. Right TKR 12/10.  5. Chronic systolic CHF:  - Remote cath showed no significant CAD> - Echo (3/15) with EF 60-65%.  - Echo (3/16) with EF 30-35%, mild LVH, mild aortic stenosis mean gradient 13 mmHg, mild MR, RV mildly dilated with normal systolic function, moderate TR.  - LHC/RHC (2/17): minimal luminal irregularities in coronaries.  Mean RA 4, PA 57/22, PCWP mean 15, PVR 2.7 WU, CI 3.29. Peak-to-peak aortic valve gradient 21 mmHg.  - Echo (2/17) with EF 40-45%, moderate LV hypertrophy, mild MR, normal RV size and systolic function, PASP 45 mmHg, moderate TR, moderate-severe aortic stenosis with mean gradient 30 mmHg, peak 78 mmHg, AVA 0.8 cm^2.  - TEE (3/17) with EF 45%, severely calcified aortic valve with mean gradient 16 mmHg but AVA 0.7 cm^2 (aortic gradient likely underestimated based on 2/17 TTE => ?low gradient severe AS versus pseudostenosis), possible anterior mitral leaflet chordal rupture with very eccentric, posteriorly-directed mitral regurgitation (Coanda effect).   - Echo (3/18) with EF 60-65%, moderate LVH, moderate AS, trivial MR, severe biatrial enlargement,  small secundum ASD, PASP 45 mmHg.  6. Aortic stenosis: Moderate on 3/18 echo.  7. Mitral regurgitation: Appeared severe by TEE with possible chordal rupture in 3/17.  MV clip attempted at [facility], failed. However, with improvement of EF, only trivial MR on 3/18 echo.  8. Porcelain aorta. 9. COPD: PFTs (4/17) with FVC 55%, FEV1 49%, radio 89%, DLCO 50% => moderate to severe obstructive lung disease.  10. Upper GI bleed: 3/18, gastric ulcer found on EGD.   FH: Atrial fibrillation.  No CAD.  SH: Retired, widow, lives in [place], quit smoking 1989, originally from [place], has 7 kids.    Current Outpatient Prescriptions  Medication Sig Dispense Refill  . acetaminophen (TYLENOL) 325 MG tablet Take 2 tablets (650 mg total) by mouth every 6 (six) hours as needed for mild pain (or Fever >/= 101). 30 tablet 0  . atorvastatin (LIPITOR) 20 MG tablet Take 1 tablet (20 mg total) by mouth daily. 90 tablet 3  . Calcium Carbonate-Vit D-Min 600-400 MG-UNIT TABS Take 1 tablet by mouth daily.     . carvedilol (COREG) 25 MG tablet Take 0.5 tablets (12.5 mg total) by mouth 2 (two) times daily. 60 tablet 6  . Cinnamon 500 MG capsule Take 500 mg by mouth daily.      . dorzolamide-timolol (COSOPT) 22.3-6.8 MG/ML ophthalmic solution Place 1 drop into both eyes 2 (two) times daily.     . ferrous sulfate 325 (65 FE) MG tablet Take 325 mg by mouth daily with breakfast.    . furosemide (LASIX) 80 MG tablet TAKE ONE TABLET BY MOUTH TWICE DAILY 60 tablet 6  . gabapentin (NEURONTIN) 300 MG capsule Take 300 mg by mouth 3 (three) times daily.    Marland Kitchen glipiZIDE (GLUCOTROL XL) 10 MG 24 hr tablet Take 1 tablet (10 mg total) by mouth daily. 90 tablet 1  . glucose blood (ACCU-CHEK AVIVA PLUS) test strip Use as instructed 100 each 11  . latanoprost (XALATAN) 0.005 % ophthalmic solution Place 1 drop into both eyes at bedtime.     Marland Kitchen linagliptin (TRADJENTA) 5 MG TABS tablet Take 5 mg by mouth daily.    . magnesium oxide (MAG-OX) 400 MG tablet Take 400 mg by mouth daily.    . metFORMIN (GLUCOPHAGE) 500 MG tablet Take 250 mg by mouth 2 (two) times daily with a meal.    . Multiple Vitamins-Minerals (MULTIVITAL) tablet Take 1 tablet by mouth daily.      . Omega-3 Fatty Acids (FISH OIL) 1000 MG CAPS Take 1 capsule by mouth daily.     . pantoprazole (PROTONIX) 40 MG tablet Take 1 tablet (40 mg total) by mouth daily. 30 tablet 0  . sacubitril-valsartan (ENTRESTO) 24-26 MG Take 1 tablet by mouth 2 (two) times daily. 60 tablet 11  . spironolactone (ALDACTONE)  25 MG tablet Take 0.5 tablets (12.5 mg total) by mouth daily. 15 tablet 3  . tiotropium (SPIRIVA HANDIHALER) 18 MCG inhalation capsule Place 1 capsule (18 mcg total) into inhaler and inhale daily. 30 capsule 3  . vitamin E 400 UNIT capsule Take 400 Units by mouth daily.      Marland Kitchen warfarin (COUMADIN) 5 MG tablet Take 1 tablet (5 mg total) by mouth daily. Takes 2.5mg  on Monday,wednesday and friday and 5mg  all other days. 30 tablet 0   No current facility-administered medications for this encounter.    BP (!) 102/58 (BP Location: Left Arm, Patient Position: Sitting, Cuff Size: Normal)   Pulse (!) 57  Wt 157 lb 1.9 oz (71.3 kg)   SpO2 99%   BMI 30.69 kg/m    Wt Readings from Last 3 Encounters:  01/25/17 157 lb 1.9 oz (71.3 kg)  01/09/17 160 lb 12.8 oz (72.9 kg)  01/03/17 167 lb (75.8 kg)   General: NAD Psych: Normal affect. HEENT: Normal, without mass or lesion. Neck: Supple, no bruits or JVD. Carotids 2+. No lymphadenopathy/thyromegaly appreciated. Heart: Non-displaced PMI. Irr Irr. 2/6 systolic crescendo-decrescendo murmur RUSB with clear S2.  Lungs: CTAB Abdomen: Soft, non-tender, non-distended, No HSM, BS + x 4.  Extremities: No clubbing or cyanosis. DP/PT/Radials 2+ and equal bilaterally. No peripheral edema.  Neuro: Alert and oriented X 3. Moves all extremities spontaneously.   Assessment/Plan: 1. Chronic systolic CHF: Echo (1/61) with EF 30-35%, diffuse hypokinesis, improved to 45% on 3/17 TEE and then to 60-65% on 3/18 echo.  Nonischemic cardiomyopathy, no significant coronary disease on angiography.  NYHA Class II symptoms. She is not volume overloaded. - Continue lasix 80 mg BID with extra as needed.  - BMET/BNP today.   - Continue Coreg 25 mg bid.  - Continue Entresto 24/26 bid.   - Continue spironolactone 12.5 mg daily. 2. Atrial fibrillation: Chronic.   - Rate controlled.  - Continue coumadin per PCP  - Continue coreg as above.  3. Aortic stenosis: On 2/17  echo, severe aortic stenosis by AVA, moderate by mean gradient.  By cath, suspect moderate AS given relative ease of crossing valve.  TEE showed low gradient probably moderate aortic stenosis.  Most recent echo in 3/18 with moderate AS.  Repeat echo in 3/19 to follow valvular disease.  4. Pulmonary hypertension: Mild to moderate pulmonary hypertension on RHC but PVR not significantly elevated.  - Sleep study would be reasonable but she prefers to defer at this time.  5. Mitral regurgitation: Suspected rupture of an anterior leaflet chord with very eccentric mitral regurgitation.  MR was severe on 3/17 TEE.   Was seen for surgery eval for MV repair and AoV replacement.  However, given porcelain aorta, she was deemed not a candidate for open heart surgery. She was sent for percutaneous MV clipping.  This was attempted by Dr Bridgette Habermann at [facility] but procedure failed due to the pattern of MV calcification.  Most recent echo in 3/18 showed surprisingly only trivial to mild MR in the setting of recovery of EF to normal.   6. COPD: Moderate to severe by recent PFTs. - Continue Spiriva. No change to current medications.   7. Anemia: Upper GI bleed in 3/18, possibly from gastric ulcer.  She is back on warfarin.  She will have a colonoscopy in 6/18.   - CBC today.   Followup in 3 months.   Loralie Champagne, MD  01/27/2017

## 2017-02-18 ENCOUNTER — Other Ambulatory Visit (HOSPITAL_COMMUNITY): Payer: Self-pay | Admitting: *Deleted

## 2017-02-18 MED ORDER — ATORVASTATIN CALCIUM 20 MG PO TABS: 20.0000 mg | ORAL_TABLET | Freq: Every day | ORAL | 3 refills | Status: AC

## 2017-03-06 ENCOUNTER — Telehealth (HOSPITAL_COMMUNITY): Payer: Self-pay | Admitting: *Deleted

## 2017-03-06 NOTE — Telephone Encounter (Signed)
Increase to 120 qam/80 qpm x 3 days then back to 80 mg bid.

## 2017-03-06 NOTE — Telephone Encounter (Signed)
Pt called complaining of a 4lb weight gain in three days and increased shortness of breath.  She is taking lasix as prescribed 80mg  bid.  Message routed to [place] for advice.

## 2017-03-06 NOTE — Telephone Encounter (Signed)
Pt aware and agreeable. Advised patient to call us back back if medication changes do not help.

## 2017-03-08 DIAGNOSIS — Z9289 Personal history of other medical treatment: Secondary | ICD-10-CM

## 2017-03-08 HISTORY — DX: Personal history of other medical treatment: Z92.89

## 2017-03-09 NOTE — Addendum Note (Signed)
Addendum  created 03/09/17 1032 by Duane Boston, MD   Sign clinical note

## 2017-03-13 ENCOUNTER — Other Ambulatory Visit: Payer: Self-pay

## 2017-03-13 ENCOUNTER — Emergency Department (HOSPITAL_COMMUNITY)
Admission: EM | Admit: 2017-03-13 | Discharge: 2017-03-13 | Disposition: A | Discharge status: Home / Self Care | Payer: Medicare HMO | Attending: Emergency Medicine | Admitting: Emergency Medicine

## 2017-03-13 ENCOUNTER — Encounter (HOSPITAL_COMMUNITY): Payer: Self-pay | Admitting: Emergency Medicine

## 2017-03-13 DIAGNOSIS — I110 Hypertensive heart disease with heart failure: Secondary | ICD-10-CM | POA: Insufficient documentation

## 2017-03-13 DIAGNOSIS — Z79899 Other long term (current) drug therapy: Secondary | ICD-10-CM | POA: Diagnosis not present

## 2017-03-13 DIAGNOSIS — I8390 Asymptomatic varicose veins of unspecified lower extremity: Secondary | ICD-10-CM | POA: Diagnosis not present

## 2017-03-13 DIAGNOSIS — D500 Iron deficiency anemia secondary to blood loss (chronic): Secondary | ICD-10-CM | POA: Insufficient documentation

## 2017-03-13 DIAGNOSIS — Z87891 Personal history of nicotine dependence: Secondary | ICD-10-CM | POA: Insufficient documentation

## 2017-03-13 DIAGNOSIS — Z7901 Long term (current) use of anticoagulants: Secondary | ICD-10-CM | POA: Insufficient documentation

## 2017-03-13 DIAGNOSIS — I482 Chronic atrial fibrillation, unspecified: Secondary | ICD-10-CM

## 2017-03-13 DIAGNOSIS — I83899 Varicose veins of unspecified lower extremities with other complications: Secondary | ICD-10-CM

## 2017-03-13 DIAGNOSIS — Z96651 Presence of right artificial knee joint: Secondary | ICD-10-CM | POA: Insufficient documentation

## 2017-03-13 DIAGNOSIS — I5042 Chronic combined systolic (congestive) and diastolic (congestive) heart failure: Secondary | ICD-10-CM | POA: Diagnosis not present

## 2017-03-13 DIAGNOSIS — E119 Type 2 diabetes mellitus without complications: Secondary | ICD-10-CM | POA: Diagnosis not present

## 2017-03-13 DIAGNOSIS — Z7984 Long term (current) use of oral hypoglycemic drugs: Secondary | ICD-10-CM | POA: Insufficient documentation

## 2017-03-13 DIAGNOSIS — I83813 Varicose veins of bilateral lower extremities with pain: Secondary | ICD-10-CM | POA: Diagnosis present

## 2017-03-13 LAB — BASIC METABOLIC PANEL
ANION GAP: 9 (ref 5–15)
BUN: 37 mg/dL — ABNORMAL HIGH (ref 6–20)
CALCIUM: 8.6 mg/dL — AB (ref 8.9–10.3)
CO2: 26 mmol/L (ref 22–32)
CREATININE: 1.53 mg/dL — AB (ref 0.44–1.00)
Chloride: 102 mmol/L (ref 101–111)
GFR, EST AFRICAN AMERICAN: 36 mL/min — AB (ref >60)
GFR, EST NON AFRICAN AMERICAN: 31 mL/min — AB (ref >60)
GLUCOSE: 143 mg/dL — AB (ref 65–99)
Potassium: 3.7 mmol/L (ref 3.5–5.1)
Sodium: 137 mmol/L (ref 135–145)

## 2017-03-13 LAB — PROTIME-INR
INR: 1.75
Prothrombin Time: 20.6 seconds — ABNORMAL HIGH (ref 11.4–15.2)

## 2017-03-13 LAB — CBC
HCT: 26.4 % — ABNORMAL LOW (ref 36.0–46.0)
Hemoglobin: 8.1 g/dL — ABNORMAL LOW (ref 12.0–15.0)
MCH: 28.2 pg (ref 26.0–34.0)
MCHC: 30.7 g/dL (ref 30.0–36.0)
MCV: 92 fL (ref 78.0–100.0)
PLATELETS: 156 10*3/uL (ref 150–400)
RBC: 2.87 MIL/uL — ABNORMAL LOW (ref 3.87–5.11)
RDW: 18.6 % — AB (ref 11.5–15.5)
WBC: 6.1 10*3/uL (ref 4.0–10.5)

## 2017-03-13 MED ORDER — SODIUM CHLORIDE 0.9 % IV BOLUS (SEPSIS)
500.0000 mL | Freq: Once | INTRAVENOUS | Status: AC
  Administered 2017-03-13: 500 mL via INTRAVENOUS

## 2017-03-13 MED ORDER — LIDOCAINE-EPINEPHRINE 1 %-1:100000 IJ SOLN
10.0000 mL | Freq: Once | INTRAMUSCULAR | Status: AC
  Administered 2017-03-13: 10 mL via INTRADERMAL
  Filled 2017-03-13: qty 10

## 2017-03-13 NOTE — ED Provider Notes (Signed)
[facility] DEPT Provider Note   CSN: [id number] Arrival date & time: 03/13/17  1658     History   Chief Complaint Chief Complaint  Patient presents with  . Varicose Veins    HPI Destiny [name] is a 82 y.o. female.  HPI   81 year old female with a history of chronic atrial fibrillation on Coumadin which has been held for the last 2 days due to upcoming colonoscopy, diastolic CHF, hyperlipidemia, diabetes, chronic back pain, admission in March with concern for GI bleed with acute on chronic anemia, undergoing continuing workup for source in setting of endoscopy showing clean-based gastric ulcer, who presents with concern for bleeding from a varicose vein in her left lower extremity. Patient reports that she was in the shower, looked down and had spontaneous bleeding from a varicose vein in her left lower extremity. Reports she does not have a history of same. Denies trauma to the area. Reports that it started about 2:00. Daughter reports that they're about 5 towels which were saturated with blood. She called her daughter-in-law around 3:00, and EMS came to evaluate her. Denies any other associated symptoms, including no syncope, no lightheadedness, no chest pain or dyspnea. Denies feeling palpitations. Reports that she cannot tell that she is in atrial fibrillation.  Past Medical History:  Diagnosis Date  . Aortic calcification (HCC) 02/20/2016   Involving entire thoracic and abdominal aorta  . Atrial fibrillation ([place]) 1999-diagnosed  . Calculus of kidney   . Cataract    left eye (removed)  . Complication of anesthesia    slow to awaken  . Diabetes mellitus without mention of complication   . Diastolic CHF, chronic ([place]) 12/30/2014  . Edema   . Incidental pulmonary nodule    Results of CT scan:  Lungs/Pleura: There is some progressive scarring and reticulonodular opacity in the left upper lobe and lingula extending to the anterior pleural surface. There is some associated mild  traction bronchiectasis in the lingula. Findings are suggestive of postinflammatory/postinfectious changes. Recommend correlation with any active infectious symptoms. CT follow-up would be appro  . Mitral valve insufficiency and aortic valve insufficiency   . Mononeuritis of unspecified site   . Osteoarthrosis, unspecified whether generalized or localized, unspecified site   . Pure hypercholesterolemia   . Unspecified essential hypertension   . Unspecified glaucoma(365.9)     Patient Active Problem List   Diagnosis Date Noted  . Upper GI bleed 01/02/2017  . Hypotension 01/02/2017  . Normocytic anemia 11/23/2016  . Aortic calcification ([place]) 02/20/2016  . Incidental pulmonary nodule 02/02/2016  . Pulmonary hypertension ([place]) 01/02/2016  . Chronic combined systolic and diastolic congestive heart failure ([place]) 10/25/2015  . Aortic stenosis 10/25/2015  . Chronic anticoagulation 10/24/2015  . Diabetes mellitus type 2, controlled ([place]) 07/13/2014  . Skin lesion 07/02/2014  . Carotid bruit 11/03/2013  . CARDIAC MURMUR 06/21/2009  . HYPERCHOLESTEROLEMIA 06/20/2009  . NEUROPATHY 06/20/2009  . GLAUCOMA 06/20/2009  . MITRAL INSUFFICIENCY 06/20/2009  . Essential hypertension 06/20/2009  . Atrial fibrillation ([place]) 06/20/2009  . OSTEOARTHRITIS 06/20/2009    Past Surgical History:  Procedure Laterality Date  . ABDOMINAL HYSTERECTOMY  2000   partial  . APPENDECTOMY  1954  . CARDIAC CATHETERIZATION N/A 11/16/2015   Procedure: Right/Left Heart Cath and Coronary Angiography;  Surgeon: Larey Dresser, MD;  Location: [facility];  Service: Cardiovascular;  Laterality: N/A;  . catarct surgery Left   . CYSTOSCOPY W/ RETROGRADES Right 11/26/2012   Procedure: CYSTOSCOPY WITH RETROGRADE PYELOGRAM;  Surgeon: Alexis Frock, MD;  Location: [facility];  Service: Urology;  Laterality: Right;  . ESOPHAGOGASTRODUODENOSCOPY (EGD) WITH PROPOFOL N/A 01/03/2017   Procedure:  ESOPHAGOGASTRODUODENOSCOPY (EGD) WITH PROPOFOL;  Surgeon: Clarene Essex, MD;  Location: [facility] ENDOSCOPY;  Service: Endoscopy;  Laterality: N/A;  . HOLMIUM LASER APPLICATION Right 9/37/1696   Procedure: HOLMIUM LASER APPLICATION;  Surgeon: Alexis Frock, MD;  Location: [facility];  Service: Urology;  Laterality: Right;  . JOINT REPLACEMENT Right 2009   knee  . kidney stone     ? Dr Tammi Klippel  . TEE WITHOUT CARDIOVERSION N/A 12/08/2015   Procedure: TRANSESOPHAGEAL ECHOCARDIOGRAM (TEE);  Surgeon: Larey Dresser, MD;  Location: [facility];  Service: Cardiovascular;  Laterality: N/A;  . TONSILLECTOMY  1943  . TRANSESOPHAGEAL ECHOCARDIOGRAM  02/2012    OB History    No data available       Home Medications    Prior to Admission medications   Medication Sig Start Date End Date Taking? Authorizing Provider  acetaminophen (TYLENOL) 325 MG tablet Take 2 tablets (650 mg total) by mouth every 6 (six) hours as needed for mild pain (or Fever >/= 101). 01/03/17   Regalado, Belkys A, MD  atorvastatin (LIPITOR) 20 MG tablet Take 1 tablet (20 mg total) by mouth daily. 02/18/17   Larey Dresser, MD  Calcium Carbonate-Vit D-Min 600-400 MG-UNIT TABS Take 1 tablet by mouth daily.     [provider]  carvedilol (COREG) 25 MG tablet Take 0.5 tablets (12.5 mg total) by mouth 2 (two) times daily. 05/23/16 11/02/17  Larey Dresser, MD  Cinnamon 500 MG capsule Take 500 mg by mouth daily.      [provider]  dorzolamide-timolol (COSOPT) 22.3-6.8 MG/ML ophthalmic solution Place 1 drop into both eyes 2 (two) times daily.     [provider]  ferrous sulfate 325 (65 FE) MG tablet Take 325 mg by mouth daily with breakfast.    [provider]  furosemide (LASIX) 80 MG tablet TAKE ONE TABLET BY MOUTH TWICE DAILY 01/10/17   Bensimhon, Shaune Pascal, MD  gabapentin (NEURONTIN) 300 MG capsule Take 300 mg by mouth 3 (three) times daily.    [provider]  glipiZIDE  (GLUCOTROL XL) 10 MG 24 hr tablet Take 1 tablet (10 mg total) by mouth daily. 10/24/15   Kuneff, Renee A, DO  glucose blood (ACCU-CHEK AVIVA PLUS) test strip Use as instructed 10/28/15   Kuneff, Renee A, DO  latanoprost (XALATAN) 0.005 % ophthalmic solution Place 1 drop into both eyes at bedtime.     [provider]  linagliptin (TRADJENTA) 5 MG TABS tablet Take 5 mg by mouth daily.    [provider]  magnesium oxide (MAG-OX) 400 MG tablet Take 400 mg by mouth daily.    [provider]  metFORMIN (GLUCOPHAGE) 500 MG tablet Take 250 mg by mouth 2 (two) times daily with a meal.    [provider]  Multiple Vitamins-Minerals (MULTIVITAL) tablet Take 1 tablet by mouth daily.      [provider]  Omega-3 Fatty Acids (FISH OIL) 1000 MG CAPS Take 1 capsule by mouth daily.     [provider]  pantoprazole (PROTONIX) 40 MG tablet Take 1 tablet (40 mg total) by mouth daily. 01/03/17   Regalado, Belkys A, MD  sacubitril-valsartan (ENTRESTO) 24-26 MG Take 1 tablet by mouth 2 (two) times daily. 11/05/16   Larey Dresser, MD  spironolactone (ALDACTONE) 25 MG tablet Take  0.5 tablets (12.5 mg total) by mouth daily. 11/05/16   Larey Dresser, MD  tiotropium (SPIRIVA HANDIHALER) 18 MCG inhalation capsule Place 1 capsule (18 mcg total) into inhaler and inhale daily. 03/01/16   Larey Dresser, MD  vitamin E 400 UNIT capsule Take 400 Units by mouth daily.      [provider]  warfarin (COUMADIN) 5 MG tablet Take 1 tablet (5 mg total) by mouth daily. Takes 2.5mg  on Monday,wednesday and friday and 5mg  all other days. 01/03/17   Regalado, Cassie Freer, MD    Family History Family History  Problem Relation Age of Onset  . Cancer Mother 50       leukemia   . Breast cancer Neg Hx   . Colon cancer Neg Hx     Social History Social History  Substance Use Topics  . Smoking status: Former Smoker    Quit date: 07/11/1988  . Smokeless tobacco: Never Used  .  Alcohol use Yes     Comment: occasional     Allergies   Patient has no known allergies.   Review of Systems Review of Systems  Constitutional: Negative for fever.  HENT: Negative for sore throat.   Eyes: Negative for visual disturbance.  Respiratory: Negative for cough and shortness of breath.   Cardiovascular: Negative for chest pain.  Gastrointestinal: Negative for abdominal pain.  Genitourinary: Negative for difficulty urinating.  Skin: Positive for wound (bleeding varicose vein). Negative for rash.  Neurological: Negative for syncope and headaches.     Physical Exam Updated Vital Signs BP 111/73   Pulse (!) 109   Temp 98.2 F (36.8 C) (Oral)   Resp 15   SpO2 93%   Physical Exam  Constitutional: She is oriented to person, place, and time. She appears well-developed and well-nourished. No distress.  HENT:  Head: Normocephalic and atraumatic.  Eyes: Conjunctivae and EOM are normal.  Neck: Normal range of motion.  Cardiovascular: Intact distal pulses.  An irregularly irregular rhythm present. Tachycardia present.   Pulmonary/Chest: Effort normal and breath sounds normal. No respiratory distress. She has no wheezes. She has no rales.  Abdominal: Soft. She exhibits no distension. There is no tenderness. There is no guarding.  Musculoskeletal: She exhibits no edema or tenderness.  Neurological: She is alert and oriented to person, place, and time.  Skin: Skin is warm and dry. No rash noted. She is not diaphoretic. No erythema.  Varicose vein with bleeding left lateral ankle  Nursing note and vitals reviewed.    ED Treatments / Results  Labs (all labs ordered are listed, but only abnormal results are displayed) Labs Reviewed  CBC - Abnormal; Notable for the following:       Result Value   RBC 2.87 (*)    Hemoglobin 8.1 (*)    HCT 26.4 (*)    RDW 18.6 (*)    All other components within normal limits  BASIC METABOLIC PANEL - Abnormal; Notable for the following:      Glucose, Bld 143 (*)    BUN 37 (*)    Creatinine, Ser 1.53 (*)    Calcium 8.6 (*)    GFR calc non Af Amer 31 (*)    GFR calc Af Amer 36 (*)    All other components within normal limits  PROTIME-INR - Abnormal; Notable for the following:    Prothrombin Time 20.6 (*)    All other components within normal limits    EKG  EKG Interpretation None  Radiology No results found.  Procedures Procedures (including critical care time)  Medications Ordered in ED Medications  lidocaine-EPINEPHrine (XYLOCAINE W/EPI) 1 %-1:100000 (with pres) injection 10 mL (10 mLs Intradermal Given 03/13/17 1730)  sodium chloride 0.9 % bolus 500 mL (0 mLs Intravenous Stopped 03/13/17 2016)     Initial Impression / Assessment and Plan / ED Course  I have reviewed the triage vital signs and the nursing notes.  Pertinent labs & imaging results that were available during my care of the patient were reviewed by me and considered in my medical decision making (see chart for details).    82 year old female with a history of chronic atrial fibrillation on Coumadin which has been held for the last 2 days due to upcoming colonoscopy, diastolic CHF, hyperlipidemia, diabetes, chronic back pain, admission in March with concern for GI bleed with acute on chronic anemia, undergoing continuing workup for source in setting of endoscopy showing clean-based gastric ulcer, who presents with concern for bleeding from a varicose vein in her left lower extremity. Patient initially tachycardic in atrial fibrillation into the 130s. Lab work was obtained which showed hemoglobin of 8.1 which is decreased from April value of 9.8, hemoglobin at discharge in March was 8.6 following transfusions. Patient reports she has had chronic black stool, on iron until yesterday, and that bowel movements have decreased in number. Given symptoms, doubt additional acute GI bleed.  Patient's heart rate decreased spontaneously with rest in the ED, feel  driven by atrial fibrillation and anxiety rather than critical hemorrhagic etiology.  INR WNL.  Pressure dressing placed with control of bleeding. Injected lidocaine/epinephrine for continued support and placed pressure dressing. No further bleeding in the ED.  Noted to have a few lower blood pressures while staff out of room. Possible multifactorial with dehydration from using lasix recently, having anemia as well as bleeding today. Given small amount of IV fluids. Patient asymptomatic, asking to go home.  Ambulating while talking, no shortness of breath, pain or bleeding from varicose vein.  Blood pressures remain stable after observation for several hours in the ED and patient remains asymptomatic.  Colonoscopy cancelled for tomorrow. Patient stable for outpatient monitoring of hgb on Friday, continued follow up with GI.  Discussed reasons to return in detail.    Final Clinical Impressions(s) / ED Diagnoses   Final diagnoses:  Chronic atrial fibrillation (HCC)  Bleeding from varicose vein  Iron deficiency anemia due to chronic blood loss    New Prescriptions Discharge Medication List as of 03/13/2017  9:43 PM       Gareth Morgan, MD 03/14/17 0121

## 2017-03-13 NOTE — ED Triage Notes (Signed)
Per EMS:  Pt presents to ED for assessment of left ankle varicose vein that began bleeding while she was in the shower.  Pt stopped taking her blood thinner (warfarin) on Monday due to having a scheduled colonoscopy tomorrow to look for internal bleeding.  Pt in NAD.

## 2017-03-13 NOTE — ED Notes (Signed)
Pt ambulated from E38 to E42 while on a pulse ox. Pt O2 stats started at 93 and was around 89 while ambulating. Pt denied any sob or pain. Pt uses a cane while ambulating which she uses at home. RR rate is 18. Pt daughter walks beside pt while she ambulates. Pt ambulates back to her room.

## 2017-03-13 NOTE — ED Notes (Signed)
MD at bedside updating patient/family 

## 2017-03-13 NOTE — ED Notes (Signed)
Spoke to pharmacy about need for lidocaine with epi.  MD at bedside.

## 2017-03-18 ENCOUNTER — Telehealth (HOSPITAL_COMMUNITY): Payer: Self-pay | Admitting: *Deleted

## 2017-03-18 NOTE — Telephone Encounter (Signed)
Pt on ED afib follow up list.  Not appropriate for follow up, controlled afib, will f/u with pcp per AVS for bleeding vericose vein

## 2017-04-16 ENCOUNTER — Telehealth (HOSPITAL_COMMUNITY): Payer: Self-pay | Admitting: Pharmacist

## 2017-04-16 NOTE — Telephone Encounter (Signed)
Destiny [name] PAN foundation for assistance with her Destiny [name] copays will expire on 05/02/17 so I have renewed her enrollment through 05/02/18.   Member ID: [id number] Group ID: [id number] RxBin ID: [id number] PCN: [id number] Eligibility Start Date: 05/03/2017 Eligibility End Date: 05/02/2018 Assistance Amount: $800.00  Doroteo Bradford K. Velva Harman, PharmD, BCPS, CPP Clinical Pharmacist Pager: [phone] Phone: [phone] 04/16/2017 2:51 PM

## 2017-04-19 ENCOUNTER — Telehealth (HOSPITAL_COMMUNITY): Payer: Self-pay | Admitting: *Deleted

## 2017-04-19 NOTE — Telephone Encounter (Signed)
Patient called triage line stating she gained 3 lbs overnight and she feels a little short of breath.  Per Dr. Claris Gladden last office note, I advised patient to take an extra lasix today and if her weight is still up Monday to call us back.  I also reminded her that if she start to feel worse over the weekend to go to the emergency room.  Patient understands and no further questions at this time.

## 2017-05-01 ENCOUNTER — Telehealth (HOSPITAL_COMMUNITY): Payer: Self-pay

## 2017-05-01 NOTE — Telephone Encounter (Signed)
Patient left VM on CHF clinic triage line yesterday afternoon asking for a call back. Did not specify needs. Returned call this morning and had to leave VM.  Advised patient to call us back and to leave a detailed message of her needs if she had to leave another VM.  Renee Pain, RN

## 2017-05-01 NOTE — Telephone Encounter (Signed)
Patient called CHF clinic to report 12 lb weight gain over past week with SOB and increased BLEE with leg tightness that causes pain with ambulation. Advised to take 160 mg Lasix now (usually takes 80 mg twice daily) per Dr. Aundra Dubin and schedule next available with Dr. Aundra Dubin or with midlevel CHF clinic. Patient states she will have her transporter call our clinic to schedule the appointment based on her availability. Advised to return call to clinic if symptoms worsen.  Renee Pain, RN

## 2017-05-02 ENCOUNTER — Ambulatory Visit (HOSPITAL_COMMUNITY)
Admission: RE | Admit: 2017-05-02 | Discharge: 2017-05-02 | Disposition: A | Discharge status: Home / Self Care | Payer: Medicare HMO | Source: Ambulatory Visit | Attending: Cardiology | Admitting: Cardiology

## 2017-05-02 VITALS — BP 96/68 | HR 81 | Wt 172.8 lb

## 2017-05-02 DIAGNOSIS — Z87891 Personal history of nicotine dependence: Secondary | ICD-10-CM | POA: Insufficient documentation

## 2017-05-02 DIAGNOSIS — I482 Chronic atrial fibrillation, unspecified: Secondary | ICD-10-CM

## 2017-05-02 DIAGNOSIS — I429 Cardiomyopathy, unspecified: Secondary | ICD-10-CM | POA: Insufficient documentation

## 2017-05-02 DIAGNOSIS — Z7902 Long term (current) use of antithrombotics/antiplatelets: Secondary | ICD-10-CM | POA: Insufficient documentation

## 2017-05-02 DIAGNOSIS — I350 Nonrheumatic aortic (valve) stenosis: Secondary | ICD-10-CM | POA: Insufficient documentation

## 2017-05-02 DIAGNOSIS — J449 Chronic obstructive pulmonary disease, unspecified: Secondary | ICD-10-CM | POA: Insufficient documentation

## 2017-05-02 DIAGNOSIS — Z8249 Family history of ischemic heart disease and other diseases of the circulatory system: Secondary | ICD-10-CM | POA: Diagnosis not present

## 2017-05-02 DIAGNOSIS — Z7984 Long term (current) use of oral hypoglycemic drugs: Secondary | ICD-10-CM | POA: Insufficient documentation

## 2017-05-02 DIAGNOSIS — Z8719 Personal history of other diseases of the digestive system: Secondary | ICD-10-CM | POA: Insufficient documentation

## 2017-05-02 DIAGNOSIS — I5042 Chronic combined systolic (congestive) and diastolic (congestive) heart failure: Secondary | ICD-10-CM | POA: Diagnosis not present

## 2017-05-02 DIAGNOSIS — I272 Pulmonary hypertension, unspecified: Secondary | ICD-10-CM | POA: Diagnosis not present

## 2017-05-02 DIAGNOSIS — I110 Hypertensive heart disease with heart failure: Secondary | ICD-10-CM | POA: Diagnosis not present

## 2017-05-02 DIAGNOSIS — E119 Type 2 diabetes mellitus without complications: Secondary | ICD-10-CM | POA: Diagnosis not present

## 2017-05-02 DIAGNOSIS — I5022 Chronic systolic (congestive) heart failure: Secondary | ICD-10-CM | POA: Diagnosis present

## 2017-05-02 DIAGNOSIS — I080 Rheumatic disorders of both mitral and aortic valves: Secondary | ICD-10-CM

## 2017-05-02 DIAGNOSIS — D649 Anemia, unspecified: Secondary | ICD-10-CM | POA: Diagnosis not present

## 2017-05-02 DIAGNOSIS — I340 Nonrheumatic mitral (valve) insufficiency: Secondary | ICD-10-CM | POA: Diagnosis not present

## 2017-05-02 LAB — BASIC METABOLIC PANEL
ANION GAP: 10 (ref 5–15)
BUN: 52 mg/dL — AB (ref 6–20)
CHLORIDE: 103 mmol/L (ref 101–111)
CO2: 28 mmol/L (ref 22–32)
Calcium: 9.1 mg/dL (ref 8.9–10.3)
Creatinine, Ser: 1.97 mg/dL — ABNORMAL HIGH (ref 0.44–1.00)
GFR calc Af Amer: 26 mL/min — ABNORMAL LOW (ref >60)
GFR, EST NON AFRICAN AMERICAN: 23 mL/min — AB (ref >60)
GLUCOSE: 99 mg/dL (ref 65–99)
POTASSIUM: 4.4 mmol/L (ref 3.5–5.1)
SODIUM: 141 mmol/L (ref 135–145)

## 2017-05-02 LAB — CBC
HEMATOCRIT: 27.6 % — AB (ref 36.0–46.0)
HEMOGLOBIN: 8.3 g/dL — AB (ref 12.0–15.0)
MCH: 27.7 pg (ref 26.0–34.0)
MCHC: 30.1 g/dL (ref 30.0–36.0)
MCV: 92 fL (ref 78.0–100.0)
Platelets: 182 10*3/uL (ref 150–400)
RBC: 3 MIL/uL — ABNORMAL LOW (ref 3.87–5.11)
RDW: 15.7 % — AB (ref 11.5–15.5)
WBC: 5.8 10*3/uL (ref 4.0–10.5)

## 2017-05-02 MED ORDER — TORSEMIDE 20 MG PO TABS: 80.0000 mg | ORAL_TABLET | Freq: Two times a day (BID) | ORAL | 3 refills | Status: AC

## 2017-05-02 MED ORDER — FUROSEMIDE 10 MG/ML IJ SOLN
120.0000 mg | Freq: Once | INTRAVENOUS | Status: AC
  Administered 2017-05-02: 120 mg via INTRAVENOUS
  Filled 2017-05-02: qty 12

## 2017-05-02 MED ORDER — POTASSIUM CHLORIDE CRYS ER 20 MEQ PO TBCR
40.0000 meq | EXTENDED_RELEASE_TABLET | Freq: Once | ORAL | Status: AC
  Administered 2017-05-02: 40 meq via ORAL
  Filled 2017-05-02: qty 2

## 2017-05-02 MED ORDER — CARVEDILOL 6.25 MG PO TABS: 6.2500 mg | ORAL_TABLET | Freq: Two times a day (BID) | ORAL | 6 refills | Status: DC

## 2017-05-02 NOTE — Progress Notes (Signed)
22g PIV x 1 attempt started to patients RAC. 120 mg iv lasix pb administered over 1 hr. Patient tolerated well.  TOTAL UOP: 100cc clear yellow urine  Patient's PIV DC'd before patient discharged from clinic appointment today.  Renee Pain, RN

## 2017-05-02 NOTE — Progress Notes (Signed)
Patient ID: Destiny [name], female   DOB: 22-Apr-1935, 82 y.o.   MRN: [id number] PCP: Dr Neta Mends Cardiology: Dr. Johnsie Cancel HF Cardiology: Dr. Ralph Leyden is an 82 y.o. female  with PMH of HTN, DM type 2, Chronic Afib on Coumadin, Chronic systolic CHF due to NICM, Severe MR s/p failed MV clip, porcelain aorta, COPD, and at least moderate AS.   Given increased dyspnea, echo was done in 3/16 which showed EF 30-35%, newly decreased.  Pt had [facility] 11/2015 with no significant CAD and normal R/L filling pressures. Mild PAH. Echo 11/2015 LVEF 40-45% with Mod/severe aortic stenosis.   TEE in 3/17 with EF 45% with possible low gradient at least moderate AS and possible anterior mitral leaflet chordal rupture with concern for very eccentric, severe mitral regurgitation.  Saw Dr. Roxy Manns for cardiac surgery evaluation.  Initial plan was repair of mitral valve and replacement of aortic valve.  However, she was found on CTA chest to have a porcelain aorta and was thought to be too high risk for open heart surgery.    Pt has been seen at [facility] and mitral valve clip was attempted.  This failed due to significant calcification of the valve and apparatus.    Pt admitted 3/28 -> 01/03/17 with weakness and dizziness and found to have Hgb 6.5 with FOBT +. Pt underwent EGD with non-bleeding gastric ulcer. Biopsy performed.  Follow up with GI made.  HF team consulted for resumption of her HF meds with AKI.   Echo was done in 3/18 with EF up to 60-65%, moderate AS, and only trivial MR noted.    At last appointment, she was doing quite well.  However, for the last couple of weeks, she has been much more short of breath.  Weight is up.  It is unclear what has changed to trigger this . She has been taking all her medications.  She is short of breath dressing, getting in the car, walking around the house.  She has had several mechanical falls.  No syncope. She denies lightheadedness but SBP in 90s today.  She had a  colonoscopy yesterday with polyps removed . No BRBPR/melena.    Review of systems complete and found to be negative unless listed in HPI.    Labs (1/17): K 3.9, creatinine 0.63, BNP 198 Labs (2/17): K 4.4, creatinine 0.69 Labs (3/17): K 4.8, creatinine 0.78 Labs (5/17): K 4.1, creatinine 0.88, BNP 259 Labs (8/17): K 4.2, creatinine 1.2 => 1.19, BNP 251 Labs (10/17): K 4.3, creatinine 1.22, hgb 11.8 Labs (3/18): K 4.2, creatinine 1.67, hgb 8.6 Labs (7/18): K 4.5, creatinine 1.64  PMH: 1. HTN 2. DM type II 3. Chronic atrial fibrillation 4. Right TKR 12/10.  5. Chronic systolic CHF:  - Remote cath showed no significant CAD> - Echo (3/15) with EF 60-65%.  - Echo (3/16) with EF 30-35%, mild LVH, mild aortic stenosis mean gradient 13 mmHg, mild MR, RV mildly dilated with normal systolic function, moderate TR.  - LHC/RHC (2/17): minimal luminal irregularities in coronaries.  Mean RA 4, PA 57/22, PCWP mean 15, PVR 2.7 WU, CI 3.29. Peak-to-peak aortic valve gradient 21 mmHg.  - Echo (2/17) with EF 40-45%, moderate LV hypertrophy, mild MR, normal RV size and systolic function, PASP 45 mmHg, moderate TR, moderate-severe aortic stenosis with mean gradient 30 mmHg, peak 78 mmHg, AVA 0.8 cm^2.  - TEE (3/17) with EF 45%, severely calcified aortic valve with mean gradient 16 mmHg but AVA 0.7 cm^2 (  aortic gradient likely underestimated based on 2/17 TTE => ?low gradient severe AS versus pseudostenosis), possible anterior mitral leaflet chordal rupture with very eccentric, posteriorly-directed mitral regurgitation (Coanda effect).   - Echo (3/18) with EF 60-65%, moderate LVH, moderate AS, trivial MR, severe biatrial enlargement, small secundum ASD, PASP 45 mmHg.  6. Aortic stenosis: Moderate on 3/18 echo.  7. Mitral regurgitation: Appeared severe by TEE with possible chordal rupture in 3/17.  MV clip attempted at [facility], failed. However, with improvement of EF, only trivial MR on 3/18 echo.  8. Porcelain  aorta. 9. COPD: PFTs (4/17) with FVC 55%, FEV1 49%, radio 89%, DLCO 50% => moderate to severe obstructive lung disease.  10. Upper GI bleed: 3/18, gastric ulcer found on EGD.   FH: Atrial fibrillation.  No CAD.  SH: Retired, widow, lives in [place], quit smoking 1989, originally from [place], has 7 kids.    Current Outpatient Prescriptions  Medication Sig Dispense Refill  . acetaminophen (TYLENOL) 325 MG tablet Take 2 tablets (650 mg total) by mouth every 6 (six) hours as needed for mild pain (or Fever >/= 101). 30 tablet 0  . atorvastatin (LIPITOR) 20 MG tablet Take 1 tablet (20 mg total) by mouth daily. 90 tablet 3  . Calcium Carbonate-Vit D-Min 600-400 MG-UNIT TABS Take 1 tablet by mouth daily.     . carvedilol (COREG) 6.25 MG tablet Take 1 tablet (6.25 mg total) by mouth 2 (two) times daily with a meal. 60 tablet 6  . Cinnamon 500 MG capsule Take 500 mg by mouth daily.      . dorzolamide-timolol (COSOPT) 22.3-6.8 MG/ML ophthalmic solution Place 1 drop into both eyes 2 (two) times daily.     . ferrous sulfate 325 (65 FE) MG tablet Take 325 mg by mouth daily with breakfast.    . gabapentin (NEURONTIN) 300 MG capsule Take 300 mg by mouth 3 (three) times daily.    Marland Kitchen glipiZIDE (GLUCOTROL XL) 10 MG 24 hr tablet Take 1 tablet (10 mg total) by mouth daily. 90 tablet 1  . glucose blood (ACCU-CHEK AVIVA PLUS) test strip Use as instructed 100 each 11  . latanoprost (XALATAN) 0.005 % ophthalmic solution Place 1 drop into both eyes at bedtime.     Marland Kitchen linagliptin (TRADJENTA) 5 MG TABS tablet Take 5 mg by mouth daily.    . magnesium oxide (MAG-OX) 400 MG tablet Take 400 mg by mouth daily.    . metFORMIN (GLUCOPHAGE) 500 MG tablet Take 250 mg by mouth 2 (two) times daily with a meal.    . Multiple Vitamins-Minerals (MULTIVITAL) tablet Take 1 tablet by mouth daily.      . Omega-3 Fatty Acids (FISH OIL) 1000 MG CAPS Take 1 capsule by mouth daily.     . pantoprazole (PROTONIX) 40 MG tablet Take 1  tablet (40 mg total) by mouth daily. 30 tablet 0  . sacubitril-valsartan (ENTRESTO) 24-26 MG Take 1 tablet by mouth 2 (two) times daily. 60 tablet 11  . spironolactone (ALDACTONE) 25 MG tablet Take 0.5 tablets (12.5 mg total) by mouth daily. 15 tablet 3  . tiotropium (SPIRIVA HANDIHALER) 18 MCG inhalation capsule Place 1 capsule (18 mcg total) into inhaler and inhale daily. 30 capsule 3  . vitamin E 400 UNIT capsule Take 400 Units by mouth daily.      Marland Kitchen warfarin (COUMADIN) 5 MG tablet Take 1 tablet (5 mg total) by mouth daily. Takes 2.5mg  on Monday,wednesday and friday and 5mg  all other days. [place]  tablet 0  . torsemide (DEMADEX) 20 MG tablet Take 4 tablets (80 mg total) by mouth 2 (two) times daily. 120 tablet 3   No current facility-administered medications for this encounter.    BP 96/68 (BP Location: Left Arm, Patient Position: Sitting, Cuff Size: Normal)   Pulse 81   Wt 172 lb 12.8 oz (78.4 kg)   SpO2 92%   BMI 33.75 kg/m    Wt Readings from Last 3 Encounters:  05/02/17 172 lb 12.8 oz (78.4 kg)  01/25/17 157 lb 1.9 oz (71.3 kg)  01/09/17 160 lb 12.8 oz (72.9 kg)   General: NAD Psych: Normal affect. HEENT: Normal, without mass or lesion. Neck: JVP 14 cm. Carotids 2+. No lymphadenopathy/thyromegaly appreciated. Heart: Non-displaced PMI. Irr Irr. 2/6 systolic crescendo-decrescendo murmur RUSB with clear S2.  Lungs: CTAB Abdomen: Soft, non-tender, non-distended, No HSM, BS + x 4.  Extremities: No clubbing or cyanosis. DP/PT/Radials 2+ and equal bilaterally. 2+ edema to knees.  Neuro: Alert and oriented X 3. Moves all extremities spontaneously.   Assessment/Plan: 1. Chronic systolic CHF: Echo (4/27) with EF 30-35%, diffuse hypokinesis, improved to 45% on 3/17 TEE and then to 60-65% on 3/18 echo.  Nonischemic cardiomyopathy, no significant coronary disease on angiography. Today, she is much more symptomatic (NYHA class IIIb) and is volume overloaded on exam.  It is unclear  what triggered this worsening.  She looks uncomfortable.  - Will give Lasix 120 mg IV x 1 in the office today along with KCl 20 x 1.  - Stop po Lasix and start torsemide 80 mg daily tomorrow.    - BMET/BNP today and repeat in 1 week.   - Decrease Coreg to 6.25 mg bid with soft BP.   - Continue Entresto 24/26 bid.   - Continue spironolactone 12.5 mg daily. - Wear thigh high compression stockings.  - I will arrange a repeat echo to look for changes (last echo with improved EF).  2. Atrial fibrillation: Chronic.   - Rate controlled.  - Continue coumadin per PCP  - Continue coreg as above.  3. Aortic stenosis: On 2/17 echo, severe aortic stenosis by AVA, moderate by mean gradient.  By cath, suspect moderate AS given relative ease of crossing valve.  TEE showed low gradient probably moderate aortic stenosis.  Most recent echo in 3/18 with moderate AS.  As above, with clinical change, I am going to get a repeat echo.   4. Pulmonary hypertension: Mild to moderate pulmonary hypertension on RHC but PVR not significantly elevated.  - Sleep study would be reasonable but she prefers to defer at this time.  5. Mitral regurgitation: Suspected rupture of an anterior leaflet chord with very eccentric mitral regurgitation.  MR was severe on 3/17 TEE.   Was seen for surgery eval for MV repair and AoV replacement.  However, given porcelain aorta, she was deemed not a candidate for open heart surgery. She was sent for percutaneous MV clipping.  This was attempted by Dr Bridgette Habermann at [facility] but procedure failed due to the pattern of MV calcification.  Most recent echo in 3/18 showed surprisingly only trivial to mild MR in the setting of recovery of EF to normal.  As above, will repeat echo.  6. COPD: Moderate to severe by PFTs. - Continue Spiriva. No change to current medications.   7. Anemia: Upper GI bleed in 3/18, possibly from gastric ulcer.  She is back on warfarin.   - CBC today.  8. Type II diabetes: With CHF,  would avoid use of linagliptin.  Will ask PCP to consider changing to another agent.   Followup early next week with BMET.  If there is no improvement, she will need admission.   Loralie Champagne, MD  05/02/2017

## 2017-05-02 NOTE — Patient Instructions (Addendum)
DECREASE Coreg to 6.25 mg Twice Daily  STOP taking Lasix  START taking 80 mg of Torsemide (4 tablets) once Daily  Echocardiogram has been ordered for you, we will schedule at checkout  Follow up next week.

## 2017-05-03 ENCOUNTER — Ambulatory Visit (HOSPITAL_COMMUNITY)
Admission: RE | Admit: 2017-05-03 | Discharge: 2017-05-03 | Disposition: A | Discharge status: Home / Self Care | Payer: Medicare HMO | Source: Ambulatory Visit | Attending: Family Medicine | Admitting: Family Medicine

## 2017-05-03 DIAGNOSIS — Q211 Atrial septal defect: Secondary | ICD-10-CM | POA: Diagnosis not present

## 2017-05-03 DIAGNOSIS — I083 Combined rheumatic disorders of mitral, aortic and tricuspid valves: Secondary | ICD-10-CM | POA: Insufficient documentation

## 2017-05-03 DIAGNOSIS — I110 Hypertensive heart disease with heart failure: Secondary | ICD-10-CM | POA: Insufficient documentation

## 2017-05-03 DIAGNOSIS — E119 Type 2 diabetes mellitus without complications: Secondary | ICD-10-CM | POA: Insufficient documentation

## 2017-05-03 DIAGNOSIS — I4891 Unspecified atrial fibrillation: Secondary | ICD-10-CM | POA: Diagnosis not present

## 2017-05-03 DIAGNOSIS — I5042 Chronic combined systolic (congestive) and diastolic (congestive) heart failure: Secondary | ICD-10-CM | POA: Insufficient documentation

## 2017-05-03 DIAGNOSIS — I272 Pulmonary hypertension, unspecified: Secondary | ICD-10-CM | POA: Insufficient documentation

## 2017-05-03 NOTE — Progress Notes (Signed)
  Echocardiogram 2D Echocardiogram has been performed.  Destiny [name] 05/03/2017, 4:08 PM

## 2017-05-06 ENCOUNTER — Telehealth (HOSPITAL_COMMUNITY): Payer: Self-pay | Admitting: *Deleted

## 2017-05-06 NOTE — Telephone Encounter (Signed)
ECHOCARDIOGRAM COMPLETE  Order: [id number]  Status:  Edited Result - FINAL Visible to patient:  No (Not Released) Dx:  Chronic combined systolic and diastol...  Notes recorded by Darron Doom, RN on 05/06/2017 at 12:36 PM EDT Patient has appointment this Wednesday August 1st. ------  Notes recorded by Larey Dresser, MD on 05/04/2017 at 11:49 PM EDT Aortic insufficiency has worsened with moderate to severe AS. She may need TAVR. Will need to have TEE. Make sure she has followup soon with me.

## 2017-05-08 ENCOUNTER — Encounter (HOSPITAL_COMMUNITY): Payer: Self-pay | Admitting: General Practice

## 2017-05-08 ENCOUNTER — Ambulatory Visit (HOSPITAL_COMMUNITY)
Admission: RE | Admit: 2017-05-08 | Discharge: 2017-05-08 | Disposition: A | Discharge status: Home / Self Care | Payer: Medicare HMO | Source: Ambulatory Visit | Attending: Cardiology | Admitting: Cardiology

## 2017-05-08 ENCOUNTER — Encounter (HOSPITAL_COMMUNITY): Payer: Self-pay | Admitting: Cardiology

## 2017-05-08 ENCOUNTER — Inpatient Hospital Stay (HOSPITAL_COMMUNITY)
Admission: AD | Admit: 2017-05-08 | Discharge: 2017-05-17 | DRG: 287 | Disposition: A | Discharge status: Home Health | Payer: Medicare HMO | Source: Ambulatory Visit | Attending: Cardiology | Admitting: Cardiology

## 2017-05-08 VITALS — BP 88/62 | HR 130 | Wt 168.8 lb

## 2017-05-08 DIAGNOSIS — I350 Nonrheumatic aortic (valve) stenosis: Secondary | ICD-10-CM

## 2017-05-08 DIAGNOSIS — H409 Unspecified glaucoma: Secondary | ICD-10-CM | POA: Diagnosis present

## 2017-05-08 DIAGNOSIS — I482 Chronic atrial fibrillation: Secondary | ICD-10-CM

## 2017-05-08 DIAGNOSIS — I959 Hypotension, unspecified: Secondary | ICD-10-CM | POA: Diagnosis present

## 2017-05-08 DIAGNOSIS — N179 Acute kidney failure, unspecified: Secondary | ICD-10-CM | POA: Diagnosis not present

## 2017-05-08 DIAGNOSIS — I428 Other cardiomyopathies: Secondary | ICD-10-CM | POA: Insufficient documentation

## 2017-05-08 DIAGNOSIS — I700 Atherosclerosis of aorta: Secondary | ICD-10-CM | POA: Diagnosis present

## 2017-05-08 DIAGNOSIS — I110 Hypertensive heart disease with heart failure: Secondary | ICD-10-CM

## 2017-05-08 DIAGNOSIS — Q211 Atrial septal defect: Secondary | ICD-10-CM | POA: Diagnosis not present

## 2017-05-08 DIAGNOSIS — I5042 Chronic combined systolic (congestive) and diastolic (congestive) heart failure: Secondary | ICD-10-CM

## 2017-05-08 DIAGNOSIS — Z7901 Long term (current) use of anticoagulants: Secondary | ICD-10-CM | POA: Insufficient documentation

## 2017-05-08 DIAGNOSIS — Z87891 Personal history of nicotine dependence: Secondary | ICD-10-CM

## 2017-05-08 DIAGNOSIS — I5022 Chronic systolic (congestive) heart failure: Secondary | ICD-10-CM | POA: Insufficient documentation

## 2017-05-08 DIAGNOSIS — G4700 Insomnia, unspecified: Secondary | ICD-10-CM | POA: Diagnosis present

## 2017-05-08 DIAGNOSIS — Z87442 Personal history of urinary calculi: Secondary | ICD-10-CM | POA: Diagnosis not present

## 2017-05-08 DIAGNOSIS — I2510 Atherosclerotic heart disease of native coronary artery without angina pectoris: Secondary | ICD-10-CM | POA: Diagnosis present

## 2017-05-08 DIAGNOSIS — Z8711 Personal history of peptic ulcer disease: Secondary | ICD-10-CM

## 2017-05-08 DIAGNOSIS — D649 Anemia, unspecified: Secondary | ICD-10-CM | POA: Insufficient documentation

## 2017-05-08 DIAGNOSIS — Z79899 Other long term (current) drug therapy: Secondary | ICD-10-CM | POA: Insufficient documentation

## 2017-05-08 DIAGNOSIS — E7800 Pure hypercholesterolemia, unspecified: Secondary | ICD-10-CM | POA: Diagnosis present

## 2017-05-08 DIAGNOSIS — R0902 Hypoxemia: Secondary | ICD-10-CM | POA: Diagnosis present

## 2017-05-08 DIAGNOSIS — I272 Pulmonary hypertension, unspecified: Secondary | ICD-10-CM

## 2017-05-08 DIAGNOSIS — I5033 Acute on chronic diastolic (congestive) heart failure: Secondary | ICD-10-CM | POA: Diagnosis present

## 2017-05-08 DIAGNOSIS — I429 Cardiomyopathy, unspecified: Secondary | ICD-10-CM | POA: Diagnosis present

## 2017-05-08 DIAGNOSIS — J449 Chronic obstructive pulmonary disease, unspecified: Secondary | ICD-10-CM

## 2017-05-08 DIAGNOSIS — Z96651 Presence of right artificial knee joint: Secondary | ICD-10-CM | POA: Diagnosis present

## 2017-05-08 DIAGNOSIS — Z85828 Personal history of other malignant neoplasm of skin: Secondary | ICD-10-CM | POA: Diagnosis not present

## 2017-05-08 DIAGNOSIS — E119 Type 2 diabetes mellitus without complications: Secondary | ICD-10-CM

## 2017-05-08 DIAGNOSIS — I083 Combined rheumatic disorders of mitral, aortic and tricuspid valves: Secondary | ICD-10-CM | POA: Diagnosis present

## 2017-05-08 DIAGNOSIS — I352 Nonrheumatic aortic (valve) stenosis with insufficiency: Secondary | ICD-10-CM

## 2017-05-08 DIAGNOSIS — D509 Iron deficiency anemia, unspecified: Secondary | ICD-10-CM | POA: Diagnosis present

## 2017-05-08 DIAGNOSIS — Z7984 Long term (current) use of oral hypoglycemic drugs: Secondary | ICD-10-CM | POA: Insufficient documentation

## 2017-05-08 DIAGNOSIS — I2721 Secondary pulmonary arterial hypertension: Secondary | ICD-10-CM | POA: Diagnosis present

## 2017-05-08 DIAGNOSIS — R195 Other fecal abnormalities: Secondary | ICD-10-CM | POA: Diagnosis present

## 2017-05-08 DIAGNOSIS — I080 Rheumatic disorders of both mitral and aortic valves: Secondary | ICD-10-CM

## 2017-05-08 DIAGNOSIS — I5043 Acute on chronic combined systolic (congestive) and diastolic (congestive) heart failure: Secondary | ICD-10-CM

## 2017-05-08 DIAGNOSIS — R531 Weakness: Secondary | ICD-10-CM | POA: Diagnosis present

## 2017-05-08 DIAGNOSIS — Z806 Family history of leukemia: Secondary | ICD-10-CM

## 2017-05-08 DIAGNOSIS — Z01810 Encounter for preprocedural cardiovascular examination: Secondary | ICD-10-CM | POA: Diagnosis not present

## 2017-05-08 DIAGNOSIS — I340 Nonrheumatic mitral (valve) insufficiency: Secondary | ICD-10-CM

## 2017-05-08 DIAGNOSIS — I4891 Unspecified atrial fibrillation: Secondary | ICD-10-CM

## 2017-05-08 DIAGNOSIS — I10 Essential (primary) hypertension: Secondary | ICD-10-CM | POA: Diagnosis not present

## 2017-05-08 HISTORY — DX: Unspecified glaucoma: H40.9

## 2017-05-08 HISTORY — DX: Unspecified malignant neoplasm of skin, unspecified: C44.90

## 2017-05-08 HISTORY — DX: Unspecified osteoarthritis, unspecified site: M19.90

## 2017-05-08 HISTORY — DX: Personal history of other medical treatment: Z92.89

## 2017-05-08 HISTORY — DX: Anemia, unspecified: D64.9

## 2017-05-08 HISTORY — DX: Type 2 diabetes mellitus without complications: E11.9

## 2017-05-08 HISTORY — DX: Personal history of urinary calculi: Z87.442

## 2017-05-08 LAB — PROTIME-INR
INR: 1.29
PROTHROMBIN TIME: 16.2 s — AB (ref 11.4–15.2)

## 2017-05-08 LAB — COMPREHENSIVE METABOLIC PANEL
ALBUMIN: 3.4 g/dL — AB (ref 3.5–5.0)
ALT: 11 U/L — ABNORMAL LOW (ref 14–54)
AST: 22 U/L (ref 15–41)
Alkaline Phosphatase: 42 U/L (ref 38–126)
Anion gap: 12 (ref 5–15)
BILIRUBIN TOTAL: 0.9 mg/dL (ref 0.3–1.2)
BUN: 43 mg/dL — AB (ref 6–20)
CHLORIDE: 99 mmol/L — AB (ref 101–111)
CO2: 30 mmol/L (ref 22–32)
Calcium: 9 mg/dL (ref 8.9–10.3)
Creatinine, Ser: 2.17 mg/dL — ABNORMAL HIGH (ref 0.44–1.00)
GFR calc Af Amer: 24 mL/min — ABNORMAL LOW (ref >60)
GFR, EST NON AFRICAN AMERICAN: 20 mL/min — AB (ref >60)
Glucose, Bld: 185 mg/dL — ABNORMAL HIGH (ref 65–99)
POTASSIUM: 3.9 mmol/L (ref 3.5–5.1)
Sodium: 141 mmol/L (ref 135–145)
TOTAL PROTEIN: 6.4 g/dL — AB (ref 6.5–8.1)

## 2017-05-08 LAB — GLUCOSE, CAPILLARY
GLUCOSE-CAPILLARY: 160 mg/dL — AB (ref 65–99)
Glucose-Capillary: 163 mg/dL — ABNORMAL HIGH (ref 65–99)

## 2017-05-08 LAB — CBC
HEMATOCRIT: 27.7 % — AB (ref 36.0–46.0)
Hemoglobin: 8.3 g/dL — ABNORMAL LOW (ref 12.0–15.0)
MCH: 27.2 pg (ref 26.0–34.0)
MCHC: 30 g/dL (ref 30.0–36.0)
MCV: 90.8 fL (ref 78.0–100.0)
Platelets: 188 10*3/uL (ref 150–400)
RBC: 3.05 MIL/uL — ABNORMAL LOW (ref 3.87–5.11)
RDW: 15.4 % (ref 11.5–15.5)
WBC: 6.1 10*3/uL (ref 4.0–10.5)

## 2017-05-08 LAB — BRAIN NATRIURETIC PEPTIDE
B NATRIURETIC PEPTIDE 5: 863.4 pg/mL — AB (ref 0.0–100.0)
B Natriuretic Peptide: 685.7 pg/mL — ABNORMAL HIGH (ref 0.0–100.0)

## 2017-05-08 MED ORDER — FERROUS SULFATE 325 (65 FE) MG PO TABS
325.0000 mg | ORAL_TABLET | Freq: Every day | ORAL | Status: DC
  Administered 2017-05-09 – 2017-05-17 (×9): 325 mg via ORAL
  Filled 2017-05-08 (×9): qty 1

## 2017-05-08 MED ORDER — SODIUM CHLORIDE 0.9 % IV SOLN: 250.0000 mL | INTRAVENOUS | Status: DC | PRN

## 2017-05-08 MED ORDER — ACETAMINOPHEN 325 MG PO TABS: 650.0000 mg | ORAL_TABLET | Freq: Four times a day (QID) | ORAL | Status: DC | PRN

## 2017-05-08 MED ORDER — METFORMIN HCL 500 MG PO TABS: 250.0000 mg | ORAL_TABLET | Freq: Two times a day (BID) | ORAL | Status: DC

## 2017-05-08 MED ORDER — ADULT MULTIVITAMIN W/MINERALS CH
1.0000 | ORAL_TABLET | Freq: Every day | ORAL | Status: DC
  Administered 2017-05-09 – 2017-05-17 (×9): 1 via ORAL
  Filled 2017-05-08 (×9): qty 1

## 2017-05-08 MED ORDER — GLIPIZIDE ER 10 MG PO TB24
10.0000 mg | ORAL_TABLET | Freq: Every day | ORAL | Status: DC
  Administered 2017-05-09 – 2017-05-17 (×6): 10 mg via ORAL
  Filled 2017-05-08 (×7): qty 1

## 2017-05-08 MED ORDER — LATANOPROST 0.005 % OP SOLN
1.0000 [drp] | Freq: Every day | OPHTHALMIC | Status: DC
  Administered 2017-05-08 – 2017-05-16 (×9): 1 [drp] via OPHTHALMIC
  Filled 2017-05-08: qty 2.5

## 2017-05-08 MED ORDER — LINAGLIPTIN 5 MG PO TABS: 5.0000 mg | ORAL_TABLET | Freq: Every day | ORAL | Status: DC

## 2017-05-08 MED ORDER — PANTOPRAZOLE SODIUM 40 MG PO TBEC
40.0000 mg | DELAYED_RELEASE_TABLET | Freq: Every day | ORAL | Status: DC
  Administered 2017-05-09 – 2017-05-17 (×9): 40 mg via ORAL
  Filled 2017-05-08 (×9): qty 1

## 2017-05-08 MED ORDER — CALCIUM CARBONATE-VIT D-MIN 600-400 MG-UNIT PO TABS: 1.0000 | ORAL_TABLET | Freq: Every day | ORAL | Status: DC

## 2017-05-08 MED ORDER — CARVEDILOL 6.25 MG PO TABS
6.2500 mg | ORAL_TABLET | Freq: Two times a day (BID) | ORAL | Status: DC
  Administered 2017-05-08 – 2017-05-11 (×4): 6.25 mg via ORAL
  Filled 2017-05-08 (×5): qty 1

## 2017-05-08 MED ORDER — WARFARIN - PHARMACIST DOSING INPATIENT
Freq: Every day | Status: DC
  Administered 2017-05-10: 18:00:00

## 2017-05-08 MED ORDER — WARFARIN SODIUM 5 MG PO TABS
5.0000 mg | ORAL_TABLET | Freq: Once | ORAL | Status: AC
  Administered 2017-05-08: 5 mg via ORAL
  Filled 2017-05-08: qty 1

## 2017-05-08 MED ORDER — INSULIN ASPART 100 UNIT/ML ~~LOC~~ SOLN
0.0000 [IU] | Freq: Three times a day (TID) | SUBCUTANEOUS | Status: DC
  Administered 2017-05-10: 2 [IU] via SUBCUTANEOUS
  Administered 2017-05-10: 5 [IU] via SUBCUTANEOUS
  Administered 2017-05-11 (×2): 3 [IU] via SUBCUTANEOUS
  Administered 2017-05-12: 2 [IU] via SUBCUTANEOUS
  Administered 2017-05-12 (×2): 5 [IU] via SUBCUTANEOUS
  Administered 2017-05-13: 3 [IU] via SUBCUTANEOUS
  Administered 2017-05-13: 5 [IU] via SUBCUTANEOUS
  Administered 2017-05-14: 3 [IU] via SUBCUTANEOUS
  Administered 2017-05-14: 2 [IU] via SUBCUTANEOUS
  Administered 2017-05-14: 3 [IU] via SUBCUTANEOUS
  Administered 2017-05-15: 5 [IU] via SUBCUTANEOUS
  Administered 2017-05-15: 3 [IU] via SUBCUTANEOUS
  Administered 2017-05-16: 2 [IU] via SUBCUTANEOUS
  Administered 2017-05-16 – 2017-05-17 (×3): 3 [IU] via SUBCUTANEOUS
  Administered 2017-05-17: 8 [IU] via SUBCUTANEOUS

## 2017-05-08 MED ORDER — FUROSEMIDE 10 MG/ML IJ SOLN
80.0000 mg | Freq: Three times a day (TID) | INTRAMUSCULAR | Status: AC
  Administered 2017-05-08 – 2017-05-12 (×11): 80 mg via INTRAVENOUS
  Filled 2017-05-08 (×12): qty 8

## 2017-05-08 MED ORDER — ONDANSETRON HCL 4 MG/2ML IJ SOLN: 4.0000 mg | Freq: Four times a day (QID) | INTRAMUSCULAR | Status: DC | PRN

## 2017-05-08 MED ORDER — TIOTROPIUM BROMIDE MONOHYDRATE 18 MCG IN CAPS
18.0000 ug | ORAL_CAPSULE | Freq: Every day | RESPIRATORY_TRACT | Status: DC
  Administered 2017-05-09 – 2017-05-14 (×2): 18 ug via RESPIRATORY_TRACT
  Filled 2017-05-08: qty 5

## 2017-05-08 MED ORDER — DORZOLAMIDE HCL-TIMOLOL MAL 2-0.5 % OP SOLN
1.0000 [drp] | Freq: Two times a day (BID) | OPHTHALMIC | Status: DC
  Administered 2017-05-08 – 2017-05-17 (×18): 1 [drp] via OPHTHALMIC
  Filled 2017-05-08: qty 10

## 2017-05-08 MED ORDER — CALCIUM CARBONATE-VITAMIN D 500-200 MG-UNIT PO TABS
1.0000 | ORAL_TABLET | Freq: Every day | ORAL | Status: DC
  Administered 2017-05-09 – 2017-05-17 (×9): 1 via ORAL
  Filled 2017-05-08 (×9): qty 1

## 2017-05-08 MED ORDER — MAGNESIUM OXIDE 400 (241.3 MG) MG PO TABS
400.0000 mg | ORAL_TABLET | Freq: Every day | ORAL | Status: DC
  Administered 2017-05-09 – 2017-05-17 (×9): 400 mg via ORAL
  Filled 2017-05-08 (×9): qty 1

## 2017-05-08 MED ORDER — ACETAMINOPHEN 325 MG PO TABS
650.0000 mg | ORAL_TABLET | ORAL | Status: DC | PRN
  Administered 2017-05-08 – 2017-05-11 (×5): 650 mg via ORAL
  Filled 2017-05-08 (×5): qty 2

## 2017-05-08 MED ORDER — ATORVASTATIN CALCIUM 20 MG PO TABS
20.0000 mg | ORAL_TABLET | Freq: Every day | ORAL | Status: DC
  Administered 2017-05-08 – 2017-05-16 (×9): 20 mg via ORAL
  Filled 2017-05-08 (×9): qty 1

## 2017-05-08 NOTE — H&P (Signed)
Advanced Heart Failure Team History and Physical Note   Primary Physician:  Dr Neta Mends  Primary HF Cardiologist:  Dr Sarita Haver   Reason for Admission: A/C Diastolic Heart Failure   HPI:   Destiny [name] is an 51 year with h/o HTN, DMII, chronic a fib, [facility]. Severe MR with failed MV clip, porcelain aorta, copd, anemia, GI bleed 2018 with non bleeding gastric ulcer, AKI, pulmonary htn, and diastolic heart failure.  Today she was evaluated in the HF clinic and complained of increased dyspnea. Last week she received IV lasix and switched to torsemide but has had no improvement. + Orthopnea. SOB with minimal exertion.  Heart rate was in the 130s on arrival but down to 90 after a few minutes. Also had hypoxia on arrival. Also had had dark stools.    ECHO 04/2017  Left ventricle: The cavity size was normal. There was moderate   concentric hypertrophy. Systolic function was normal. The   estimated ejection fraction was in the range of 55% to 60%. Wall   motion was normal; there were no regional wall motion   abnormalities. The study was not technically sufficient to allow   evaluation of LV diastolic dysfunction due to atrial   fibrillation. - Aortic valve: There is moderate to severe AS. The mean AV   gradient is 91mmHg but the calculated AVA is 0.73cm2. Visually   the valve appears moderate to severely stenotic. Valve mobility   was restricted. There was moderate to severe stenosis. There was   moderate to severe regurgitation. Mean gradient (S): 20 mm Hg.   VTI ratio of LVOT to aortic valve: 0.29. Valve area (VTI): 0.73   cm^2. Valve area (Vmax): 0.9 cm^2. Valve area (Vmean): 0.81 cm^2. - Mitral valve: Calcified annulus. There was mild to moderate   regurgitation directed eccentrically and posteriorly. Valve area   by continuity equation (using LVOT flow): 1.38 cm^2. - Left atrium: The atrium was massively dilated. - Right ventricle: The cavity size was mildly dilated. Wall  thickness was normal. - Right atrium: The atrium was massively dilated. - Atrial septum: There was a small secundum atrial septal defect.   There was a left-to-right shunt through an atrial septal defect,   in the baseline state. - Tricuspid valve: There was moderate-severe regurgitation. - Pulmonary arteries: PA peak pressure: 64 mm Hg (S). - Pericardium, extracardiac: A trivial, free-flowing pericardial   effusion was identified along the left ventricular free wall. Impressions: - Compared to prior echo, the aortic insufficiency has worsened, TR   has worsened and pulmonary HTN has increased. Moderate to severe   AS is present. The right ventricular systolic pressure was   increased consistent with moderate pulmonary hypertension  Review of Systems: [y] = yes, [ ]  = no   General: Weight gain [Y ]; Weight loss [ ] ; Anorexia [ ] ; Fatigue [ Y]; Fever [ ] ; Chills [ ] ; Weakness [ ]   Cardiac: Chest pain/pressure [ ] ; Resting SOB [Y ]; Exertional SOB [ Y]; Orthopnea [Y ]; Pedal Edema [ ] ; Palpitations [ ] ; Syncope [ ] ; Presyncope [ ] ; Paroxysmal nocturnal dyspnea[ ]   Pulmonary: Cough [ ] ; Wheezing[ ] ; Hemoptysis[ ] ; Sputum [ ] ; Snoring [ ]   GI: Vomiting[ ] ; Dysphagia[ ] ; Melena[ ] ; Hematochezia [ ] ; Heartburn[ ] ; Abdominal pain [ ] ; Constipation [ ] ; Diarrhea [ ] ; BRBPR [ ]   GU: Hematuria[ ] ; Dysuria [ ] ; Nocturia[ ]   Vascular: Pain in legs with walking [ ] ; Pain in feet with  lying flat [ ] ; Non-healing sores [ ] ; Stroke [ ] ; TIA [ ] ; Slurred speech [ ] ;  Neuro: Headaches[ ] ; Vertigo[ ] ; Seizures[ ] ; Paresthesias[ ] ;Blurred vision [ ] ; Diplopia [ ] ; Vision changes [ ]   Ortho/Skin: Arthritis [ ] ; Joint pain [Y ]; Muscle pain [ ] ; Joint swelling [ ] ; Back Pain [ Y]; Rash [ ]   Psych: Depression[ ] ; Anxiety[ ]   Heme: Bleeding problems [ ] ; Clotting disorders [ ] ; Anemia [ Y]  Endocrine: Diabetes [ Y]; Thyroid dysfunction[ ]    Home Medications Prior to Admission medications   Medication Sig  Start Date End Date Taking? Authorizing Provider  acetaminophen (TYLENOL) 325 MG tablet Take 2 tablets (650 mg total) by mouth every 6 (six) hours as needed for mild pain (or Fever >/= 101). 01/03/17   Regalado, Belkys A, MD  atorvastatin (LIPITOR) 20 MG tablet Take 1 tablet (20 mg total) by mouth daily. 02/18/17   Larey Dresser, MD  Calcium Carbonate-Vit D-Min 600-400 MG-UNIT TABS Take 1 tablet by mouth daily.     [provider]  carvedilol (COREG) 6.25 MG tablet Take 1 tablet (6.25 mg total) by mouth 2 (two) times daily with a meal. 05/02/17   Larey Dresser, MD  Cinnamon 500 MG capsule Take 500 mg by mouth daily.      [provider]  dorzolamide-timolol (COSOPT) 22.3-6.8 MG/ML ophthalmic solution Place 1 drop into both eyes 2 (two) times daily.     [provider]  ferrous sulfate 325 (65 FE) MG tablet Take 325 mg by mouth daily with breakfast.    [provider]  gabapentin (NEURONTIN) 300 MG capsule Take 300 mg by mouth 3 (three) times daily.    [provider]  glipiZIDE (GLUCOTROL XL) 10 MG 24 hr tablet Take 1 tablet (10 mg total) by mouth daily. 10/24/15   Kuneff, Renee A, DO  glucose blood (ACCU-CHEK AVIVA PLUS) test strip Use as instructed 10/28/15   Kuneff, Renee A, DO  latanoprost (XALATAN) 0.005 % ophthalmic solution Place 1 drop into both eyes at bedtime.     [provider]  linagliptin (TRADJENTA) 5 MG TABS tablet Take 5 mg by mouth daily.    [provider]  magnesium oxide (MAG-OX) 400 MG tablet Take 400 mg by mouth daily.    [provider]  metFORMIN (GLUCOPHAGE) 500 MG tablet Take 250 mg by mouth 2 (two) times daily with a meal.    [provider]  Multiple Vitamins-Minerals (MULTIVITAL) tablet Take 1 tablet by mouth daily.      [provider]  Omega-3 Fatty Acids (FISH OIL) 1000 MG CAPS Take 1 capsule by mouth daily.     [provider]  pantoprazole (PROTONIX) 40 MG tablet  Take 1 tablet (40 mg total) by mouth daily. 01/03/17   Regalado, Belkys A, MD  tiotropium (SPIRIVA HANDIHALER) 18 MCG inhalation capsule Place 1 capsule (18 mcg total) into inhaler and inhale daily. 03/01/16   Larey Dresser, MD  torsemide (DEMADEX) 20 MG tablet Take 4 tablets (80 mg total) by mouth 2 (two) times daily. 05/02/17   Larey Dresser, MD  vitamin E 400 UNIT capsule Take 400 Units by mouth daily.      [provider]  warfarin (COUMADIN) 5 MG tablet Take 1 tablet (5 mg total) by mouth daily. Takes 2.5mg  on Monday,wednesday and friday and 5mg  all other days. 01/03/17   Regalado, Cassie Freer, MD    Past Medical History:  Past Medical History:  Diagnosis Date  . Aortic calcification (HCC) 02/20/2016   Involving entire thoracic and abdominal aorta  . Atrial fibrillation ([place]) 1999-diagnosed  . Calculus of kidney   . Cataract    left eye (removed)  . Complication of anesthesia    slow to awaken  . Diabetes mellitus without mention of complication   . Diastolic CHF, chronic ([place]) 12/30/2014  . Edema   . Incidental pulmonary nodule    Results of CT scan:  Lungs/Pleura: There is some progressive scarring and reticulonodular opacity in the left upper lobe and lingula extending to the anterior pleural surface. There is some associated mild traction bronchiectasis in the lingula. Findings are suggestive of postinflammatory/postinfectious changes. Recommend correlation with any active infectious symptoms. CT follow-up would be appro  . Mitral valve insufficiency and aortic valve insufficiency   . Mononeuritis of unspecified site   . Osteoarthrosis, unspecified whether generalized or localized, unspecified site   . Pure hypercholesterolemia   . Unspecified essential hypertension   . Unspecified glaucoma(365.9)     Past Surgical History: Past Surgical History:  Procedure Laterality Date  . ABDOMINAL HYSTERECTOMY  2000   partial  . APPENDECTOMY  1954  . CARDIAC CATHETERIZATION N/A  11/16/2015   Procedure: Right/Left Heart Cath and Coronary Angiography;  Surgeon: Larey Dresser, MD;  Location: [facility];  Service: Cardiovascular;  Laterality: N/A;  . catarct surgery Left   . CYSTOSCOPY W/ RETROGRADES Right 11/26/2012   Procedure: CYSTOSCOPY WITH RETROGRADE PYELOGRAM;  Surgeon: Alexis Frock, MD;  Location[facility];  Service: Urology;  Laterality: Right;  . ESOPHAGOGASTRODUODENOSCOPY (EGD) WITH PROPOFOL N/A 01/03/2017   Procedure: ESOPHAGOGASTRODUODENOSCOPY (EGD) WITH PROPOFOL;  Surgeon: Clarene Essex, MD;  Location: [facility];  Service: Endoscopy;  Laterality: N/A;  . HOLMIUM LASER APPLICATION Right 0/92/3300   Procedure: HOLMIUM LASER APPLICATION;  Surgeon: Alexis Frock, MD;  Location: [facility];  Service: Urology;  Laterality: Right;  . JOINT REPLACEMENT Right 2009   knee  . kidney stone     ? Dr Tammi Klippel  . TEE WITHOUT CARDIOVERSION N/A 12/08/2015   Procedure: TRANSESOPHAGEAL ECHOCARDIOGRAM (TEE);  Surgeon: Larey Dresser, MD;  Location: [place];  Service: Cardiovascular;  Laterality: N/A;  . TONSILLECTOMY  1943  . TRANSESOPHAGEAL ECHOCARDIOGRAM  02/2012    Family History:  Family History  Problem Relation Age of Onset  . Cancer Mother 61       leukemia   . Breast cancer Neg Hx   . Colon cancer Neg Hx     Social History: Social History   Social History  . Marital status: Widowed    Spouse name: N/A  . Number of children: N/A  . Years of education: N/A   Social History Main Topics  . Smoking status: Former Smoker    Quit date: 07/11/1988  . Smokeless tobacco: Never Used  . Alcohol use Yes     Comment: occasional  . Drug use: No  . Sexual activity: No   Other Topics Concern  . Not on file   Social History Narrative   Widow. Lives alone. Attended business college.   Lives in one story home. Has living will.    Walker and/or four pronged cane for assistance.    Former smoker of 29 years. Quit  smoking 1989.   Patient drinks caffeinated beverages, takes a daily vitamin.   Wears her seatbelt, wears dentures, smoke detector located in the home.  Allergies:  No Known Allergies  Objective:    Vital Signs:   BP: ()/()  Arterial Line BP: ()/()    There were no vitals filed for this visit.   Physical Exam     General:  Elderly. Chronically ill.  HEENT: Normal Neck: Supple. JVP to jaw . Carotids 2+ bilat; no bruits. No lymphadenopathy or thyromegaly appreciated. Cor: PMI nondisplaced. Irregular rate & rhythm. RUSB systolic murmur, 2/6 diastolic murmur USB.  Lungs: Clear Abdomen: Soft, nontender, nondistended. No hepatosplenomegaly. No bruits or masses. Good bowel sounds. Extremities: No cyanosis, clubbing, rash, R and LLE 2+ edema Neuro: Alert & oriented x 3, cranial nerves grossly intact. moves all 4 extremities w/o difficulty. Affect pleasant.   Telemetry   A fib 90s   EKG   A fib 96 bpm  Labs     Basic Metabolic Panel:  Recent Labs Lab 05/02/17 1413 05/08/17 1247  NA 141 141  K 4.4 3.9  CL 103 99*  CO2 28 30  GLUCOSE 99 185*  BUN 52* 43*  CREATININE 1.97* 2.17*  CALCIUM 9.1 9.0    Liver Function Tests:  Recent Labs Lab 05/08/17 1247  AST 22  ALT 11*  ALKPHOS 42  BILITOT 0.9  PROT 6.4*  ALBUMIN 3.4*   No results for input(s): LIPASE, AMYLASE in the last 168 hours. No results for input(s): AMMONIA in the last 168 hours.  CBC:  Recent Labs Lab 05/02/17 1413 05/08/17 1247  WBC 5.8 6.1  HGB 8.3* 8.3*  HCT 27.6* 27.7*  MCV 92.0 90.8  PLT 182 188    Cardiac Enzymes: No results for input(s): CKTOTAL, CKMB, CKMBINDEX, TROPONINI in the last 168 hours.  BNP: BNP (last 3 results)  Recent Labs  05/23/16 1530 11/02/16 1228 05/08/17 1247  BNP 250.7* 324.3* 685.7*    ProBNP (last 3 results) No results for input(s): PROBNP in the last 8760 hours.   CBG: No results for input(s): GLUCAP in the last 168  hours.  Coagulation Studies: No results for input(s): LABPROT, INR in the last 72 hours.  Imaging:  No results found.   Patient Profile   Destiny Patton is an 82 year old with history of HTN, DMII, chronic a fib, NIMC. Severe MR with failed MV clip, porcelain aorta, copd, anemia, GI bleed 2018 with non bleeding gastric ulcer, AKI, pulmonary htn, and diastolic heart failure admitted with volume overload possibly from valvular heart disease.  Assessment/Plan   1. A/C Diastolic Heart Failure- Possibly in the setting of worsening valvular disease. ECHO 04/2017 EF 55%. Marked volume overload. Diuresed with 80 mg IV lasix every 8 hours.  Follow renal function closely.  2. AS--Severe AI- ECHO the end of July suggestive of of mod-severe AS.  Set up  TEE on Friday once diuresed 3. Chronic A fib- continue home dose of coreg. Pharmacy to dose coumadin.  4. MR- Suspected rupture of an anterior leaflet chord with very eccentric mitral regurgitation.  MR was severe on 3/17 TEE.   Was seen for surgery eval for MV repair and AoV replacement.  However, given porcelain aorta, she was deemed not a candidate for open heart surgery. She was sent for percutaneous MV clipping.  This was attempted by Dr Bridgette Habermann at [facility] but procedure failed due to the pattern of MV calcification.  Most recent echo in 7/18 showed mild to moderate MR.   5. COPD- continue spiriva 6. Anemia- Hgb 8.3 Check iron studies. May need feraheme 7. DMII- Creatinine elevated. Will need  to hold metformin.  8. AKI- Follow BMETs daily.    Darrick Grinder, NP 05/08/2017, 1:50 PM  Advanced Heart Failure Team Pager [phone] (M-F; 7a - 4p)  Please contact [facility] for night-coverage after hours (4p -7a ) and weekends on [email or web address]

## 2017-05-08 NOTE — Progress Notes (Signed)
Patient ID: Destiny [name], female   DOB: 1935-01-01, 82 y.o.   MRN: [id number] PCP: Dr Neta Mends Cardiology: Dr. Johnsie Cancel HF Cardiology: Dr. Ralph Leyden is an 82 y.o. female  with PMH of HTN, DM type 2, Chronic Afib on Coumadin, Chronic systolic CHF due to NICM, Severe MR s/p failed MV clip, porcelain aorta, COPD, and at least moderate AS.   Given increased dyspnea, echo was done in 3/16 which showed EF 30-35%, newly decreased.  Pt had [facility] 11/2015 with no significant CAD and normal R/L filling pressures. Mild PAH. Echo 11/2015 LVEF 40-45% with Mod/severe aortic stenosis.   TEE in 3/17 with EF 45% with possible low gradient at least moderate AS and possible anterior mitral leaflet chordal rupture with concern for very eccentric, severe mitral regurgitation.  Saw Dr. Roxy Manns for cardiac surgery evaluation.  Initial plan was repair of mitral valve and replacement of aortic valve.  However, she was found on CTA chest to have a porcelain aorta and was thought to be too high risk for open heart surgery.    Pt has been seen at [facility] and mitral valve clip was attempted.  This failed due to significant calcification of the valve and apparatus.    Pt admitted 3/28 -> 01/03/17 with weakness and dizziness and found to have Hgb 6.5 with FOBT +. Pt underwent EGD with non-bleeding gastric ulcer. Biopsy performed.  Follow up with GI made.  HF team consulted for resumption of her HF meds with AKI.   Echo was done in 3/18 with EF up to 60-65%, moderate AS, and only trivial MR noted.    Echo (7/18) with EF 55-60%, moderate LVH, moderate-severe AS (mean 20 but AVA 0.73 cm^2 and visually severe), moderate to severe AI, mild-moderate MR, small secundum ASD with left to right shunt, moderate to severe TR, PASP 64 mmHg.   At last appointment (last week), weight was up and she was feeling terrible.  She was volume overloaded on exam with orthopnea and dyspnea with minimal exertion.  I gave her IV Lasix in the  clinic and changed her diuretic to torsemide. Today, she still feels badly, dyspnea with any exertion.  Prominent orthopnea.  No chest pain.  Symptoms have been present for about 3 weeks now.  SBP 88 today though she denies lightheadedness.  No falls.  HR initially 130s afib when she walked in but settled down to 90s at rest.  Oxygen saturation 86-88% when she walked in.      Also of note, she recently had a colonoscopy apparently with some polyps but no bleeding source.  She still has dark stool and hemoglobin has been low.    Creatinine up to 2.17 earlier this week.   ECG (personally reviewed): atrial fibrillation at 96, left axis, nonspecific T wave flattening  Review of systems complete and found to be negative unless listed in HPI.    Labs (1/17): K 3.9, creatinine 0.63, BNP 198 Labs (2/17): K 4.4, creatinine 0.69 Labs (3/17): K 4.8, creatinine 0.78 Labs (5/17): K 4.1, creatinine 0.88, BNP 259 Labs (8/17): K 4.2, creatinine 1.2 => 1.19, BNP 251 Labs (10/17): K 4.3, creatinine 1.22, hgb 11.8 Labs (3/18): K 4.2, creatinine 1.67, hgb 8.6 Labs (7/18): K 4.5 => 4.8, creatinine 1.64 => 1.97 => 2.17, hgb 8.3 => 8.7  PMH: 1. HTN 2. DM type II 3. Chronic atrial fibrillation 4. Right TKR 12/10.  5. Chronic systolic CHF:  - Remote cath showed no significant CAD> -  Echo (3/15) with EF 60-65%.  - Echo (3/16) with EF 30-35%, mild LVH, mild aortic stenosis mean gradient 13 mmHg, mild MR, RV mildly dilated with normal systolic function, moderate TR.  - LHC/RHC (2/17): minimal luminal irregularities in coronaries.  Mean RA 4, PA 57/22, PCWP mean 15, PVR 2.7 WU, CI 3.29. Peak-to-peak aortic valve gradient 21 mmHg.  - Echo (2/17) with EF 40-45%, moderate LV hypertrophy, mild MR, normal RV size and systolic function, PASP 45 mmHg, moderate TR, moderate-severe aortic stenosis with mean gradient 30 mmHg, peak 78 mmHg, AVA 0.8 cm^2.  - TEE (3/17) with EF 45%, severely calcified aortic valve with mean  gradient 16 mmHg but AVA 0.7 cm^2 (aortic gradient likely underestimated based on 2/17 TTE => ?low gradient severe AS versus pseudostenosis), possible anterior mitral leaflet chordal rupture with very eccentric, posteriorly-directed mitral regurgitation (Coanda effect).   - Echo (3/18) with EF 60-65%, moderate LVH, moderate AS, trivial MR, severe biatrial enlargement, small secundum ASD, PASP 45 mmHg.  - Echo (7/18) with EF 55-60%, moderate LVH, moderate-severe AS (mean 20 but AVA 0.73 cm^2 and visually severe), moderate to severe AI, mild-moderate MR, small secundum ASD with left to right shunt, moderate to severe TR, PASP 64 mmHg.  6. Aortic valve disease: Moderate AS on 3/18 echo.  Echo 7/18 looked worse with moderate to severe AS and moderate to severe AI.  7. Mitral regurgitation: Appeared severe by TEE with possible chordal rupture in 3/17.  MV clip attempted at [facility], failed. However, with improvement of EF, only trivial MR on 3/18 echo. Mild to moderate MR on 7/18 echo.  8. Porcelain aorta. 9. COPD: PFTs (4/17) with FVC 55%, FEV1 49%, radio 89%, DLCO 50% => moderate to severe obstructive lung disease.  10. Upper GI bleed: 3/18, gastric ulcer found on EGD.  88. Small secundum ASD.   FH: Atrial fibrillation.  No CAD.  SH: Retired, widow, lives in [place], quit smoking 1989, originally from [place], has 7 kids.    Current Outpatient Prescriptions  Medication Sig Dispense Refill  . acetaminophen (TYLENOL) 325 MG tablet Take 2 tablets (650 mg total) by mouth every 6 (six) hours as needed for mild pain (or Fever >/= 101). 30 tablet 0  . atorvastatin (LIPITOR) 20 MG tablet Take 1 tablet (20 mg total) by mouth daily. 90 tablet 3  . Calcium Carbonate-Vit D-Min 600-400 MG-UNIT TABS Take 1 tablet by mouth daily.     . carvedilol (COREG) 6.25 MG tablet Take 1 tablet (6.25 mg total) by mouth 2 (two) times daily with a meal. 60 tablet 6  . Cinnamon 500 MG capsule Take 500 mg by mouth daily.      .  dorzolamide-timolol (COSOPT) 22.3-6.8 MG/ML ophthalmic solution Place 1 drop into both eyes 2 (two) times daily.     . ferrous sulfate 325 (65 FE) MG tablet Take 325 mg by mouth daily with breakfast.    . gabapentin (NEURONTIN) 300 MG capsule Take 300 mg by mouth 3 (three) times daily.    Marland Kitchen glipiZIDE (GLUCOTROL XL) 10 MG 24 hr tablet Take 1 tablet (10 mg total) by mouth daily. 90 tablet 1  . glucose blood (ACCU-CHEK AVIVA PLUS) test strip Use as instructed 100 each 11  . latanoprost (XALATAN) 0.005 % ophthalmic solution Place 1 drop into both eyes at bedtime.     Marland Kitchen linagliptin (TRADJENTA) 5 MG TABS tablet Take 5 mg by mouth daily.    . magnesium oxide (MAG-OX) 400 MG tablet  Take 400 mg by mouth daily.    . metFORMIN (GLUCOPHAGE) 500 MG tablet Take 250 mg by mouth 2 (two) times daily with a meal.    . Multiple Vitamins-Minerals (MULTIVITAL) tablet Take 1 tablet by mouth daily.      . Omega-3 Fatty Acids (FISH OIL) 1000 MG CAPS Take 1 capsule by mouth daily.     . pantoprazole (PROTONIX) 40 MG tablet Take 1 tablet (40 mg total) by mouth daily. 30 tablet 0  . sacubitril-valsartan (ENTRESTO) 24-26 MG Take 1 tablet by mouth 2 (two) times daily. 60 tablet 11  . spironolactone (ALDACTONE) 25 MG tablet Take 0.5 tablets (12.5 mg total) by mouth daily. 15 tablet 3  . tiotropium (SPIRIVA HANDIHALER) 18 MCG inhalation capsule Place 1 capsule (18 mcg total) into inhaler and inhale daily. 30 capsule 3  . torsemide (DEMADEX) 20 MG tablet Take 4 tablets (80 mg total) by mouth 2 (two) times daily. 120 tablet 3  . vitamin E 400 UNIT capsule Take 400 Units by mouth daily.      Marland Kitchen warfarin (COUMADIN) 5 MG tablet Take 1 tablet (5 mg total) by mouth daily. Takes 2.5mg  on Monday,wednesday and friday and 5mg  all other days. 30 tablet 0   No current facility-administered medications for this encounter.    BP (!) 88/62 (BP Location: Right Arm, Patient Position: Sitting, Cuff Size: Normal)   Pulse (!) 130   Wt 168 lb  12.8 oz (76.6 kg)   SpO2 (!) 86%   BMI 32.97 kg/m    Wt Readings from Last 3 Encounters:  05/08/17 168 lb 12.8 oz (76.6 kg)  05/02/17 172 lb 12.8 oz (78.4 kg)  01/25/17 157 lb 1.9 oz (71.3 kg)   General: NAD Neck: JVP 14-15 cm, no thyromegaly or thyroid nodule.  Lungs: Crackles at bases bilaterally CV: Nondisplaced PMI.  Heart irregular S1/S2, S2 muffled by murmur.  3/6 crescendo-descrescendo systolic murmur RUSB, 2/6 early diastolic murmur USB. 2+ edema to knees.  No carotid bruit.  Cannot palpate pedal pulses with edema.   Abdomen: Soft, nontender, no hepatosplenomegaly, mild distention.  Skin: Intact without lesions or rashes.  Neurologic: Alert and oriented x 3.  Psych: Normal affect. Extremities: No clubbing or cyanosis.  HEENT: Normal.   Assessment/Plan: 1. Acute on chronic diastolic CHF: Echo (4/09) with EF 30-35%, diffuse hypokinesis, improved to 45% on 3/17 TEE and then to 60-65% on 3/18 echo. EF remains normal at 55-60% on echo done in 7/18.  Nonischemic cardiomyopathy, no significant coronary disease on angiography. Acute decompensation may be related to her valvular disease with moderate to severe AS and moderate to severe AI.  Today, she remains volume overloaded despite medication adjustment and BP is low with low oxygen saturation.    - She will need admission to the hospital.  Will admit to step-down, start Lasix 80 mg IV every 8 hrs.  - Sending labs: CMET, BNP.  - With low BP and normal EF, stop Entresto and spironolactone.  - Continue Coreg 6.25 mg bid for rate control.  - Place ted hose.  2. Atrial fibrillation: Chronic.  Rate was up when she walked in but now in 90s. Likely driven by volume overload/hypoxemia.  - Continue warfarin, check INR.  - Continue Coreg.  3. Aortic stenosis/regurgitation: On 2/17 echo, severe aortic stenosis by AVA, moderate by mean gradient.  By cath, suspect moderate AS given relative ease of crossing valve.  TEE showed low gradient  probably moderate aortic stenosis.   Echo  in 3/18 with moderate AS. However, most recent echo in 7/18 is suggestive of moderate-severe AS (mean gradient 20 but looks severe visually and AVA 0.7 cm^2) and moderate-severe AI.  Worsening valvular disease may have caused HF decompensation.   - She would be a TAVR candidate.  She will need TEE when stable to more closely assess the valve.     4. Pulmonary hypertension: Mild to moderate pulmonary hypertension on RHC but PVR not significantly elevated, suspect pulmonary venous hypertension.  5. Mitral regurgitation: Suspected rupture of an anterior leaflet chord with very eccentric mitral regurgitation.  MR was severe on 3/17 TEE.   Was seen for surgery eval for MV repair and AoV replacement.  However, given porcelain aorta, she was deemed not a candidate for open heart surgery. She was sent for percutaneous MV clipping.  This was attempted by Dr Bridgette Habermann at [facility] but procedure failed due to the pattern of MV calcification.  Most recent echo in 7/18 showed mild to moderate MR.   6. COPD: Moderate to severe by PFTs. - Continue Spiriva. No change to current medications.   7. Anemia: Upper GI bleed in 3/18, possibly from gastric ulcer.  She is back on warfarin.  Recent colonoscopy with no bleeding source. Stool is still dark, hemoglobin is stably low.  - Check iron studies today, give IV Fe if low.  8. Type II diabetes: With CHF, would avoid use of linagliptin.   9. AKI: Creatinine up in setting of low BP and volume overload.  Will need to diurese and also back off on BP-active meds.  Follow closely.   As above, admit to step-down.   Loralie Champagne, MD  05/08/2017

## 2017-05-08 NOTE — Progress Notes (Signed)
ANTICOAGULATION CONSULT NOTE - Follow Up Consult  Pharmacy Consult for Coumadin Indication: atrial fibrillation  No Known Allergies  Patient Measurements: Height: 5' (152.4 cm) Weight: 168 lb 3.2 oz (76.3 kg) IBW/kg (Calculated) : 45.5  Vital Signs: Temp: 98 F (36.7 C) (08/01 1530) Temp Source: Oral (08/01 1530) BP: 114/90 (08/01 1530) Pulse Rate: 107 (08/01 1530)  Labs:  Recent Labs  05/08/17 1247 05/08/17 1612  HGB 8.3*  --   HCT 27.7*  --   PLT 188  --   LABPROT  --  16.2*  INR  --  1.29  CREATININE 2.17*  --     Estimated Creatinine Clearance: 18.9 mL/min (A) (by C-G formula based on SCr of 2.17 mg/dL (H)).  Assessment: Destiny [name] on coumadin for afib. INR 1.29 on admit. Patient tells me she had been off of her coumadin for a colonoscopy that was done last Wednesday and she just resumed taking it yesterday. Hgb low 8.3 but has hx anemia.  PTA dose: 5mg  daily except 2.5mg  Mon/Fri  Goal of Therapy:  INR 2-3 Monitor platelets by anticoagulation protocol: Yes   Plan:  1) Coumadin 5mg  tonight 2) Daily INR  Deboraha Sprang 05/08/2017,5:54 PM

## 2017-05-09 DIAGNOSIS — I5033 Acute on chronic diastolic (congestive) heart failure: Secondary | ICD-10-CM

## 2017-05-09 DIAGNOSIS — N179 Acute kidney failure, unspecified: Secondary | ICD-10-CM

## 2017-05-09 LAB — BASIC METABOLIC PANEL
Anion gap: 7 (ref 5–15)
BUN: 42 mg/dL — AB (ref 6–20)
CALCIUM: 8.9 mg/dL (ref 8.9–10.3)
CO2: 35 mmol/L — AB (ref 22–32)
CREATININE: 2.04 mg/dL — AB (ref 0.44–1.00)
Chloride: 102 mmol/L (ref 101–111)
GFR calc non Af Amer: 22 mL/min — ABNORMAL LOW (ref >60)
GFR, EST AFRICAN AMERICAN: 25 mL/min — AB (ref >60)
Glucose, Bld: 133 mg/dL — ABNORMAL HIGH (ref 65–99)
Potassium: 3.7 mmol/L (ref 3.5–5.1)
SODIUM: 144 mmol/L (ref 135–145)

## 2017-05-09 LAB — GLUCOSE, CAPILLARY
GLUCOSE-CAPILLARY: 165 mg/dL — AB (ref 65–99)
Glucose-Capillary: 121 mg/dL — ABNORMAL HIGH (ref 65–99)
Glucose-Capillary: 103 mg/dL — ABNORMAL HIGH (ref 65–99)
Glucose-Capillary: 68 mg/dL (ref 65–99)

## 2017-05-09 LAB — PROTIME-INR
INR: 1.37
PROTHROMBIN TIME: 17 s — AB (ref 11.4–15.2)

## 2017-05-09 LAB — MRSA PCR SCREENING: MRSA by PCR: NEGATIVE

## 2017-05-09 MED ORDER — WARFARIN SODIUM 7.5 MG PO TABS
7.5000 mg | ORAL_TABLET | Freq: Once | ORAL | Status: AC
  Administered 2017-05-09: 7.5 mg via ORAL
  Filled 2017-05-09: qty 1

## 2017-05-09 MED ORDER — METOLAZONE 5 MG PO TABS
2.5000 mg | ORAL_TABLET | Freq: Once | ORAL | Status: AC
  Administered 2017-05-09: 2.5 mg via ORAL
  Filled 2017-05-09: qty 1

## 2017-05-09 MED ORDER — POTASSIUM CHLORIDE CRYS ER 20 MEQ PO TBCR
40.0000 meq | EXTENDED_RELEASE_TABLET | Freq: Once | ORAL | Status: AC
  Administered 2017-05-09: 40 meq via ORAL
  Filled 2017-05-09: qty 2

## 2017-05-09 NOTE — Progress Notes (Signed)
ANTICOAGULATION CONSULT NOTE - Follow Up Consult  Pharmacy Consult for Coumadin Indication: atrial fibrillation  No Known Allergies  Patient Measurements: Height: 5' (152.4 cm) Weight: 166 lb 1.6 oz (75.3 kg) IBW/kg (Calculated) : 45.5  Vital Signs: Temp: 98.4 F (36.9 C) (08/02 1134) Temp Source: Oral (08/02 1134) BP: 102/69 (08/02 1134) Pulse Rate: 91 (08/02 1134)  Labs:  Recent Labs  05/08/17 1247 05/08/17 1612 05/09/17 0029  HGB 8.3*  --   --   HCT 27.7*  --   --   PLT 188  --   --   LABPROT  --  16.2* 17.0*  INR  --  1.29 1.37  CREATININE 2.17*  --  2.04*    Estimated Creatinine Clearance: 19.9 mL/min (A) (by C-G formula based on SCr of 2.04 mg/dL (H)).  Assessment: 82yof on Coumadin for afib. INR 1.29 on admit. Patient tells me she had been off of her coumadin for a colonoscopy that was done last Wednesday and she just resumed taking it 7/31. Hgb low 8.3 but has hx anemia.  PTA dose: 5mg  daily except 2.5mg  Mon/Fri  No overt bleeding or complications noted.  Goal of Therapy:  INR 2-3 Monitor platelets by anticoagulation protocol: Yes   Plan:  1) Coumadin 7.5 mg tonight 2) Daily INR  Uvaldo Rising, BCPS  Clinical Pharmacist Pager [phone]  05/09/2017 1:33 PM

## 2017-05-09 NOTE — Progress Notes (Signed)
Advanced Heart Failure Rounding Note  PCP: Dr Neta Mends  Primary Cardiologist: Dr Aundra Dubin  Subjective:    Admitted with volume overload.   Started on IV diuresis, weight down 2 pounds overnight, urine output 900 ml after 2 doses of lasix.   Feels SOB at rest, worse with exertion. Denies orthopnea, chest pain.     Objective:   Weight Range: 166 lb 1.6 oz (75.3 kg) Body mass index is 32.44 kg/m.   Vital Signs:   Temp:  [98 F (36.7 C)-98.7 F (37.1 C)] 98.7 F (37.1 C) (08/02 0802) Pulse Rate:  [63-107] 94 (08/02 0802) Resp:  [17-19] 18 (08/02 0802) BP: (84-114)/(50-90) 98/65 (08/02 0802) SpO2:  [94 %-99 %] 99 % (08/02 0825) Weight:  [166 lb 1.6 oz (75.3 kg)-168 lb 3.2 oz (76.3 kg)] 166 lb 1.6 oz (75.3 kg) (08/02 0500)    Weight change: Filed Weights   05/08/17 1530 05/09/17 0500  Weight: 168 lb 3.2 oz (76.3 kg) 166 lb 1.6 oz (75.3 kg)    Intake/Output:   Intake/Output Summary (Last 24 hours) at 05/09/17 0830 Last data filed at 05/09/17 0530  Gross per 24 hour  Intake              120 ml  Output              900 ml  Net             -780 ml      Physical Exam    General: Elderly female, NAD.  HEENT: Normal Neck: Supple. JVP to ear  . Carotids 2+ bilat; no bruits. No lymphadenopathy or thyromegaly appreciated. Cor: PMI nondisplaced. Irregular rate and rhythm. No rubs, gallops or murmurs. Lungs: Clear bilaterally. Diminished in bases.  Abdomen: Soft, nontender, nondistended. No hepatosplenomegaly. No bruits or masses. Good bowel sounds. Extremities: No cyanosis, clubbing, rash, 2+ edema to knees  Neuro: Alert & orientedx3, cranial nerves grossly intact. moves all 4 extremities w/o difficulty. Affect pleasant   Telemetry   Afib, 90-100's - personally reviewed.   EKG    Afbi, 96 bpm - personally reviewed.   Labs    CBC  Recent Labs  05/08/17 1247  WBC 6.1  HGB 8.3*  HCT 27.7*  MCV 90.8  PLT 657   Basic Metabolic Panel  Recent Labs  05/08/17 1247 05/09/17 0029  NA 141 144  K 3.9 3.7  CL 99* 102  CO2 30 35*  GLUCOSE 185* 133*  BUN 43* 42*  CREATININE 2.17* 2.04*  CALCIUM 9.0 8.9   Liver Function Tests  Recent Labs  05/08/17 1247  AST 22  ALT 11*  ALKPHOS 42  BILITOT 0.9  PROT 6.4*  ALBUMIN 3.4*    BNP: BNP (last 3 results)  Recent Labs  11/02/16 1228 05/08/17 1247 05/08/17 1612  BNP 324.3* 685.7* 863.4*       Imaging   Transthoracic Echocardiography 05/03/17   Study Conclusions  - Left ventricle: The cavity size was normal. There was moderate   concentric hypertrophy. Systolic function was normal. The   estimated ejection fraction was in the range of 55% to 60%. Wall   motion was normal; there were no regional wall motion   abnormalities. The study was not technically sufficient to allow   evaluation of LV diastolic dysfunction due to atrial   fibrillation. - Aortic valve: There is moderate to severe AS. The mean AV   gradient is 61mmHg but the calculated AVA is 0.73cm2. Visually  the valve appears moderate to severely stenotic. Valve mobility   was restricted. There was moderate to severe stenosis. There was   moderate to severe regurgitation. Mean gradient (S): 20 mm Hg.   VTI ratio of LVOT to aortic valve: 0.29. Valve area (VTI): 0.73   cm^2. Valve area (Vmax): 0.9 cm^2. Valve area (Vmean): 0.81 cm^2. - Mitral valve: Calcified annulus. There was mild to moderate   regurgitation directed eccentrically and posteriorly. Valve area   by continuity equation (using LVOT flow): 1.38 cm^2. - Left atrium: The atrium was massively dilated. - Right ventricle: The cavity size was mildly dilated. Wall   thickness was normal. - Right atrium: The atrium was massively dilated. - Atrial septum: There was a small secundum atrial septal defect.   There was a left-to-right shunt through an atrial septal defect,   in the baseline state. - Tricuspid valve: There was moderate-severe  regurgitation. - Pulmonary arteries: PA peak pressure: 64 mm Hg (S). - Pericardium, extracardiac: A trivial, free-flowing pericardial   effusion was identified along the left ventricular free wall.  Impressions:  - Compared to prior echo, the aortic insufficiency has worsened, TR   has worsened and pulmonary HTN has increased. Moderate to severe   AS is present. The right ventricular systolic pressure was   increased consistent with moderate pulmonary hypertension.    Medications:     Scheduled Medications: . atorvastatin  20 mg Oral q1800  . calcium-vitamin D  1 tablet Oral Q breakfast  . carvedilol  6.25 mg Oral BID WC  . dorzolamide-timolol  1 drop Both Eyes BID  . ferrous sulfate  325 mg Oral Q breakfast  . furosemide  80 mg Intravenous Q8H  . glipiZIDE  10 mg Oral Q breakfast  . insulin aspart  0-15 Units Subcutaneous TID WC  . latanoprost  1 drop Both Eyes QHS  . magnesium oxide  400 mg Oral Daily  . multivitamin with minerals  1 tablet Oral Daily  . pantoprazole  40 mg Oral Daily  . tiotropium  18 mcg Inhalation Daily  . Warfarin - Pharmacist Dosing Inpatient   Does not apply q1800     Infusions: . sodium chloride       PRN Medications:  sodium chloride, acetaminophen, ondansetron (ZOFRAN) IV    Patient Profile   Destiny [name] is an 82 year old with history of HTN, DMII, chronic a fib, NIMC. Severe MR with failed MV clip, porcelain aorta, copd, anemia, GI bleed 2018 with non bleeding gastric ulcer, AKI, pulmonary htn, and diastolic heart failure admitted with volume overload. Recent Echo with worsening AS.   Assessment/Plan   1. A/C Diastolic Heart Failure - Possibly from worsening valvular disease.  - NYHA IIIb - Urine output sluggish, continue IV Lasix 80 mg q 8 hours. Add 2.5 mg metolazone today.  - Continue Coreg 6.25 mg BID (needs rate control).    2. AS: Severe AI,  ECHO the end of July suggestive of of mod-severe AS.  - Will plan for TEE  tomorrow to assess.   3. Chronic A fib - rate controlled.  - Continue warfarin for anticoagulation.   4. MR- Suspected rupture of an anterior leaflet chord with very eccentric mitral regurgitation. MR was severe on 3/17 TEE. Was seen for surgery eval for MV repair and AoV replacement. However, given porcelain aorta, she was deemed not a candidate for open heart surgery. She was sent for percutaneous MV clipping. This was attempted by Dr Bridgette Habermann at  [facility] but procedure failed due to the pattern of MV calcification. Most recent echo in 7/18 showed mild to moderate MR.  - No change, for TEE tomorrow.   5. COPD - Stable, no wheezing. Continue Spiriva   6. Anemia - iron studies pending.   7. DMII - continue SSI  8. AKI - Creatinine improved with diuresis.    Length of Stay: [place], NP  05/09/2017, 8:30 AM  Advanced Heart Failure Team Pager [phone] (M-F; [place])  Please contact [facility] for night-coverage after hours (4p -7a ) and weekends on [email or web address]  1. Acute on chronic diastolic CHF: Echo (5/46) with EF 30-35%, diffuse hypokinesis, improved to 45% on 3/17 TEE and then to 60-65% on 3/18 echo. EF remains normal at 55-60% on echo done in 7/18.  Nonischemic cardiomyopathy, no significant coronary disease on angiography. Acute decompensation may be related to her valvular disease with moderate to severe AS and moderate to severe AI.  She was admitted 8/1 with hypotension, AKI and volume overload.  She feels better today, some diuresis, creatinine lower.  SBP still 90s.  - Continue Lasix 80 mg IV every 8 hrs and add metolazone 2.5 x 1.  - With low BP and normal EF, stopped Entresto and spironolactone.  - Continue Coreg 6.25 mg bid for rate control.  - Place ted hose.  2. Atrial fibrillation: Chronic.  Rate is ok.  - Continue warfarin - Continue Coreg.  3. Aortic stenosis/regurgitation: On 2/17 echo, severe aortic stenosis by AVA, moderate by mean gradient.  By  cath at that time, suspected moderate AS given relative ease of crossing valve.  TEE at that time showed low gradient probably moderate aortic stenosis.   Echo in 3/18 with moderate AS. However, most recent echo in 7/18 is suggestive of moderate-severe AS (mean gradient 20 but looks severe visually and AVA 0.7 cm^2) and moderate-severe AI.  Worsening valvular disease may have caused HF decompensation.   - She would be a TAVR candidate.  Plan TEE to more closely assess AoV and MV tomorrow if she remains stable.     4. Pulmonary hypertension: Mild to moderate pulmonary hypertension on RHC but PVR not significantly elevated, suspect pulmonary venous hypertension.  5. Mitral regurgitation: Suspected rupture of an anterior leaflet chord with very eccentric mitral regurgitation.  MR was severe on 3/17 TEE.   Was seen for surgery eval for MV repair and AoV replacement.  However, given porcelain aorta, she was deemed not a candidate for open heart surgery. She was sent for percutaneous MV clipping.  This was attempted by Dr Bridgette Habermann at [facility] but procedure failed due to the pattern of MV calcification.  Most recent echo in 7/18 showed mild to moderate MR.   6. COPD: Moderate to severe by PFTs. - Continue Spiriva. No change to current medications.   7. Anemia: Upper GI bleed in 3/18, possibly from gastric ulcer.  She is back on warfarin.  Recent colonoscopy with no bleeding source. Stool is still dark, hemoglobin is stably low.  - Check iron studies, give IV Fe if low.  8. Type II diabetes: With CHF, would avoid use of linagliptin.   9. AKI: Creatinine up in setting of low BP and volume overload.  Will need to diurese and also back off on BP-active meds.  Follow closely => lower today.   Loralie Champagne 05/09/2017 2:06 PM

## 2017-05-09 NOTE — Progress Notes (Addendum)
Pt had 5 beat vtach, asymptomatic. Cont to monitor. Carroll Kinds RN

## 2017-05-09 NOTE — Plan of Care (Signed)
Problem: Activity: Goal: Capacity to carry out activities will improve Outcome: Progressing Pt very fatigued and weak. Transferred to [facility] w/ 1 assist and walker. Denies any overt SOB or dyspnea. Pain managed utilizing PRN medication.  Problem: Cardiac: Goal: Ability to achieve and maintain adequate cardiopulmonary perfusion will improve Outcome: Progressing Pt remains in A-Fib. SBO 80-90. Pt denies any Chest pain or dizziness. Currently on 2L [place]. SpO2 >94. Pt still has Bilat LE edema. Legs elevated. Lasix given. Pt had 500 of output over night.

## 2017-05-09 NOTE — Plan of Care (Signed)
Problem: Physical Regulation: Goal: Ability to maintain clinical measurements within normal limits will improve Outcome: Progressing Pt diuresing with lasix, decreased sob, cont to monitor

## 2017-05-09 NOTE — Progress Notes (Signed)
Patient scheduled for TEE on 05/10/17 at 15:00 with Dr. Aundra Dubin.  Orders placed.

## 2017-05-10 ENCOUNTER — Encounter (HOSPITAL_COMMUNITY): Payer: Self-pay | Admitting: *Deleted

## 2017-05-10 ENCOUNTER — Inpatient Hospital Stay (HOSPITAL_COMMUNITY): Payer: Medicare HMO

## 2017-05-10 ENCOUNTER — Ambulatory Visit (HOSPITAL_COMMUNITY): Admit: 2017-05-10 | Payer: Medicare HMO | Admitting: Cardiology

## 2017-05-10 ENCOUNTER — Encounter (HOSPITAL_COMMUNITY)
Admission: AD | Disposition: A | Discharge status: Home Health | Payer: Self-pay | Source: Ambulatory Visit | Attending: Cardiology

## 2017-05-10 DIAGNOSIS — I350 Nonrheumatic aortic (valve) stenosis: Secondary | ICD-10-CM

## 2017-05-10 HISTORY — PX: TEE WITHOUT CARDIOVERSION: SHX5443

## 2017-05-10 LAB — BASIC METABOLIC PANEL
Anion gap: 10 (ref 5–15)
BUN: 39 mg/dL — AB (ref 6–20)
CALCIUM: 9 mg/dL (ref 8.9–10.3)
CHLORIDE: 100 mmol/L — AB (ref 101–111)
CO2: 33 mmol/L — ABNORMAL HIGH (ref 22–32)
CREATININE: 1.8 mg/dL — AB (ref 0.44–1.00)
GFR calc non Af Amer: 25 mL/min — ABNORMAL LOW (ref >60)
GFR, EST AFRICAN AMERICAN: 29 mL/min — AB (ref >60)
Glucose, Bld: 110 mg/dL — ABNORMAL HIGH (ref 65–99)
Potassium: 3.8 mmol/L (ref 3.5–5.1)
SODIUM: 143 mmol/L (ref 135–145)

## 2017-05-10 LAB — CBC
HEMATOCRIT: 27.8 % — AB (ref 36.0–46.0)
Hemoglobin: 8.3 g/dL — ABNORMAL LOW (ref 12.0–15.0)
MCH: 27.2 pg (ref 26.0–34.0)
MCHC: 29.9 g/dL — ABNORMAL LOW (ref 30.0–36.0)
MCV: 91.1 fL (ref 78.0–100.0)
Platelets: 159 10*3/uL (ref 150–400)
RBC: 3.05 MIL/uL — AB (ref 3.87–5.11)
RDW: 15.4 % (ref 11.5–15.5)
WBC: 6.6 10*3/uL (ref 4.0–10.5)

## 2017-05-10 LAB — RETICULOCYTES
RBC.: 3.05 MIL/uL — ABNORMAL LOW (ref 3.87–5.11)
Retic Count, Absolute: 58 10*3/uL (ref 19.0–186.0)
Retic Ct Pct: 1.9 % (ref 0.4–3.1)

## 2017-05-10 LAB — IRON AND TIBC
IRON: 20 ug/dL — AB (ref 28–170)
SATURATION RATIOS: 5 % — AB (ref 10.4–31.8)
TIBC: 374 ug/dL (ref 250–450)
UIBC: 354 ug/dL

## 2017-05-10 LAB — GLUCOSE, CAPILLARY
GLUCOSE-CAPILLARY: 210 mg/dL — AB (ref 65–99)
Glucose-Capillary: 117 mg/dL — ABNORMAL HIGH (ref 65–99)
Glucose-Capillary: 131 mg/dL — ABNORMAL HIGH (ref 65–99)
Glucose-Capillary: 258 mg/dL — ABNORMAL HIGH (ref 65–99)

## 2017-05-10 LAB — HEMOGLOBIN A1C
HEMOGLOBIN A1C: 6.3 % — AB (ref 4.8–5.6)
Mean Plasma Glucose: 134 mg/dL

## 2017-05-10 LAB — FERRITIN: FERRITIN: 25 ng/mL (ref 11–307)

## 2017-05-10 LAB — PROTIME-INR
INR: 1.47
PROTHROMBIN TIME: 17.9 s — AB (ref 11.4–15.2)

## 2017-05-10 LAB — VITAMIN B12: VITAMIN B 12: 1201 pg/mL — AB (ref 180–914)

## 2017-05-10 LAB — FOLATE: Folate: 33.5 ng/mL (ref >5.9)

## 2017-05-10 SURGERY — ECHOCARDIOGRAM, TRANSESOPHAGEAL
Anesthesia: Moderate Sedation

## 2017-05-10 MED ORDER — SODIUM CHLORIDE 0.9 % IV SOLN: INTRAVENOUS | Status: DC

## 2017-05-10 MED ORDER — SODIUM CHLORIDE 0.9 % IV SOLN
510.0000 mg | INTRAVENOUS | Status: DC
  Administered 2017-05-10: 510 mg via INTRAVENOUS
  Filled 2017-05-10 (×3): qty 17

## 2017-05-10 MED ORDER — FENTANYL CITRATE (PF) 100 MCG/2ML IJ SOLN
INTRAMUSCULAR | Status: DC | PRN
  Administered 2017-05-10: 25 ug via INTRAVENOUS

## 2017-05-10 MED ORDER — SODIUM CHLORIDE 0.9 % IV SOLN
INTRAVENOUS | Status: DC
  Administered 2017-05-10: 500 mL via INTRAVENOUS

## 2017-05-10 MED ORDER — FENTANYL CITRATE (PF) 100 MCG/2ML IJ SOLN
INTRAMUSCULAR | Status: AC
  Filled 2017-05-10: qty 2

## 2017-05-10 MED ORDER — POTASSIUM CHLORIDE CRYS ER 20 MEQ PO TBCR
40.0000 meq | EXTENDED_RELEASE_TABLET | Freq: Once | ORAL | Status: AC
  Administered 2017-05-10: 40 meq via ORAL
  Filled 2017-05-10: qty 2

## 2017-05-10 MED ORDER — METOLAZONE 5 MG PO TABS
2.5000 mg | ORAL_TABLET | Freq: Once | ORAL | Status: AC
  Administered 2017-05-10: 2.5 mg via ORAL
  Filled 2017-05-10: qty 1

## 2017-05-10 MED ORDER — MIDAZOLAM HCL 10 MG/2ML IJ SOLN
INTRAMUSCULAR | Status: DC | PRN
  Administered 2017-05-10: 2 mg via INTRAVENOUS
  Administered 2017-05-10: 1 mg via INTRAVENOUS

## 2017-05-10 MED ORDER — WARFARIN SODIUM 7.5 MG PO TABS
7.5000 mg | ORAL_TABLET | Freq: Once | ORAL | Status: AC
  Administered 2017-05-10: 7.5 mg via ORAL
  Filled 2017-05-10 (×2): qty 1

## 2017-05-10 MED ORDER — BUTAMBEN-TETRACAINE-BENZOCAINE 2-2-14 % EX AERO
INHALATION_SPRAY | CUTANEOUS | Status: DC | PRN
  Administered 2017-05-10: 2 via TOPICAL

## 2017-05-10 MED ORDER — MIDAZOLAM HCL 5 MG/ML IJ SOLN
INTRAMUSCULAR | Status: AC
  Filled 2017-05-10: qty 2

## 2017-05-10 NOTE — Progress Notes (Signed)
Advanced Heart Failure Rounding Note  PCP: Dr Neta Mends  Primary Cardiologist: Dr Aundra Dubin  Subjective:    Weight down 4 pounds overnight. Brisk diuresis with IV lasix and metolazone.   For TEE today. Feels well, denies chest pain, SOB and orthopnea. Says that she didn't get much sleep overnight.    Objective:   Weight Range: 164 lb 4.8 oz (74.5 kg) Body mass index is 32.09 kg/m.   Vital Signs:   Temp:  [97.6 F (36.4 C)-98.7 F (37.1 C)] 98.3 F (36.8 C) (08/03 0335) Pulse Rate:  [85-109] 109 (08/03 0335) Resp:  [17-19] 17 (08/03 0335) BP: (78-125)/(40-85) 125/85 (08/03 0335) SpO2:  [95 %-100 %] 99 % (08/03 0335) Weight:  [164 lb 4.8 oz (74.5 kg)] 164 lb 4.8 oz (74.5 kg) (08/03 0335)    Weight change: Filed Weights   05/08/17 1530 05/09/17 0500 05/10/17 0335  Weight: 168 lb 3.2 oz (76.3 kg) 166 lb 1.6 oz (75.3 kg) 164 lb 4.8 oz (74.5 kg)    Intake/Output:   Intake/Output Summary (Last 24 hours) at 05/10/17 0740 Last data filed at 05/10/17 0345  Gross per 24 hour  Intake              800 ml  Output             2650 ml  Net            -1850 ml      Physical Exam    General: Elderly female. No resp difficulty. HEENT: Normal Neck: Supple. JVP 14 cm . Carotids 2+ bilat; no bruits. No thyromegaly or nodule noted. Cor: PMI nondisplaced. Mildly tachy, RRR, No M/G/R noted Lungs: CTAB, normal effort. Abdomen: Soft, non-tender, non-distended, no HSM. No bruits or masses. +BS  Extremities: No cyanosis, clubbing, rash, 2+ pedal edema.  Neuro: Alert & orientedx3, cranial nerves grossly intact. moves all 4 extremities w/o difficulty. Affect pleasant    Telemetry   Afib 90's - personally reviewed.   EKG    Afib 96 bpm - personally reviewed.   Labs    CBC  Recent Labs  05/08/17 1247 05/10/17 0256  WBC 6.1 6.6  HGB 8.3* 8.3*  HCT 27.7* 27.8*  MCV 90.8 91.1  PLT 188 124   Basic Metabolic Panel  Recent Labs  05/09/17 0029 05/10/17 0256  NA  144 143  K 3.7 3.8  CL 102 100*  CO2 35* 33*  GLUCOSE 133* 110*  BUN 42* 39*  CREATININE 2.04* 1.80*  CALCIUM 8.9 9.0   Liver Function Tests  Recent Labs  05/08/17 1247  AST 22  ALT 11*  ALKPHOS 42  BILITOT 0.9  PROT 6.4*  ALBUMIN 3.4*    BNP: BNP (last 3 results)  Recent Labs  11/02/16 1228 05/08/17 1247 05/08/17 1612  BNP 324.3* 685.7* 863.4*       Imaging   Transthoracic Echocardiography 05/03/17   Study Conclusions  - Left ventricle: The cavity size was normal. There was moderate   concentric hypertrophy. Systolic function was normal. The   estimated ejection fraction was in the range of 55% to 60%. Wall   motion was normal; there were no regional wall motion   abnormalities. The study was not technically sufficient to allow   evaluation of LV diastolic dysfunction due to atrial   fibrillation. - Aortic valve: There is moderate to severe AS. The mean AV   gradient is 68mmHg but the calculated AVA is 0.73cm2. Visually   the valve  appears moderate to severely stenotic. Valve mobility   was restricted. There was moderate to severe stenosis. There was   moderate to severe regurgitation. Mean gradient (S): 20 mm Hg.   VTI ratio of LVOT to aortic valve: 0.29. Valve area (VTI): 0.73   cm^2. Valve area (Vmax): 0.9 cm^2. Valve area (Vmean): 0.81 cm^2. - Mitral valve: Calcified annulus. There was mild to moderate   regurgitation directed eccentrically and posteriorly. Valve area   by continuity equation (using LVOT flow): 1.38 cm^2. - Left atrium: The atrium was massively dilated. - Right ventricle: The cavity size was mildly dilated. Wall   thickness was normal. - Right atrium: The atrium was massively dilated. - Atrial septum: There was a small secundum atrial septal defect.   There was a left-to-right shunt through an atrial septal defect,   in the baseline state. - Tricuspid valve: There was moderate-severe regurgitation. - Pulmonary arteries: PA peak  pressure: 64 mm Hg (S). - Pericardium, extracardiac: A trivial, free-flowing pericardial   effusion was identified along the left ventricular free wall.  Impressions:  - Compared to prior echo, the aortic insufficiency has worsened, TR   has worsened and pulmonary HTN has increased. Moderate to severe   AS is present. The right ventricular systolic pressure was   increased consistent with moderate pulmonary hypertension.    Medications:     Scheduled Medications: . atorvastatin  20 mg Oral q1800  . calcium-vitamin D  1 tablet Oral Q breakfast  . carvedilol  6.25 mg Oral BID WC  . dorzolamide-timolol  1 drop Both Eyes BID  . ferrous sulfate  325 mg Oral Q breakfast  . furosemide  80 mg Intravenous Q8H  . glipiZIDE  10 mg Oral Q breakfast  . insulin aspart  0-15 Units Subcutaneous TID WC  . latanoprost  1 drop Both Eyes QHS  . magnesium oxide  400 mg Oral Daily  . multivitamin with minerals  1 tablet Oral Daily  . pantoprazole  40 mg Oral Daily  . tiotropium  18 mcg Inhalation Daily  . Warfarin - Pharmacist Dosing Inpatient   Does not apply q1800    Infusions: . sodium chloride      PRN Medications: sodium chloride, acetaminophen, ondansetron (ZOFRAN) IV    Patient Profile   Destiny [name] is an 82 year old with history of HTN, DMII, chronic a fib, NIMC. Severe MR with failed MV clip, porcelain aorta, copd, anemia, GI bleed 2018 with non bleeding gastric ulcer, AKI, pulmonary htn, and diastolic heart failure admitted with volume overload. Recent Echo with worsening AS.   Assessment/Plan   1. Acute on chronic diastolic CHF: Echo (3/71) with EF 30-35%, diffuse hypokinesis, improved to 45% on 3/17 TEE and then to 60-65% on 3/18 echo. EF remains normal at 55-60% on echo done in 7/18.  Nonischemic cardiomyopathy, no significant coronary disease on angiography. Acute decompensation may be related to her valvular disease with moderate to severe AS and moderate to severe AI.   She was admitted 8/1 with hypotension, AKI and volume overload.  She feels better today, some diuresis, creatinine lower.  SBP still 90s.  - Continue Lasix 80 mg BID and metolazone today.  - With low BP and normal EF, stopped Entresto and spironolactone.  - Continue Coreg 6.25 mg bid for rate control.  - Place ted hose.   2. Atrial fibrillation:  - Chronic, rate controlled.  - Continue warfarin - Continue Coreg.   3. Aortic stenosis/regurgitation: On 2/17 echo,  severe aortic stenosis by AVA, moderate by mean gradient.  By cath at that time, suspected moderate AS given relative ease of crossing valve.  TEE at that time showed low gradient probably moderate aortic stenosis.   Echo in 3/18 with moderate AS. However, most recent echo in 7/18 is suggestive of moderate-severe AS (mean gradient 20 but looks severe visually and AVA 0.7 cm^2) and moderate-severe AI.  Worsening valvular disease may have caused HF decompensation.   - She would be a TAVR candidate.  Plan TEE to more closely assess AoV and MV tomorrow if she remains stable.     - TEE today at 15:00   4. Pulmonary hypertension: Mild to moderate pulmonary hypertension on RHC but PVR not significantly elevated, suspect pulmonary venous hypertension.  - No change.   5. Mitral regurgitation: Suspected rupture of an anterior leaflet chord with very eccentric mitral regurgitation.  MR was severe on 3/17 TEE.   Was seen for surgery eval for MV repair and AoV replacement.  However, given porcelain aorta, she was deemed not a candidate for open heart surgery. She was sent for percutaneous MV clipping.  This was attempted by Dr Bridgette Habermann at [facility] but procedure failed due to the pattern of MV calcification.   - Echo in 7/18 with mild to moderate MR   6. COPD: Moderate to severe by PFTs. - Continue Spiriva   7. Anemia: Upper GI bleed in 3/18, possibly from gastric ulcer.  She is back on warfarin.  Recent colonoscopy with no bleeding source. Stool is still  dark, hemoglobin is stably low.  - Iron 20, will need IV iron.   8. Type II diabetes: - Continue SSI - Linagliptin stopped with CHF.   9. AKI: Creatinine up in setting of low BP and volume overload.   - Improved with diuresis.   Length of Stay: [place], NP  05/10/2017, 7:40 AM  Advanced Heart Failure Team Pager [phone] (M-F; 7a - 4p)  Please contact [facility] for night-coverage after hours (4p -7a ) and weekends on [email or web address]  Patient seen with NP, agree with the above note.    She is still volume overloaded, continue Lasix 80 mg IV bid with dose of metolazone again today. Creatinine is lower today.   BP remains soft, now off Entresto and spironolactone.   HR around 100 bpm, chronic afib. With soft BP and mildly elevated HR, would stop Coreg and use Toprol XL 25 mg daily instead.   Plan for TEE today to evaluate aortic valve.  Discussed risks/benefits with patient, she is willing to proceed.  If it looks like she needs TAVR, will need right and left heart cath prior (likely Monday if creatinine stable) and CTs.   Stable anemia with Fe deficiency.  Has had recent GI workup.  Will need IV Fe, give today.   Mobilize with PT/cardiac rehab.   Loralie Champagne 05/10/2017 12:10 PM

## 2017-05-10 NOTE — Plan of Care (Addendum)
Problem: Physical Regulation: Goal: Ability to maintain clinical measurements within normal limits will improve Outcome: Progressing Pt remains on 2L [place]. Denies any dyspnea or chest pain. VSS over night. Pt c/o aches and pains. Prn administered. Reports "burning" on tops of feet. Tolerating activity tp bedside commode. Still very weak and unsteady. Walker used. Pt slept up in chair, tolerated better than bed. Blood sugar 67 before bed. Snack administered.

## 2017-05-10 NOTE — Progress Notes (Signed)
ANTICOAGULATION CONSULT NOTE - Follow Up Consult  Pharmacy Consult for Coumadin Indication: atrial fibrillation  No Known Allergies  Patient Measurements: Height: 5' (152.4 cm) Weight: 164 lb 4.8 oz (74.5 kg) IBW/kg (Calculated) : 45.5  Vital Signs: Temp: 98 F (36.7 C) (08/03 1331) Temp Source: Oral (08/03 1331) BP: 107/69 (08/03 1500) Pulse Rate: 89 (08/03 1510)  Labs:  Recent Labs  05/08/17 1247 05/08/17 1612 05/09/17 0029 05/10/17 0256  HGB 8.3*  --   --  8.3*  HCT 27.7*  --   --  27.8*  PLT 188  --   --  159  LABPROT  --  16.2* 17.0* 17.9*  INR  --  1.29 1.37 1.47  CREATININE 2.17*  --  2.04* 1.80*    Estimated Creatinine Clearance: 22.5 mL/min (A) (by C-G formula based on SCr of 1.8 mg/dL (H)).  Assessment: 82yof on Coumadin for afib. INR 1.29 on admit. Patient tells me she had been off of her coumadin for a colonoscopy that was done last Wednesday and she just resumed taking it 7/31. Hgb low 8.3 but has hx anemia.  INR slow to rise after re-initiation of Coumadin 7/31.  PTA dose: 5mg  daily except 2.5mg  Mon/Fri  No overt bleeding or complications noted.  Goal of Therapy:  INR 2-3 Monitor platelets by anticoagulation protocol: Yes   Plan:  1) Repeat Coumadin 7.5 mg tonight 2) Daily INR  Uvaldo Rising, BCPS  Clinical Pharmacist Pager [phone]  05/10/2017 3:19 PM

## 2017-05-10 NOTE — H&P (View-Only) (Signed)
Advanced Heart Failure Rounding Note  PCP: Dr Neta Mends  Primary Cardiologist: Dr Aundra Dubin  Subjective:    Weight down 4 pounds overnight. Brisk diuresis with IV lasix and metolazone.   For TEE today. Feels well, denies chest pain, SOB and orthopnea. Says that she didn't get much sleep overnight.    Objective:   Weight Range: 164 lb 4.8 oz (74.5 kg) Body mass index is 32.09 kg/m.   Vital Signs:   Temp:  [97.6 F (36.4 C)-98.7 F (37.1 C)] 98.3 F (36.8 C) (08/03 0335) Pulse Rate:  [85-109] 109 (08/03 0335) Resp:  [17-19] 17 (08/03 0335) BP: (78-125)/(40-85) 125/85 (08/03 0335) SpO2:  [95 %-100 %] 99 % (08/03 0335) Weight:  [164 lb 4.8 oz (74.5 kg)] 164 lb 4.8 oz (74.5 kg) (08/03 0335)    Weight change: Filed Weights   05/08/17 1530 05/09/17 0500 05/10/17 0335  Weight: 168 lb 3.2 oz (76.3 kg) 166 lb 1.6 oz (75.3 kg) 164 lb 4.8 oz (74.5 kg)    Intake/Output:   Intake/Output Summary (Last 24 hours) at 05/10/17 0740 Last data filed at 05/10/17 0345  Gross per 24 hour  Intake              800 ml  Output             2650 ml  Net            -1850 ml      Physical Exam    General: Elderly female. No resp difficulty. HEENT: Normal Neck: Supple. JVP 14 cm . Carotids 2+ bilat; no bruits. No thyromegaly or nodule noted. Cor: PMI nondisplaced. Mildly tachy, RRR, No M/G/R noted Lungs: CTAB, normal effort. Abdomen: Soft, non-tender, non-distended, no HSM. No bruits or masses. +BS  Extremities: No cyanosis, clubbing, rash, 2+ pedal edema.  Neuro: Alert & orientedx3, cranial nerves grossly intact. moves all 4 extremities w/o difficulty. Affect pleasant    Telemetry   Afib 90's - personally reviewed.   EKG    Afib 96 bpm - personally reviewed.   Labs    CBC  Recent Labs  05/08/17 1247 05/10/17 0256  WBC 6.1 6.6  HGB 8.3* 8.3*  HCT 27.7* 27.8*  MCV 90.8 91.1  PLT 188 419   Basic Metabolic Panel  Recent Labs  05/09/17 0029 05/10/17 0256  NA  144 143  K 3.7 3.8  CL 102 100*  CO2 35* 33*  GLUCOSE 133* 110*  BUN 42* 39*  CREATININE 2.04* 1.80*  CALCIUM 8.9 9.0   Liver Function Tests  Recent Labs  05/08/17 1247  AST 22  ALT 11*  ALKPHOS 42  BILITOT 0.9  PROT 6.4*  ALBUMIN 3.4*    BNP: BNP (last 3 results)  Recent Labs  11/02/16 1228 05/08/17 1247 05/08/17 1612  BNP 324.3* 685.7* 863.4*       Imaging   Transthoracic Echocardiography 05/03/17   Study Conclusions  - Left ventricle: The cavity size was normal. There was moderate   concentric hypertrophy. Systolic function was normal. The   estimated ejection fraction was in the range of 55% to 60%. Wall   motion was normal; there were no regional wall motion   abnormalities. The study was not technically sufficient to allow   evaluation of LV diastolic dysfunction due to atrial   fibrillation. - Aortic valve: There is moderate to severe AS. The mean AV   gradient is 97mmHg but the calculated AVA is 0.73cm2. Visually   the valve  appears moderate to severely stenotic. Valve mobility   was restricted. There was moderate to severe stenosis. There was   moderate to severe regurgitation. Mean gradient (S): 20 mm Hg.   VTI ratio of LVOT to aortic valve: 0.29. Valve area (VTI): 0.73   cm^2. Valve area (Vmax): 0.9 cm^2. Valve area (Vmean): 0.81 cm^2. - Mitral valve: Calcified annulus. There was mild to moderate   regurgitation directed eccentrically and posteriorly. Valve area   by continuity equation (using LVOT flow): 1.38 cm^2. - Left atrium: The atrium was massively dilated. - Right ventricle: The cavity size was mildly dilated. Wall   thickness was normal. - Right atrium: The atrium was massively dilated. - Atrial septum: There was a small secundum atrial septal defect.   There was a left-to-right shunt through an atrial septal defect,   in the baseline state. - Tricuspid valve: There was moderate-severe regurgitation. - Pulmonary arteries: PA peak  pressure: 64 mm Hg (S). - Pericardium, extracardiac: A trivial, free-flowing pericardial   effusion was identified along the left ventricular free wall.  Impressions:  - Compared to prior echo, the aortic insufficiency has worsened, TR   has worsened and pulmonary HTN has increased. Moderate to severe   AS is present. The right ventricular systolic pressure was   increased consistent with moderate pulmonary hypertension.    Medications:     Scheduled Medications: . atorvastatin  20 mg Oral q1800  . calcium-vitamin D  1 tablet Oral Q breakfast  . carvedilol  6.25 mg Oral BID WC  . dorzolamide-timolol  1 drop Both Eyes BID  . ferrous sulfate  325 mg Oral Q breakfast  . furosemide  80 mg Intravenous Q8H  . glipiZIDE  10 mg Oral Q breakfast  . insulin aspart  0-15 Units Subcutaneous TID WC  . latanoprost  1 drop Both Eyes QHS  . magnesium oxide  400 mg Oral Daily  . multivitamin with minerals  1 tablet Oral Daily  . pantoprazole  40 mg Oral Daily  . tiotropium  18 mcg Inhalation Daily  . Warfarin - Pharmacist Dosing Inpatient   Does not apply q1800    Infusions: . sodium chloride      PRN Medications: sodium chloride, acetaminophen, ondansetron (ZOFRAN) IV    Patient Profile   Ms Losh is an 82 year old with history of HTN, DMII, chronic a fib, NIMC. Severe MR with failed MV clip, porcelain aorta, copd, anemia, GI bleed 2018 with non bleeding gastric ulcer, AKI, pulmonary htn, and diastolic heart failure admitted with volume overload. Recent Echo with worsening AS.   Assessment/Plan   1. Acute on chronic diastolic CHF: Echo (9/48) with EF 30-35%, diffuse hypokinesis, improved to 45% on 3/17 TEE and then to 60-65% on 3/18 echo. EF remains normal at 55-60% on echo done in 7/18.  Nonischemic cardiomyopathy, no significant coronary disease on angiography. Acute decompensation may be related to her valvular disease with moderate to severe AS and moderate to severe AI.   She was admitted 8/1 with hypotension, AKI and volume overload.  She feels better today, some diuresis, creatinine lower.  SBP still 90s.  - Continue Lasix 80 mg BID and metolazone today.  - With low BP and normal EF, stopped Entresto and spironolactone.  - Continue Coreg 6.25 mg bid for rate control.  - Place ted hose.   2. Atrial fibrillation:  - Chronic, rate controlled.  - Continue warfarin - Continue Coreg.   3. Aortic stenosis/regurgitation: On 2/17 echo,  severe aortic stenosis by AVA, moderate by mean gradient.  By cath at that time, suspected moderate AS given relative ease of crossing valve.  TEE at that time showed low gradient probably moderate aortic stenosis.   Echo in 3/18 with moderate AS. However, most recent echo in 7/18 is suggestive of moderate-severe AS (mean gradient 20 but looks severe visually and AVA 0.7 cm^2) and moderate-severe AI.  Worsening valvular disease may have caused HF decompensation.   - She would be a TAVR candidate.  Plan TEE to more closely assess AoV and MV tomorrow if she remains stable.     - TEE today at 15:00   4. Pulmonary hypertension: Mild to moderate pulmonary hypertension on RHC but PVR not significantly elevated, suspect pulmonary venous hypertension.  - No change.   5. Mitral regurgitation: Suspected rupture of an anterior leaflet chord with very eccentric mitral regurgitation.  MR was severe on 3/17 TEE.   Was seen for surgery eval for MV repair and AoV replacement.  However, given porcelain aorta, she was deemed not a candidate for open heart surgery. She was sent for percutaneous MV clipping.  This was attempted by Dr Bridgette Habermann at [facility] but procedure failed due to the pattern of MV calcification.   - Echo in 7/18 with mild to moderate MR   6. COPD: Moderate to severe by PFTs. - Continue Spiriva   7. Anemia: Upper GI bleed in 3/18, possibly from gastric ulcer.  She is back on warfarin.  Recent colonoscopy with no bleeding source. Stool is still  dark, hemoglobin is stably low.  - Iron 20, will need IV iron.   8. Type II diabetes: - Continue SSI - Linagliptin stopped with CHF.   9. AKI: Creatinine up in setting of low BP and volume overload.   - Improved with diuresis.   Length of Stay: [place], NP  05/10/2017, 7:40 AM  Advanced Heart Failure Team Pager [phone] (M-F; 7a - 4p)  Please contact [facility] for night-coverage after hours (4p -7a ) and weekends on [email or web address]  Patient seen with NP, agree with the above note.    She is still volume overloaded, continue Lasix 80 mg IV bid with dose of metolazone again today. Creatinine is lower today.   BP remains soft, now off Entresto and spironolactone.   HR around 100 bpm, chronic afib. With soft BP and mildly elevated HR, would stop Coreg and use Toprol XL 25 mg daily instead.   Plan for TEE today to evaluate aortic valve.  Discussed risks/benefits with patient, she is willing to proceed.  If it looks like she needs TAVR, will need right and left heart cath prior (likely Monday if creatinine stable) and CTs.   Stable anemia with Fe deficiency.  Has had recent GI workup.  Will need IV Fe, give today.   Mobilize with PT/cardiac rehab.   Loralie Champagne 05/10/2017 12:10 PM

## 2017-05-10 NOTE — CV Procedure (Signed)
Procedure: TEE  Indication: Aortic stenosis, mitral regurgitation.   Sedation: Versed 2 mg IV, Fentanyl 25 mcg IV   Findings: Please see echo section for full report.  Severely dilated right atrium.  Severely dilated left atrium with no LA appendage thrombus.  There was a small atrial septal defect, primarily left to right flow but with valsalva bubbles crossed on bubble study.  Suspect this is from prior septal puncture (with attempted mitral valve clipping).  The left ventricle was normal in size with mild LV hypertrophy.  EF 55-60%.  No regional wall motion abnormalities noted.  Mildly dilated RV with normal systolic function.  The aortic valve was severely calcified and trileaflet.  AVA about 0.4 cm^2 with mean gradient 31 mmHg.  Planimetered aortic valve area about 0.3 cm^2.  Suspect low gradient severe AS.  There was no significant aortic insufficiency.  The mitral valve was mildly calcified.  There appeared to be a small flail segment of the anterior mitral leaflet.  There was very eccentric posteriorly-directed mitral regurgitation curling around to the back of the left atrium. I suspect that mitral regurgitation is still severe.  There was systolic flow reversal in the pulmonary doppler pattern on 1 out of 2 pulmonary veins sampled.  Normal caliber thoracic aorta.  Moderate to severe tricuspid regurgitation with peak RV-RA gradient 52 mmHg.    Impression:  1. Low gradient severe AS.  2. Severe, eccentric MR (similar to prior TEE), likely from flail segment of anterior leaflet.  3. Moderate to severe TR>  4. Normal LV systolic function.   Destiny [name] 05/10/2017 3:15 PM

## 2017-05-10 NOTE — Interval H&P Note (Signed)
History and Physical Interval Note:  05/10/2017 2:28 PM  Destiny [name]  has presented today for surgery, with the diagnosis of STENOSIS  The various methods of treatment have been discussed with the patient and family. After consideration of risks, benefits and other options for treatment, the patient has consented to  Procedure(s): TRANSESOPHAGEAL ECHOCARDIOGRAM (TEE) (N/A) as a surgical intervention .  The patient's history has been reviewed, patient examined, no change in status, stable for surgery.  I have reviewed the patient's chart and labs.  Questions were answered to the patient's satisfaction.     Teejay Meader [employer]

## 2017-05-11 ENCOUNTER — Encounter (HOSPITAL_COMMUNITY): Payer: Self-pay | Admitting: Cardiology

## 2017-05-11 LAB — CBC
HCT: 28.6 % — ABNORMAL LOW (ref 36.0–46.0)
Hemoglobin: 8.5 g/dL — ABNORMAL LOW (ref 12.0–15.0)
MCH: 27.4 pg (ref 26.0–34.0)
MCHC: 29.7 g/dL — AB (ref 30.0–36.0)
MCV: 92.3 fL (ref 78.0–100.0)
PLATELETS: 168 10*3/uL (ref 150–400)
RBC: 3.1 MIL/uL — ABNORMAL LOW (ref 3.87–5.11)
RDW: 15.5 % (ref 11.5–15.5)
WBC: 6.3 10*3/uL (ref 4.0–10.5)

## 2017-05-11 LAB — GLUCOSE, CAPILLARY
GLUCOSE-CAPILLARY: 169 mg/dL — AB (ref 65–99)
GLUCOSE-CAPILLARY: 121 mg/dL — AB (ref 65–99)
GLUCOSE-CAPILLARY: 152 mg/dL — AB (ref 65–99)
Glucose-Capillary: 190 mg/dL — ABNORMAL HIGH (ref 65–99)

## 2017-05-11 LAB — PROTIME-INR
INR: 1.73
PROTHROMBIN TIME: 20.5 s — AB (ref 11.4–15.2)

## 2017-05-11 LAB — BASIC METABOLIC PANEL
Anion gap: 8 (ref 5–15)
BUN: 28 mg/dL — ABNORMAL HIGH (ref 6–20)
CALCIUM: 9.4 mg/dL (ref 8.9–10.3)
CHLORIDE: 99 mmol/L — AB (ref 101–111)
CO2: 34 mmol/L — ABNORMAL HIGH (ref 22–32)
CREATININE: 1.26 mg/dL — AB (ref 0.44–1.00)
GFR, EST AFRICAN AMERICAN: 45 mL/min — AB (ref >60)
GFR, EST NON AFRICAN AMERICAN: 39 mL/min — AB (ref >60)
Glucose, Bld: 234 mg/dL — ABNORMAL HIGH (ref 65–99)
Potassium: 3.7 mmol/L (ref 3.5–5.1)
SODIUM: 141 mmol/L (ref 135–145)

## 2017-05-11 MED ORDER — METOPROLOL SUCCINATE ER 25 MG PO TB24
25.0000 mg | ORAL_TABLET | Freq: Two times a day (BID) | ORAL | Status: DC
  Administered 2017-05-11 – 2017-05-12 (×2): 25 mg via ORAL
  Filled 2017-05-11 (×2): qty 1

## 2017-05-11 MED ORDER — HYDROCODONE-ACETAMINOPHEN 7.5-325 MG PO TABS
1.0000 | ORAL_TABLET | Freq: Four times a day (QID) | ORAL | Status: DC | PRN
Start: 2017-05-11 — End: 2017-05-17
  Administered 2017-05-11 – 2017-05-17 (×10): 1 via ORAL
  Filled 2017-05-11 (×12): qty 1

## 2017-05-11 MED ORDER — WARFARIN SODIUM 7.5 MG PO TABS: 7.5000 mg | ORAL_TABLET | Freq: Once | ORAL | Status: DC

## 2017-05-11 MED ORDER — METOLAZONE 5 MG PO TABS
2.5000 mg | ORAL_TABLET | Freq: Once | ORAL | Status: AC
  Administered 2017-05-11: 2.5 mg via ORAL
  Filled 2017-05-11: qty 1

## 2017-05-11 NOTE — Progress Notes (Signed)
Pt still c/o intermittent "burning" on tops of feet. Despite non-pharm interventions. Per pt it is "hard to sleep" Notified MD on call.

## 2017-05-11 NOTE — Progress Notes (Signed)
ANTICOAGULATION CONSULT NOTE - Follow Up Consult  Pharmacy Consult for Coumadin Indication: atrial fibrillation  No Known Allergies  Patient Measurements: Height: 5' (152.4 cm) Weight: 161 lb 9.6 oz (73.3 kg) IBW/kg (Calculated) : 45.5  Vital Signs: Temp: 98 F (36.7 C) (08/04 0500) Temp Source: Oral (08/04 0500) BP: 110/66 (08/04 0500) Pulse Rate: 64 (08/04 0500)  Labs:  Recent Labs  05/08/17 1247  05/09/17 0029 05/10/17 0256 05/11/17 0408  HGB 8.3*  --   --  8.3* 8.5*  HCT 27.7*  --   --  27.8* 28.6*  PLT 188  --   --  159 168  LABPROT  --   < > 17.0* 17.9* 20.5*  INR  --   < > 1.37 1.47 1.73  CREATININE 2.17*  --  2.04* 1.80* 1.26*  < > = values in this interval not displayed.  Estimated Creatinine Clearance: 31.8 mL/min (A) (by C-G formula based on SCr of 1.26 mg/dL (H)).  Assessment: 82yof on Coumadin PTA for afib. INR 1.29 on admit. Per patient, she has been off of her coumadin for a colonoscopy that was done last Wednesday 7/25 and she resumed taking it 7/31. INR slow to rise after re-initiation of Coumadin 7/31, remains subtherapeutic at 1.73. Hgb low 8.5 but has hx anemia. Pltc stable WNL. No bleeding noted.  PTA dose: 5mg  daily except 2.5mg  Mon/Fri  Goal of Therapy:  INR 2-3 Monitor platelets by anticoagulation protocol: Yes   Plan:  1) Repeat Coumadin 7.5 mg tonight 2) Daily INR   Erin N. Gerarda Fraction, PharmD PGY1 Pharmacy Resident Pager: [phone]   05/11/2017 8:02 AM

## 2017-05-11 NOTE — Evaluation (Signed)
Physical Therapy Evaluation Patient Details Name: Destiny [name] MRN: [id number] DOB: [birth year] Today's Date: 05/11/2017   History of Present Illness  Destiny [name] is an 82 year old with history of HTN, DMII, chronic a fib, NIMC. Severe MR with failed MV clip, porcelain aorta, copd, anemia, GI bleed 2018 with non bleeding gastric ulcer, AKI, pulmonary htn, and diastolic heart failure admitted with volume overload. Recent Echo with worsening AS. TEE performed, plan for [facility] on 05/13/17 and possible TAVR candidate.  Clinical Impression  Pt admitted with above diagnosis. Pt currently with functional limitations due to the deficits listed below (see PT Problem List). Pt ambulated 20' on RA with O2 sats down to 85%, O2 applied and sats back up to 95%. Pt could not tolerate more than 60' ambulation and requires min A for sit to stand most of the time. Would benefit from post acute rehab but adamantly refuses.  Pt will benefit from skilled PT to increase their independence and safety with mobility to allow discharge to the venue listed below.       Follow Up Recommendations Home health PT    Equipment Recommendations  None recommended by PT    Recommendations for Other Services       Precautions / Restrictions Precautions Precautions: Fall Precaution Comments: watch O2 sats Restrictions Weight Bearing Restrictions: No      Mobility  Bed Mobility               General bed mobility comments: pt received in chair  Transfers Overall transfer level: Needs assistance Equipment used: Rolling walker (2 wheeled) Transfers: Sit to/from [facility] Sit to Stand: Min assist Stand pivot transfers: Min guard       General transfer comment: pt has trouble getting up from chair. First time needed min A, second time just light min. Once she is up, min-guard to pivot to [facility]  Ambulation/Gait Ambulation/Gait assistance: Min guard Ambulation Distance (Feet): 60  Feet Assistive device: Rolling walker (2 wheeled) Gait Pattern/deviations: Trunk flexed;Step-through pattern;Shuffle Gait velocity: decreased Gait velocity interpretation: <1.8 ft/sec, indicative of risk for recurrent falls General Gait Details: 1st 46' on RA and pt very fatigued with O2 sats down to 85%. 2L O2 applied and pt ambulated last 51' with O2 sats 95% at end. Pt subjectively did not feel much different per her report. Slow gait with flexed posture  Stairs            Wheelchair Mobility    Modified Rankin (Stroke Patients Only)       Balance Overall balance assessment: Needs assistance Sitting-balance support: No upper extremity supported Sitting balance-Leahy Scale: Good     Standing balance support: No upper extremity supported Standing balance-Leahy Scale: Poor Standing balance comment: needs support to maintain standing                             Pertinent Vitals/Pain Pain Assessment: No/denies pain    Home Living Family/patient expects to be discharged to:: Private residence Living Arrangements: Alone Available Help at Discharge: Friend(s);Available PRN/intermittently (minimally) Type of Home: Mobile home Home Access: Stairs to enter;Ramped entrance Entrance Stairs-Rails: Right;Left;Can reach both Entrance Stairs-Number of Steps: 3 Home Layout: One level Home Equipment: Walker - 2 wheels Additional Comments: pt lives with her dog. She says she has a good friend that helps her if needed but she doesn't generally need help    Prior Function Level of Independence: Independent with assistive  device(s)         Comments: RW     Hand Dominance        Extremity/Trunk Assessment   Upper Extremity Assessment Upper Extremity Assessment: Generalized weakness    Lower Extremity Assessment Lower Extremity Assessment: Generalized weakness    Cervical / Trunk Assessment Cervical / Trunk Assessment: Kyphotic  Communication    Communication: No difficulties  Cognition Arousal/Alertness: Awake/alert Behavior During Therapy: Agitated Overall Cognitive Status: Within Functional Limits for tasks assessed                                 General Comments: agitated because she has bills to be paid at home and her friend is trying to find everything to bring to her but is having trouble. Really wants to go home and take care of things and come back. Quite fixated on this throughout eval      General Comments      Exercises     Assessment/Plan    PT Assessment Patient needs continued PT services  PT Problem List Decreased strength;Decreased range of motion;Decreased activity tolerance;Decreased balance;Decreased mobility;Cardiopulmonary status limiting activity       PT Treatment Interventions DME instruction;Gait training;Stair training;Functional mobility training;Therapeutic activities;Therapeutic exercise;Balance training;Patient/family education    PT Goals (Current goals can be found in the Care Plan section)  Acute Rehab PT Goals Patient Stated Goal: return home PT Goal Formulation: With patient Time For Goal Achievement: 05/25/17 Potential to Achieve Goals: Fair    Frequency Min 3X/week   Barriers to discharge Decreased caregiver support      Co-evaluation               AM-PAC PT "6 Clicks" Daily Activity  Outcome Measure Difficulty turning over in bed (including adjusting bedclothes, sheets and blankets)?: A Little Difficulty moving from lying on back to sitting on the side of the bed? : A Little Difficulty sitting down on and standing up from a chair with arms (e.g., wheelchair, bedside commode, etc,.)?: Total Help needed moving to and from a bed to chair (including a wheelchair)?: A Little Help needed walking in hospital room?: A Little Help needed climbing 3-5 steps with a railing? : A Lot 6 Click Score: 15    End of Session Equipment Utilized During Treatment: Gait  belt;Oxygen Activity Tolerance: Patient limited by fatigue Patient left: in chair;with call bell/phone within reach Nurse Communication: Mobility status PT Visit Diagnosis: Unsteadiness on feet (R26.81);Muscle weakness (generalized) (M62.81);Difficulty in walking, not elsewhere classified (R26.2)    Time: 7588-3254 PT Time Calculation (min) (ACUTE ONLY): 27 min   Charges:   PT Evaluation $PT Eval Moderate Complexity: 1 Mod PT Treatments $Gait Training: 8-22 mins   PT G Codes:        Leighton Roach, PT  Acute Rehab Services  [place] 05/11/2017, 2:03 PM

## 2017-05-11 NOTE — Progress Notes (Signed)
Oxygen Requirements   SATURATION QUALIFICATIONS: (This note is used to comply with regulatory documentation for home oxygen)  Patient Saturations on Room Air at Rest = 92%  Patient Saturations on Room Air while Ambulating = 85%  Patient Saturations on 2 Liters of oxygen while Ambulating = 95%  Please briefly explain why patient needs home oxygen: Pt desats with activity on RA.   Leighton Roach, [place]  [phone]

## 2017-05-11 NOTE — Progress Notes (Signed)
Patient ID: Destiny [name], female   DOB: 12-Sep-1935, 82 y.o.   MRN: [id number]     Advanced Heart Failure Rounding Note  PCP: Dr Neta Mends  Primary Cardiologist: Dr Aundra Dubin  Subjective:    Weight down 3 pounds overnight. Brisk diuresis with IV lasix and metolazone.  Breathing getting better.  HR remains 100s atrial fibrillation.   TEE: EF 55-60%, mildly dilated RV with normal systolic function, low gradient severe AS no AI, suspect severe eccentric MR from suspected small flail segment on anterior leaflet. Small ASD.    Objective:   Weight Range: 161 lb 9.6 oz (73.3 kg) Body mass index is 31.56 kg/m.   Vital Signs:   Temp:  [97.4 F (36.3 C)-98.2 F (36.8 C)] 98 F (36.7 C) (08/04 0500) Pulse Rate:  [64-112] 107 (08/04 1140) Resp:  [10-23] 16 (08/04 0500) BP: (92-137)/(44-83) 112/66 (08/04 1140) SpO2:  [92 %-100 %] 98 % (08/04 1140) Weight:  [161 lb 9.6 oz (73.3 kg)] 161 lb 9.6 oz (73.3 kg) (08/04 0500) Last BM Date: 05/09/17  Weight change: Filed Weights   05/09/17 0500 05/10/17 0335 05/11/17 0500  Weight: 166 lb 1.6 oz (75.3 kg) 164 lb 4.8 oz (74.5 kg) 161 lb 9.6 oz (73.3 kg)    Intake/Output:   Intake/Output Summary (Last 24 hours) at 05/11/17 1227 Last data filed at 05/11/17 1143  Gross per 24 hour  Intake              360 ml  Output             3950 ml  Net            -3590 ml      Physical Exam    General: NAD Neck: JVP 14+cm, no thyromegaly or thyroid nodule.  Lungs: Crackles at bases. CV: Nondisplaced PMI.  Heart regular S1/S2, no S3/S4, 3/6 SEM RUSB with obscuring of S2.  2+ edema to knees.   Abdomen: Soft, nontender, no hepatosplenomegaly, no distention.  Skin: Intact without lesions or rashes.  Neurologic: Alert and oriented x 3.  Psych: Normal affect. Extremities: No clubbing or cyanosis.  HEENT: Normal.    Telemetry   Afib 100's - personally reviewed.   Labs    CBC  Recent Labs  05/10/17 0256 05/11/17 0408  WBC 6.6 6.3  HGB  8.3* 8.5*  HCT 27.8* 28.6*  MCV 91.1 92.3  PLT 159 737   Basic Metabolic Panel  Recent Labs  05/10/17 0256 05/11/17 0408  NA 143 141  K 3.8 3.7  CL 100* 99*  CO2 33* 34*  GLUCOSE 110* 234*  BUN 39* 28*  CREATININE 1.80* 1.26*  CALCIUM 9.0 9.4   Liver Function Tests  Recent Labs  05/08/17 1247  AST 22  ALT 11*  ALKPHOS 42  BILITOT 0.9  PROT 6.4*  ALBUMIN 3.4*    BNP: BNP (last 3 results)  Recent Labs  11/02/16 1228 05/08/17 1247 05/08/17 1612  BNP 324.3* 685.7* 863.4*       Imaging   Transthoracic Echocardiography 05/03/17   Study Conclusions  - Left ventricle: The cavity size was normal. There was moderate   concentric hypertrophy. Systolic function was normal. The   estimated ejection fraction was in the range of 55% to 60%. Wall   motion was normal; there were no regional wall motion   abnormalities. The study was not technically sufficient to allow   evaluation of LV diastolic dysfunction due to atrial   fibrillation. - Aortic  valve: There is moderate to severe AS. The mean AV   gradient is 58mmHg but the calculated AVA is 0.73cm2. Visually   the valve appears moderate to severely stenotic. Valve mobility   was restricted. There was moderate to severe stenosis. There was   moderate to severe regurgitation. Mean gradient (S): 20 mm Hg.   VTI ratio of LVOT to aortic valve: 0.29. Valve area (VTI): 0.73   cm^2. Valve area (Vmax): 0.9 cm^2. Valve area (Vmean): 0.81 cm^2. - Mitral valve: Calcified annulus. There was mild to moderate   regurgitation directed eccentrically and posteriorly. Valve area   by continuity equation (using LVOT flow): 1.38 cm^2. - Left atrium: The atrium was massively dilated. - Right ventricle: The cavity size was mildly dilated. Wall   thickness was normal. - Right atrium: The atrium was massively dilated. - Atrial septum: There was a small secundum atrial septal defect.   There was a left-to-right shunt through an  atrial septal defect,   in the baseline state. - Tricuspid valve: There was moderate-severe regurgitation. - Pulmonary arteries: PA peak pressure: 64 mm Hg (S). - Pericardium, extracardiac: A trivial, free-flowing pericardial   effusion was identified along the left ventricular free wall.  Impressions:  - Compared to prior echo, the aortic insufficiency has worsened, TR   has worsened and pulmonary HTN has increased. Moderate to severe   AS is present. The right ventricular systolic pressure was   increased consistent with moderate pulmonary hypertension.    Medications:     Scheduled Medications: . atorvastatin  20 mg Oral q1800  . calcium-vitamin D  1 tablet Oral Q breakfast  . dorzolamide-timolol  1 drop Both Eyes BID  . ferrous sulfate  325 mg Oral Q breakfast  . furosemide  80 mg Intravenous Q8H  . glipiZIDE  10 mg Oral Q breakfast  . insulin aspart  0-15 Units Subcutaneous TID WC  . latanoprost  1 drop Both Eyes QHS  . magnesium oxide  400 mg Oral Daily  . metoprolol succinate  25 mg Oral BID  . multivitamin with minerals  1 tablet Oral Daily  . pantoprazole  40 mg Oral Daily  . tiotropium  18 mcg Inhalation Daily  . warfarin  7.5 mg Oral ONCE-1800  . Warfarin - Pharmacist Dosing Inpatient   Does not apply q1800    Infusions: . sodium chloride    . ferumoxytol Stopped (05/10/17 1119)    PRN Medications: sodium chloride, acetaminophen, ondansetron (ZOFRAN) IV    Patient Profile   Destiny [name] is an 82 year old with history of HTN, DMII, chronic a fib, NIMC. Severe MR with failed MV clip, porcelain aorta, copd, anemia, GI bleed 2018 with non bleeding gastric ulcer, AKI, pulmonary htn, and diastolic heart failure admitted with volume overload. Recent Echo with worsening AS.   Assessment/Plan   1. Acute on chronic diastolic CHF: Echo (9/21) with EF 30-35%, diffuse hypokinesis, improved to 45% on 3/17 TEE and then to 60-65% on 3/18 echo. EF remains normal at  55-60% on echo done in 7/18.  Nonischemic cardiomyopathy, no significant coronary disease on angiography. Acute decompensation may be related to her valvular disease with worsening AS and ongoing MR.  She was admitted 8/1 with hypotension, AKI and volume overload.  She feels better today with diuresis, creatinine lower.  SBP 100s.  She is still volume overloaded by exam.   - Continue Lasix 80 mg q8 hrs and metolazone 2.5 again today.  - With low BP  and normal EF, stopped Entresto and spironolactone.  - Will use Toprol XL for rate control to see if more effective.   - Place ted hose.  2. Atrial fibrillation: Chronic, HR mildly elevated.  - Hold warfarin for cath on Monday. INR 1.7.  With anemia and ?GI bleeding will not bridge with heparin.  3. Aortic stenosis: Severe AS on TEE 8/3, no AI.  Suspect this has worsened.  I think valvular heart disease has triggered her decompensation.  - I think she will be a TAVR candidate.  Plan L/RHC on Monday and will consult valve team.   We discussed risks/benefits of cath and she agrees to proceed.  Creatinine has come back down.  4. Pulmonary hypertension: Mild to moderate pulmonary hypertension on RHC but PVR not significantly elevated, suspect pulmonary venous hypertension.  - No change.  5. Mitral regurgitation: Suspected rupture of an anterior leaflet chord with very eccentric mitral regurgitation.  MR was severe on 3/17 TEE.   Was seen for surgery eval for MV repair and AoV replacement.  However, given porcelain aorta, she was deemed not a candidate for open heart surgery. She was sent for percutaneous MV clipping.  This was attempted by Dr Bridgette Habermann at [facility] but procedure failed due to the pattern of MV calcification.  Subsequent TTEs were read as mild to moderate MR, but suspect they just did not visualize the MV effectively. TEE yesterday suggested ongoing severe MR with small flail segment of the anterior leaflet.  Unfortunately, there is now way to repair this.   May improve with correction of severe AS.  6. COPD: Moderate to severe by PFTs. - Continue Spiriva  7. Anemia: Upper GI bleed in 3/18, possibly from gastric ulcer.  She is back on warfarin.  Recent colonoscopy with no bleeding source. Stool is still dark, hemoglobin is stably low.  - Got IV Fe.  8. Type II diabetes: - Continue SSI - Linagliptin stopped with CHF.  9. AKI: Creatinine up in setting of low BP and volume overload.  Suspect this was cardiorenal, now cr back down.   Mobilize out of bed.   Length of Stay: 3  Loralie Champagne, MD  05/11/2017, 12:27 PM  Advanced Heart Failure Team Pager [phone] (M-F; 7a - 4p)  Please contact [facility] for night-coverage after hours (4p -7a ) and weekends on [email or web address]

## 2017-05-12 LAB — GLUCOSE, CAPILLARY
GLUCOSE-CAPILLARY: 149 mg/dL — AB (ref 65–99)
GLUCOSE-CAPILLARY: 219 mg/dL — AB (ref 65–99)
Glucose-Capillary: 218 mg/dL — ABNORMAL HIGH (ref 65–99)
Glucose-Capillary: 178 mg/dL — ABNORMAL HIGH (ref 65–99)

## 2017-05-12 LAB — BASIC METABOLIC PANEL
ANION GAP: 9 (ref 5–15)
BUN: 28 mg/dL — AB (ref 6–20)
CALCIUM: 9.6 mg/dL (ref 8.9–10.3)
CO2: 37 mmol/L — ABNORMAL HIGH (ref 22–32)
Chloride: 95 mmol/L — ABNORMAL LOW (ref 101–111)
Creatinine, Ser: 1.25 mg/dL — ABNORMAL HIGH (ref 0.44–1.00)
GFR calc Af Amer: 46 mL/min — ABNORMAL LOW (ref >60)
GFR, EST NON AFRICAN AMERICAN: 40 mL/min — AB (ref >60)
Glucose, Bld: 130 mg/dL — ABNORMAL HIGH (ref 65–99)
POTASSIUM: 3.4 mmol/L — AB (ref 3.5–5.1)
SODIUM: 141 mmol/L (ref 135–145)

## 2017-05-12 LAB — CBC
HCT: 28.6 % — ABNORMAL LOW (ref 36.0–46.0)
Hemoglobin: 8.4 g/dL — ABNORMAL LOW (ref 12.0–15.0)
MCH: 26.5 pg (ref 26.0–34.0)
MCHC: 29.4 g/dL — ABNORMAL LOW (ref 30.0–36.0)
MCV: 90.2 fL (ref 78.0–100.0)
PLATELETS: 187 10*3/uL (ref 150–400)
RBC: 3.17 MIL/uL — AB (ref 3.87–5.11)
RDW: 15.3 % (ref 11.5–15.5)
WBC: 7.3 10*3/uL (ref 4.0–10.5)

## 2017-05-12 LAB — PROTIME-INR
INR: 2.09
Prothrombin Time: 23.8 seconds — ABNORMAL HIGH (ref 11.4–15.2)

## 2017-05-12 MED ORDER — POTASSIUM CHLORIDE CRYS ER 20 MEQ PO TBCR
40.0000 meq | EXTENDED_RELEASE_TABLET | Freq: Two times a day (BID) | ORAL | Status: DC
  Administered 2017-05-12 – 2017-05-13 (×2): 40 meq via ORAL
  Filled 2017-05-12 (×2): qty 2

## 2017-05-12 MED ORDER — SODIUM CHLORIDE 0.9% FLUSH: 3.0000 mL | INTRAVENOUS | Status: DC | PRN

## 2017-05-12 MED ORDER — SODIUM CHLORIDE 0.9% FLUSH
3.0000 mL | Freq: Two times a day (BID) | INTRAVENOUS | Status: DC
  Administered 2017-05-12 – 2017-05-13 (×2): 3 mL via INTRAVENOUS

## 2017-05-12 MED ORDER — POTASSIUM CHLORIDE CRYS ER 20 MEQ PO TBCR
40.0000 meq | EXTENDED_RELEASE_TABLET | Freq: Once | ORAL | Status: AC
  Administered 2017-05-12: 40 meq via ORAL
  Filled 2017-05-12: qty 2

## 2017-05-12 MED ORDER — METOPROLOL SUCCINATE ER 25 MG PO TB24
25.0000 mg | ORAL_TABLET | Freq: Once | ORAL | Status: AC
  Administered 2017-05-12: 25 mg via ORAL
  Filled 2017-05-12: qty 1

## 2017-05-12 MED ORDER — METOLAZONE 5 MG PO TABS
2.5000 mg | ORAL_TABLET | Freq: Once | ORAL | Status: AC
  Administered 2017-05-12: 2.5 mg via ORAL
  Filled 2017-05-12: qty 1

## 2017-05-12 MED ORDER — METOPROLOL SUCCINATE ER 50 MG PO TB24
50.0000 mg | ORAL_TABLET | Freq: Two times a day (BID) | ORAL | Status: DC
  Administered 2017-05-12 – 2017-05-13 (×2): 50 mg via ORAL
  Filled 2017-05-12 (×2): qty 1

## 2017-05-12 MED ORDER — TRAZODONE HCL 50 MG PO TABS
50.0000 mg | ORAL_TABLET | Freq: Every evening | ORAL | Status: DC | PRN
  Administered 2017-05-14: 50 mg via ORAL
  Filled 2017-05-12: qty 1

## 2017-05-12 MED ORDER — SODIUM CHLORIDE 0.9 % IV SOLN
INTRAVENOUS | Status: DC
  Administered 2017-05-13: 10 mL/h via INTRAVENOUS

## 2017-05-12 MED ORDER — SODIUM CHLORIDE 0.9 % IV SOLN: 250.0000 mL | INTRAVENOUS | Status: DC | PRN

## 2017-05-12 NOTE — Progress Notes (Signed)
Patient ID: CARREEN MILIUS, female   DOB: [birth year], 82 y.o.   MRN: [id number]     Advanced Heart Failure Rounding Note  PCP: Dr Neta Mends  Primary Cardiologist: Dr Aundra Dubin  Subjective:    She diuresed well yesterday but slept poorly and "feels bad" today.  Denies dyspnea at rest.  Worked with PT. Creatinine stable.   TEE: EF 55-60%, mildly dilated RV with normal systolic function, low gradient severe AS no AI, suspect severe eccentric MR from suspected small flail segment on anterior leaflet. Small ASD.    Objective:   Weight Range: 161 lb 9.6 oz (73.3 kg) Body mass index is 31.56 kg/m.   Vital Signs:   Temp:  [98.1 F (36.7 C)-99.5 F (37.5 C)] 98.1 F (36.7 C) (08/05 0306) Pulse Rate:  [97-109] 109 (08/05 0306) Resp:  [15-17] 17 (08/05 0306) BP: (109-115)/(58-71) 114/71 (08/05 0306) SpO2:  [92 %-98 %] 92 % (08/05 0306) Last BM Date: 05/10/17  Weight change: Filed Weights   05/09/17 0500 05/10/17 0335 05/11/17 0500  Weight: 166 lb 1.6 oz (75.3 kg) 164 lb 4.8 oz (74.5 kg) 161 lb 9.6 oz (73.3 kg)    Intake/Output:   Intake/Output Summary (Last 24 hours) at 05/12/17 1105 Last data filed at 05/12/17 1100  Gross per 24 hour  Intake              120 ml  Output             2450 ml  Net            -2330 ml      Physical Exam    General: NAD Neck: JVP 14 cm, no thyromegaly or thyroid nodule.  Lungs: Crackles at bases. CV: Nondisplaced PMI.  Heart regular S1/S2, no S3/S4, 3/6 SEM RUSB with obscuring of S2.  1+ edema to knees.   Abdomen: Soft, nontender, no hepatosplenomegaly, no distention.  Skin: Intact without lesions or rashes.  Neurologic: Alert and oriented x 3.  Psych: Normal affect. Extremities: No clubbing or cyanosis.  HEENT: Normal.   Telemetry   Afib 100s-110s, personally reviewed.   Labs    CBC  Recent Labs  05/11/17 0408 05/12/17 0246  WBC 6.3 7.3  HGB 8.5* 8.4*  HCT 28.6* 28.6*  MCV 92.3 90.2  PLT 168 944   Basic Metabolic  Panel  Recent Labs  05/11/17 0408 05/12/17 0246  NA 141 141  K 3.7 3.4*  CL 99* 95*  CO2 34* 37*  GLUCOSE 234* 130*  BUN 28* 28*  CREATININE 1.26* 1.25*  CALCIUM 9.4 9.6   Liver Function Tests No results for input(s): AST, ALT, ALKPHOS, BILITOT, PROT, ALBUMIN in the last 72 hours.  BNP: BNP (last 3 results)  Recent Labs  11/02/16 1228 05/08/17 1247 05/08/17 1612  BNP 324.3* 685.7* 863.4*       Imaging   Transthoracic Echocardiography 05/03/17   Study Conclusions  - Left ventricle: The cavity size was normal. There was moderate   concentric hypertrophy. Systolic function was normal. The   estimated ejection fraction was in the range of 55% to 60%. Wall   motion was normal; there were no regional wall motion   abnormalities. The study was not technically sufficient to allow   evaluation of LV diastolic dysfunction due to atrial   fibrillation. - Aortic valve: There is moderate to severe AS. The mean AV   gradient is 6mmHg but the calculated AVA is 0.73cm2. Visually   the valve appears moderate  to severely stenotic. Valve mobility   was restricted. There was moderate to severe stenosis. There was   moderate to severe regurgitation. Mean gradient (S): 20 mm Hg.   VTI ratio of LVOT to aortic valve: 0.29. Valve area (VTI): 0.73   cm^2. Valve area (Vmax): 0.9 cm^2. Valve area (Vmean): 0.81 cm^2. - Mitral valve: Calcified annulus. There was mild to moderate   regurgitation directed eccentrically and posteriorly. Valve area   by continuity equation (using LVOT flow): 1.38 cm^2. - Left atrium: The atrium was massively dilated. - Right ventricle: The cavity size was mildly dilated. Wall   thickness was normal. - Right atrium: The atrium was massively dilated. - Atrial septum: There was a small secundum atrial septal defect.   There was a left-to-right shunt through an atrial septal defect,   in the baseline state. - Tricuspid valve: There was moderate-severe  regurgitation. - Pulmonary arteries: PA peak pressure: 64 mm Hg (S). - Pericardium, extracardiac: A trivial, free-flowing pericardial   effusion was identified along the left ventricular free wall.  Impressions:  - Compared to prior echo, the aortic insufficiency has worsened, TR   has worsened and pulmonary HTN has increased. Moderate to severe   AS is present. The right ventricular systolic pressure was   increased consistent with moderate pulmonary hypertension.    Medications:     Scheduled Medications: . atorvastatin  20 mg Oral q1800  . calcium-vitamin D  1 tablet Oral Q breakfast  . dorzolamide-timolol  1 drop Both Eyes BID  . ferrous sulfate  325 mg Oral Q breakfast  . furosemide  80 mg Intravenous Q8H  . glipiZIDE  10 mg Oral Q breakfast  . insulin aspart  0-15 Units Subcutaneous TID WC  . latanoprost  1 drop Both Eyes QHS  . magnesium oxide  400 mg Oral Daily  . metolazone  2.5 mg Oral Once  . metoprolol succinate  25 mg Oral BID  . multivitamin with minerals  1 tablet Oral Daily  . pantoprazole  40 mg Oral Daily  . potassium chloride  40 mEq Oral Once  . potassium chloride  40 mEq Oral BID  . tiotropium  18 mcg Inhalation Daily    Infusions: . sodium chloride    . ferumoxytol Stopped (05/10/17 1119)    PRN Medications: sodium chloride, HYDROcodone-acetaminophen, ondansetron (ZOFRAN) IV    Patient Profile   Ms Pagaduan is an 82 year old with history of HTN, DMII, chronic a fib, NIMC. Severe MR with failed MV clip, porcelain aorta, copd, anemia, GI bleed 2018 with non bleeding gastric ulcer, AKI, pulmonary htn, and diastolic heart failure admitted with volume overload. Recent Echo with worsening AS.   Assessment/Plan   1. Acute on chronic diastolic CHF: Echo (1/44) with EF 30-35%, diffuse hypokinesis, improved to 45% on 3/17 TEE and then to 60-65% on 3/18 echo. EF remains normal at 55-60% on echo done in 7/18.  Nonischemic cardiomyopathy, no significant  coronary disease on angiography. Acute decompensation may be related to her valvular disease with worsening AS and ongoing MR.  She was admitted 8/1 with hypotension, AKI and volume overload.  She has diuresed well but is still volume overloaded by exam.  - Continue Lasix 80 mg q8 hrs and metolazone 2.5 again today => will hold Lasix after afternoon dose for cath tomorrow.   - With low BP and normal EF, stopped Entresto and spironolactone.  - Will use Toprol XL for rate control to see if more effective =>  increase to 50 mg bid.   - Place ted hose.  2. Atrial fibrillation: Chronic, HR mildly elevated.  - Hold warfarin for cath on Monday, pending INR today.  With anemia and ?GI bleeding will not bridge with heparin.  3. Aortic stenosis: Severe AS on TEE 8/3, no AI.  Suspect this has worsened.  I think valvular heart disease has triggered her decompensation.  - I think she will be a TAVR candidate.  Plan L/RHC on Monday and will consult valve team.   We discussed risks/benefits of cath and she agrees to proceed.  Creatinine has come back down.  4. Pulmonary hypertension: Mild to moderate pulmonary hypertension on RHC but PVR not significantly elevated, suspect pulmonary venous hypertension.  - No change.  5. Mitral regurgitation: Suspected rupture of an anterior leaflet chord with very eccentric mitral regurgitation.  MR was severe on 3/17 TEE.   Was seen for surgery eval for MV repair and AoV replacement.  However, given porcelain aorta, she was deemed not a candidate for open heart surgery. She was sent for percutaneous MV clipping.  This was attempted by Dr Bridgette Habermann at [facility] but procedure failed due to the pattern of MV calcification.  Subsequent TTEs were read as mild to moderate MR, but suspect they just did not visualize the MV effectively. TEE yesterday suggested ongoing severe MR with small flail segment of the anterior leaflet.  Unfortunately, there is now way to repair this.  May improve with  correction of severe AS.  6. COPD: Moderate to severe by PFTs. - Continue Spiriva  7. Anemia: Upper GI bleed in 3/18, possibly from gastric ulcer.  She is back on warfarin.  Recent colonoscopy with no bleeding source. Stool is still dark, hemoglobin is stably low.  - Got IV Fe.  8. Type II diabetes: - Continue SSI - Linagliptin stopped with CHF.  9. AKI: Creatinine up in setting of low BP and volume overload.  Suspect this was cardiorenal, now cr back down.  10. Insomnia: Trazodone prn.   Mobilize out of bed.   Length of Stay: 4  Loralie Champagne, MD  05/12/2017, 11:05 AM  Advanced Heart Failure Team Pager [phone] (M-F; 7a - 4p)  Please contact [facility] for night-coverage after hours (4p -7a ) and weekends on [email or web address]

## 2017-05-13 DIAGNOSIS — I482 Chronic atrial fibrillation: Secondary | ICD-10-CM

## 2017-05-13 LAB — BASIC METABOLIC PANEL
ANION GAP: 9 (ref 5–15)
BUN: 31 mg/dL — ABNORMAL HIGH (ref 6–20)
CHLORIDE: 97 mmol/L — AB (ref 101–111)
CO2: 37 mmol/L — ABNORMAL HIGH (ref 22–32)
Calcium: 9.7 mg/dL (ref 8.9–10.3)
Creatinine, Ser: 1.41 mg/dL — ABNORMAL HIGH (ref 0.44–1.00)
GFR calc Af Amer: 40 mL/min — ABNORMAL LOW (ref >60)
GFR, EST NON AFRICAN AMERICAN: 34 mL/min — AB (ref >60)
Glucose, Bld: 194 mg/dL — ABNORMAL HIGH (ref 65–99)
POTASSIUM: 4.6 mmol/L (ref 3.5–5.1)
SODIUM: 143 mmol/L (ref 135–145)

## 2017-05-13 LAB — PROTIME-INR
INR: 2.05
PROTHROMBIN TIME: 23.5 s — AB (ref 11.4–15.2)

## 2017-05-13 LAB — CBC
HCT: 30.8 % — ABNORMAL LOW (ref 36.0–46.0)
HEMOGLOBIN: 8.9 g/dL — AB (ref 12.0–15.0)
MCH: 26.5 pg (ref 26.0–34.0)
MCHC: 28.9 g/dL — ABNORMAL LOW (ref 30.0–36.0)
MCV: 91.7 fL (ref 78.0–100.0)
PLATELETS: 172 10*3/uL (ref 150–400)
RBC: 3.36 MIL/uL — AB (ref 3.87–5.11)
RDW: 15.3 % (ref 11.5–15.5)
WBC: 7.9 10*3/uL (ref 4.0–10.5)

## 2017-05-13 LAB — GLUCOSE, CAPILLARY
GLUCOSE-CAPILLARY: 187 mg/dL — AB (ref 65–99)
GLUCOSE-CAPILLARY: 204 mg/dL — AB (ref 65–99)
Glucose-Capillary: 115 mg/dL — ABNORMAL HIGH (ref 65–99)
Glucose-Capillary: 202 mg/dL — ABNORMAL HIGH (ref 65–99)

## 2017-05-13 MED ORDER — ALUM & MAG HYDROXIDE-SIMETH 200-200-20 MG/5ML PO SUSP
30.0000 mL | Freq: Four times a day (QID) | ORAL | Status: DC | PRN
  Administered 2017-05-13: 30 mL via ORAL
  Filled 2017-05-13: qty 30

## 2017-05-13 MED ORDER — ALBUTEROL SULFATE (2.5 MG/3ML) 0.083% IN NEBU
2.5000 mg | INHALATION_SOLUTION | RESPIRATORY_TRACT | Status: DC | PRN
  Administered 2017-05-14 (×2): 2.5 mg via RESPIRATORY_TRACT
  Filled 2017-05-13 (×2): qty 3

## 2017-05-13 MED ORDER — POTASSIUM CHLORIDE CRYS ER 20 MEQ PO TBCR
40.0000 meq | EXTENDED_RELEASE_TABLET | Freq: Every day | ORAL | Status: DC
  Administered 2017-05-14 – 2017-05-17 (×4): 40 meq via ORAL
  Filled 2017-05-13 (×4): qty 2

## 2017-05-13 MED ORDER — TORSEMIDE 20 MG PO TABS
80.0000 mg | ORAL_TABLET | Freq: Two times a day (BID) | ORAL | Status: DC
  Administered 2017-05-13 (×2): 80 mg via ORAL
  Filled 2017-05-13 (×2): qty 4

## 2017-05-13 MED ORDER — METOPROLOL SUCCINATE ER 50 MG PO TB24
75.0000 mg | ORAL_TABLET | Freq: Two times a day (BID) | ORAL | Status: DC
  Administered 2017-05-13 – 2017-05-17 (×8): 75 mg via ORAL
  Filled 2017-05-13 (×8): qty 1

## 2017-05-13 NOTE — Progress Notes (Signed)
Physical Therapy Treatment Patient Details Name: Destiny [name] MRN: [id number] DOB: 20-Feb-1935 Today's Date: 05/13/2017    History of Present Illness Ms Sara is an 82 year old with history of HTN, DMII, chronic a fib, NIMC. Severe MR with failed MV clip, porcelain aorta, copd, anemia, GI bleed 2018 with non bleeding gastric ulcer, AKI, pulmonary htn, and diastolic heart failure admitted with volume overload. Recent Echo with worsening AS. TEE performed, plan for [facility] on 05/13/17 and possible TAVR candidate.    PT Comments    Pt making slow progress. Functional activity tolerance remains low. Pt has no interest in going anywhere but home. Not ideal for her to be home alone. Recommend maximal home health support.   Follow Up Recommendations  Home health PT (Pt adamently refusing SNF)     Equipment Recommendations  None recommended by PT    Recommendations for Other Services       Precautions / Restrictions Precautions Precautions: Fall Precaution Comments: watch O2 sats Restrictions Weight Bearing Restrictions: No    Mobility  Bed Mobility               General bed mobility comments: Pt up in chair  Transfers Overall transfer level: Needs assistance Equipment used: Rolling walker (2 wheeled) Transfers: Sit to/from Stand Sit to Stand: Min guard         General transfer comment: pt has trouble getting up from chair. First time needed min A, second time just light min. Once she is up, min-guard to pivot to [facility]  Ambulation/Gait Ambulation/Gait assistance: Min guard Ambulation Distance (Feet): 100 Feet Assistive device: Rolling walker (2 wheeled) Gait Pattern/deviations: Trunk flexed;Step-through pattern;Shuffle;Decreased step length - right;Decreased step length - left Gait velocity: decreased Gait velocity interpretation: <1.8 ft/sec, indicative of risk for recurrent falls General Gait Details: Assist for safety. Pt amb on 2L of O2. Unable to obtain good  wave form with SpO2 during amb but SpO2 98% when returned to room.   Stairs            Wheelchair Mobility    Modified Rankin (Stroke Patients Only)       Balance Overall balance assessment: Needs assistance Sitting-balance support: No upper extremity supported Sitting balance-Leahy Scale: Good     Standing balance support: No upper extremity supported Standing balance-Leahy Scale: Fair Standing balance comment: static standing with supervision                            Cognition Arousal/Alertness: Awake/alert Behavior During Therapy: Flat affect Overall Cognitive Status: Within Functional Limits for tasks assessed                                        Exercises      General Comments        Pertinent Vitals/Pain Pain Assessment: No/denies pain    Home Living                      Prior Function            PT Goals (current goals can now be found in the care plan section) Progress towards PT goals: Progressing toward goals    Frequency    Min 3X/week      PT Plan Current plan remains appropriate    Co-evaluation  AM-PAC PT "6 Clicks" Daily Activity  Outcome Measure  Difficulty turning over in bed (including adjusting bedclothes, sheets and blankets)?: A Little Difficulty moving from lying on back to sitting on the side of the bed? : A Little Difficulty sitting down on and standing up from a chair with arms (e.g., wheelchair, bedside commode, etc,.)?: Total Help needed moving to and from a bed to chair (including a wheelchair)?: A Little Help needed walking in hospital room?: A Little Help needed climbing 3-5 steps with a railing? : A Lot 6 Click Score: 15    End of Session Equipment Utilized During Treatment: Gait belt;Oxygen Activity Tolerance: Patient limited by fatigue Patient left: in chair;with call bell/phone within reach Nurse Communication: Mobility status PT Visit  Diagnosis: Unsteadiness on feet (R26.81);Muscle weakness (generalized) (M62.81);Difficulty in walking, not elsewhere classified (R26.2)     Time: 7893-8101 PT Time Calculation (min) (ACUTE ONLY): 24 min  Charges:  $Gait Training: 23-37 mins                    G Codes:       [facility] PT [place] 05/13/2017, 4:50 PM

## 2017-05-13 NOTE — Consult Note (Signed)
Cardiology Consultation:   Patient ID: SHERAL [name]; [id number]; [birth year]   Admit date: 05/08/2017 Date of Consult: 05/13/2017  Primary Care Provider: Curly Rim, MD Primary Cardiologist: Johnsie Cancel CHF Cardiologist: Aundra Dubin   Patient Profile:   Destiny [name] is a 82 y.o. female with a hx of mild non-obstructive CAD, Non-ischemic cardiomyopathy, diabetes mellitus, chronic atrial fibrillation, severe mitral valve regurgitation, severe aortic valve stenosis, COPD, anemia, GI bleeding and pulmonary HTN who was admitted by the CHF service 05/08/17 with c/o dyspnea and found to be volume overloaded. I am asked to see her today by Dr. Aundra Dubin for discussion regarding her aortic stenosis and possible TAVR.   History of Present Illness:   Destiny [name] is a 82 y.o. female with a hx of mild non-obstructive CAD, Non-ischemic cardiomyopathy, diabetes mellitus, chronic atrial fibrillation, severe mitral valve regurgitation, severe aortic valve stenosis, COPD, anemia, GI bleeding and pulmonary HTN who was admitted by the CHF service 05/08/17 with c/o dyspnea and found to be volume overloaded. I am asked to see her today by Dr. Aundra Dubin for discussion regarding her aortic stenosis and possible TAVR. She has been known to have aortic stenosis and severe mitral regurgitation dating back to 2017. She failed percutaneous clipping of the MV valve in [place], [place]. She was seen by Dr. Roxy Manns in our [place] surgery office in 2017 and was not felt to be a candidate for open surgical AVR and MVR given her porcelain aorta. TEE 05/10/17 with LVEF=55-60%, low gradient but felt to be severe AS with no AI, severe eccentric MR. She has been diuresed and has had clinical improvement. She has atrial fibrillation which is chronic and is on coumadin at home. She has at least moderate COPD.   She tells me today that she has had progressive dyspnea and lower extremity edema. She has no chest pain. She lives alone. She is a widow. She  states that she has little support from family. She has a child who lives in the area but it not involved in her day to day care. She still drives/cooks/cleans.   Past Medical History:  Diagnosis Date  . Anemia   . Aortic calcification (HCC) 02/20/2016   Involving entire thoracic and abdominal aorta  . Arthritis   . Atrial fibrillation ([place]) 1999-diagnosed  . Complication of anesthesia    slow to awaken (05/08/2017)  . Diastolic CHF, chronic ([place]) 12/30/2014  . Edema   . Glaucoma, both eyes   . History of blood transfusion 03/2017   "related to anemia"  . History of kidney stones   . Incidental pulmonary nodule    Results of CT scan:  Lungs/Pleura: There is some progressive scarring and reticulonodular opacity in the left upper lobe and lingula extending to the anterior pleural surface. There is some associated mild traction bronchiectasis in the lingula. Findings are suggestive of postinflammatory/postinfectious changes. Recommend correlation with any active infectious symptoms. CT follow-up would be appro  . Mitral valve insufficiency and aortic valve insufficiency   . Mononeuritis of unspecified site   . Osteoarthrosis, unspecified whether generalized or localized, unspecified site    "all over" (05/08/2017)  . Pure hypercholesterolemia   . Skin cancer    left temple  . Type II diabetes mellitus ([place])   . Unspecified essential hypertension     Past Surgical History:  Procedure Laterality Date  . ABDOMINAL HYSTERECTOMY  2000   partial  . APPENDECTOMY  1954  . CARDIAC CATHETERIZATION N/A 11/16/2015   Procedure: Right/Left  Heart Cath and Coronary Angiography;  Surgeon: Larey Dresser, MD;  Location: [place] [facility];  Service: Cardiovascular;  Laterality: N/A;  . CATARACT EXTRACTION W/ INTRAOCULAR LENS IMPLANT Left   . CYSTOSCOPY W/ RETROGRADES Right 11/26/2012   Procedure: CYSTOSCOPY WITH RETROGRADE PYELOGRAM;  Surgeon: Alexis Frock, MD;  Location: [facility];   Service: Urology;  Laterality: Right;  . ESOPHAGOGASTRODUODENOSCOPY (EGD) WITH PROPOFOL N/A 01/03/2017   Procedure: ESOPHAGOGASTRODUODENOSCOPY (EGD) WITH PROPOFOL;  Surgeon: Clarene Essex, MD;  Location: [facility] ENDOSCOPY;  Service: Endoscopy;  Laterality: N/A;  . HOLMIUM LASER APPLICATION Right 11/19/2480   Procedure: HOLMIUM LASER APPLICATION;  Surgeon: Alexis Frock, MD;  Location: [facility];  Service: Urology;  Laterality: Right;  . SKIN CANCER EXCISION Left    temple  . TEE WITHOUT CARDIOVERSION N/A 12/08/2015   Procedure: TRANSESOPHAGEAL ECHOCARDIOGRAM (TEE);  Surgeon: Larey Dresser, MD;  Location: [place];  Service: Cardiovascular;  Laterality: N/A;  . TEE WITHOUT CARDIOVERSION N/A 05/10/2017   Procedure: TRANSESOPHAGEAL ECHOCARDIOGRAM (TEE);  Surgeon: Larey Dresser, MD;  Location: [facility] ENDOSCOPY;  Service: Cardiovascular;  Laterality: N/A;  . TONSILLECTOMY  1943  . TOTAL KNEE ARTHROPLASTY Right 2009  . TRANSESOPHAGEAL ECHOCARDIOGRAM  02/2012     Inpatient Medications: Scheduled Meds: . atorvastatin  20 mg Oral q1800  . calcium-vitamin D  1 tablet Oral Q breakfast  . dorzolamide-timolol  1 drop Both Eyes BID  . ferrous sulfate  325 mg Oral Q breakfast  . glipiZIDE  10 mg Oral Q breakfast  . insulin aspart  0-15 Units Subcutaneous TID WC  . latanoprost  1 drop Both Eyes QHS  . magnesium oxide  400 mg Oral Daily  . metoprolol succinate  75 mg Oral BID  . multivitamin with minerals  1 tablet Oral Daily  . pantoprazole  40 mg Oral Daily  . [START ON 05/14/2017] potassium chloride  40 mEq Oral Daily  . sodium chloride flush  3 mL Intravenous Q12H  . tiotropium  18 mcg Inhalation Daily  . torsemide  80 mg Oral BID   Continuous Infusions: . sodium chloride    . sodium chloride    . sodium chloride 10 mL/hr (05/13/17 0628)  . ferumoxytol Stopped (05/10/17 1119)   PRN Meds: sodium chloride, sodium chloride, alum & mag hydroxide-simeth, HYDROcodone-acetaminophen,  ondansetron (ZOFRAN) IV, sodium chloride flush, traZODone  Allergies:   No Known Allergies  Social History:   Social History   Social History  . Marital status: Widowed    Spouse name: N/A  . Number of children: N/A  . Years of education: N/A   Occupational History  . Not on file.   Social History Main Topics  . Smoking status: Former Smoker    Packs/day: 3.50    Years: 34.00    Types: Cigarettes    Quit date: 07/11/1988  . Smokeless tobacco: Never Used  . Alcohol use Yes     Comment: 05/08/2017 "none in ages"  . Drug use: No  . Sexual activity: No   Other Topics Concern  . Not on file   Social History Narrative   Widow. Lives alone. Attended business college.   Lives in one story home. Has living will.    Walker and/or four pronged cane for assistance.    Former smoker of 29 years. Quit smoking 1989.   Patient drinks caffeinated beverages, takes a daily vitamin.   Wears her seatbelt, wears dentures, smoke detector located in the home.  Family History:   Family History  Problem Relation Age of Onset  . Cancer Mother 32       leukemia   . Breast cancer Neg Hx   . Colon cancer Neg Hx      ROS:  Please see the history of present illness.  ROS  All other ROS reviewed and negative.     Physical Exam/Data:   Vitals:   05/13/17 0742 05/13/17 0900 05/13/17 1227 05/13/17 2015  BP: 112/80 112/77 106/69 111/68  Pulse: 82 89 95 (!) 115  Resp: 19  20 20   Temp: 98.1 F (36.7 C)  97.6 F (36.4 C) 99.3 F (37.4 C)  TempSrc: Oral  Oral Oral  SpO2: 98% 98% 97% 98%  Weight:      Height:        Intake/Output Summary (Last 24 hours) at 05/13/17 2121 Last data filed at 05/13/17 2016  Gross per 24 hour  Intake              403 ml  Output             1900 ml  Net            -1497 ml   Filed Weights   05/10/17 0335 05/11/17 0500 05/13/17 0439  Weight: 164 lb 4.8 oz (74.5 kg) 161 lb 9.6 oz (73.3 kg) 155 lb 1.6 oz (70.4 kg)   Body mass index is 30.29 kg/m.    General:  Well nourished, well developed, in no acute distress HEENT: normal Lymph: no adenopathy Neck: + JVD Endocrine:  No thryomegaly Vascular: No carotid bruits; FA pulses 2+ bilaterally without bruits  Cardiac:  normal S1, S2; RRR; loud systolic murmur heard from the apex to the right precordial border Lungs:  clear to auscultation bilaterally, no wheezing, rhonchi or rales  Abd: soft, nontender, no hepatomegaly  Ext: Trace bilateral LE edema Musculoskeletal:  No deformities, BUE and BLE strength normal and equal Skin: warm and dry  Neuro:  CNs 2-12 intact, no focal abnormalities noted Psych:  Normal affect   EKG:  The EKG from 05/08/17 was personally reviewed and demonstrates:  Atrial fib, IVCD, LaFB. Non-specific ST T abd Telemetry:  Telemetry was personally reviewed and demonstrates:  Atrial fib, rate 120s  Relevant CV Studies: Echo 05/03/17: Left ventricle: The cavity size was normal. There was moderate   concentric hypertrophy. Systolic function was normal. The   estimated ejection fraction was in the range of 55% to 60%. Wall   motion was normal; there were no regional wall motion   abnormalities. The study was not technically sufficient to allow   evaluation of LV diastolic dysfunction due to atrial   fibrillation. - Aortic valve: There is moderate to severe AS. The mean AV   gradient is 51mmHg but the calculated AVA is 0.73cm2. Visually   the valve appears moderate to severely stenotic. Valve mobility   was restricted. There was moderate to severe stenosis. There was   moderate to severe regurgitation. Mean gradient (S): 20 mm Hg.   VTI ratio of LVOT to aortic valve: 0.29. Valve area (VTI): 0.73   cm^2. Valve area (Vmax): 0.9 cm^2. Valve area (Vmean): 0.81 cm^2. - Mitral valve: Calcified annulus. There was mild to moderate   regurgitation directed eccentrically and posteriorly. Valve area   by continuity equation (using LVOT flow): 1.38 cm^2. - Left atrium: The atrium  was massively dilated. - Right ventricle: The cavity size was mildly dilated. Wall   thickness  was normal. - Right atrium: The atrium was massively dilated. - Atrial septum: There was a small secundum atrial septal defect.   There was a left-to-right shunt through an atrial septal defect,   in the baseline state. - Tricuspid valve: There was moderate-severe regurgitation. - Pulmonary arteries: PA peak pressure: 64 mm Hg (S). - Pericardium, extracardiac: A trivial, free-flowing pericardial   effusion was identified along the left ventricular free wall.  Impressions:  - Compared to prior echo, the aortic insufficiency has worsened, TR   has worsened and pulmonary HTN has increased. Moderate to severe   AS is present. The right ventricular systolic pressure was   increased consistent with moderate pulmonary hypertension.  Laboratory Data:  Chemistry  Recent Labs Lab 05/11/17 0408 05/12/17 0246 05/13/17 0704  NA 141 141 143  K 3.7 3.4* 4.6  CL 99* 95* 97*  CO2 34* 37* 37*  GLUCOSE 234* 130* 194*  BUN 28* 28* 31*  CREATININE 1.26* 1.25* 1.41*  CALCIUM 9.4 9.6 9.7  GFRNONAA 39* 40* 34*  GFRAA 45* 46* 40*  ANIONGAP 8 9 9      Recent Labs Lab 05/08/17 1247  PROT 6.4*  ALBUMIN 3.4*  AST 22  ALT 11*  ALKPHOS 42  BILITOT 0.9   Hematology  Recent Labs Lab 05/11/17 0408 05/12/17 0246 05/13/17 0704  WBC 6.3 7.3 7.9  RBC 3.10* 3.17* 3.36*  HGB 8.5* 8.4* 8.9*  HCT 28.6* 28.6* 30.8*  MCV 92.3 90.2 91.7  MCH 27.4 26.5 26.5  MCHC 29.7* 29.4* 28.9*  RDW 15.5 15.3 15.3  PLT 168 187 172    Radiology/Studies:  No results found.  Assessment and Plan:   1. Severe aortic valve stenosis: She has stage D symptomatic aortic valve stenosis. I have personally reviewed the echo images. The aortic valve is thickened and calcified with limited leaflet mobility. She also has moderate to severe eccentric MR. She is not felt to be a candidate for MVR by traditional surgical  approach. She has failed attempt at MV clipping in [place], [place] and that is now not an option. It is possible that treating her aortic stenosis would give considerable benefit in regards to her decompensation and acute CHF. She is not a candidate for surgical AVR/MVR due to her porcelain aorta. She has been evaluated in 2017 by Dr. Roxy Manns. She may be a candidate for TAVR. I have reviewed the TAVR procedure in detail today with the patient. She is not sure if she would like to proceed with planning for TAVR. Dr. Aundra Dubin is planning a right and left heart cath tomorrow if her INR is below 1.8. She has evidence of renal insufficiency with the current diuresis. If she decides to pursue TAVR, we will have to stage her CT scans in preparation for TAVR pending renal function. It would be best if we could diurese her and then begin working on her pre-TAVR studies either as an inpatient or outpatient. If we could get her back to baseline in regards to her respiratory status and plan outpatient TAVR, that would be optimal. I have asked Dr. Cyndia Bent to also see her this week to give a current surgical opinion in regards to her AS/MR since Dr. Roxy Manns is out of town.       Signed, Lauree Chandler, MD  05/13/2017 9:21 PM

## 2017-05-13 NOTE — Progress Notes (Signed)
Inpatient Diabetes Program Recommendations  AACE/ADA: New Consensus Statement on Inpatient Glycemic Control (2015)  Target Ranges:  Prepandial:   less than 140 mg/dL      Peak postprandial:   less than 180 mg/dL (1-2 hours)      Critically ill patients:  140 - 180 mg/dL   Results for MANNAT, BENEDETTI (MRN [id number]) as of 05/13/2017 15:16  Ref. Range 05/12/2017 07:55 05/12/2017 11:21 05/12/2017 16:22 05/12/2017 22:05  Glucose-Capillary Latest Ref Range: 65 - 99 mg/dL 149 (H) 218 (H) 219 (H) 178 (H)   Results for LATRENA, BENEGAS (MRN [id number]) as of 05/13/2017 15:16  Ref. Range 05/13/2017 07:38 05/13/2017 12:25  Glucose-Capillary Latest Ref Range: 65 - 99 mg/dL 187 (H) 204 (H)    Admit with: CHF  History: DM  Home DM Meds: Glipizide 10 mg daily       Tradjenta 5 mg daily       Metformin 250 mg BID  Current Insulin Orders: Glipizide 10 mg daily      Novolog Moderate Correction Scale/ SSI (0-15 units) TID AC       MD- Please consider starting low dose Novolog Meal Coverage for patient tomorrow after her Cardiac Cath-  Recommend Novolog 3 units TID with meals (hold if pt eats <50% of meal)     --Will follow patient during hospitalization--  Wyn Quaker RN, MSN, CDE Diabetes Coordinator Inpatient Glycemic Control Team Team Pager: [phone] (8a-5p)

## 2017-05-13 NOTE — Progress Notes (Signed)
Patient ID: Destiny [name], female   DOB: 1935-06-18, 82 y.o.   MRN: [id number]     Advanced Heart Failure Rounding Note  PCP: Dr Neta Mends  Primary Cardiologist: Dr Aundra Dubin  Subjective:    Says she doesn't feel well today, complains of being cold all night. Breathing feels much improved, no orthopnea. Weight down 13 pounds total. INR is 2.0, will hold cath for today.   TEE: EF 55-60%, mildly dilated RV with normal systolic function, low gradient severe AS no AI, suspect severe eccentric MR from suspected small flail segment on anterior leaflet. Small ASD.    Objective:   Weight Range: 155 lb 1.6 oz (70.4 kg) Body mass index is 30.29 kg/m.   Vital Signs:   Temp:  [97.9 F (36.6 C)-98.7 F (37.1 C)] 98.4 F (36.9 C) (08/06 0439) Pulse Rate:  [78-148] 78 (08/06 0439) Resp:  [17-18] 18 (08/05 1624) BP: (105-130)/(60-88) 116/82 (08/06 0439) SpO2:  [92 %-100 %] 93 % (08/06 0439) Weight:  [155 lb 1.6 oz (70.4 kg)] 155 lb 1.6 oz (70.4 kg) (08/06 0439) Last BM Date: 05/10/17  Weight change: Filed Weights   05/10/17 0335 05/11/17 0500 05/13/17 0439  Weight: 164 lb 4.8 oz (74.5 kg) 161 lb 9.6 oz (73.3 kg) 155 lb 1.6 oz (70.4 kg)    Intake/Output:   Intake/Output Summary (Last 24 hours) at 05/13/17 0716 Last data filed at 05/13/17 0448  Gross per 24 hour  Intake              740 ml  Output             1250 ml  Net             -510 ml      Physical Exam    General: Elderly female, NAD. Sitting in chair.  Neck: JVP 10 cm.  no thyromegaly or thyroid nodule.  Lungs: Clear in upper lobes, diminished in bases, but clear.  CV: Nondisplaced PMI.  Irregularly irregular.  S1/S2, no S3/S4, 3/6 SEM RUSB. 1+ pedal edema.  Abdomen: Soft, non tender, non distended. No hepatosplenomegaly. Skin: Intact without lesions or rashes.  Neurologic: Alert and oriented x 3.  Psych: Normal affect. Extremities: No clubbing or cyanosis. Warm.  HEENT: Normal.   Telemetry   Afib 100-110's,  personally reviewed.   Labs    CBC  Recent Labs  05/11/17 0408 05/12/17 0246  WBC 6.3 7.3  HGB 8.5* 8.4*  HCT 28.6* 28.6*  MCV 92.3 90.2  PLT 168 474   Basic Metabolic Panel  Recent Labs  05/11/17 0408 05/12/17 0246  NA 141 141  K 3.7 3.4*  CL 99* 95*  CO2 34* 37*  GLUCOSE 234* 130*  BUN 28* 28*  CREATININE 1.26* 1.25*  CALCIUM 9.4 9.6   Liver Function Tests No results for input(s): AST, ALT, ALKPHOS, BILITOT, PROT, ALBUMIN in the last 72 hours.  BNP: BNP (last 3 results)  Recent Labs  11/02/16 1228 05/08/17 1247 05/08/17 1612  BNP 324.3* 685.7* 863.4*       Imaging   Transthoracic Echocardiography 05/03/17   Study Conclusions  - Left ventricle: The cavity size was normal. There was moderate   concentric hypertrophy. Systolic function was normal. The   estimated ejection fraction was in the range of 55% to 60%. Wall   motion was normal; there were no regional wall motion   abnormalities. The study was not technically sufficient to allow   evaluation of LV diastolic dysfunction due  to atrial   fibrillation. - Aortic valve: There is moderate to severe AS. The mean AV   gradient is 76mmHg but the calculated AVA is 0.73cm2. Visually   the valve appears moderate to severely stenotic. Valve mobility   was restricted. There was moderate to severe stenosis. There was   moderate to severe regurgitation. Mean gradient (S): 20 mm Hg.   VTI ratio of LVOT to aortic valve: 0.29. Valve area (VTI): 0.73   cm^2. Valve area (Vmax): 0.9 cm^2. Valve area (Vmean): 0.81 cm^2. - Mitral valve: Calcified annulus. There was mild to moderate   regurgitation directed eccentrically and posteriorly. Valve area   by continuity equation (using LVOT flow): 1.38 cm^2. - Left atrium: The atrium was massively dilated. - Right ventricle: The cavity size was mildly dilated. Wall   thickness was normal. - Right atrium: The atrium was massively dilated. - Atrial septum: There was  a small secundum atrial septal defect.   There was a left-to-right shunt through an atrial septal defect,   in the baseline state. - Tricuspid valve: There was moderate-severe regurgitation. - Pulmonary arteries: PA peak pressure: 64 mm Hg (S). - Pericardium, extracardiac: A trivial, free-flowing pericardial   effusion was identified along the left ventricular free wall.  Impressions:  - Compared to prior echo, the aortic insufficiency has worsened, TR   has worsened and pulmonary HTN has increased. Moderate to severe   AS is present. The right ventricular systolic pressure was   increased consistent with moderate pulmonary hypertension.    Medications:     Scheduled Medications: . atorvastatin  20 mg Oral q1800  . calcium-vitamin D  1 tablet Oral Q breakfast  . dorzolamide-timolol  1 drop Both Eyes BID  . ferrous sulfate  325 mg Oral Q breakfast  . glipiZIDE  10 mg Oral Q breakfast  . insulin aspart  0-15 Units Subcutaneous TID WC  . latanoprost  1 drop Both Eyes QHS  . magnesium oxide  400 mg Oral Daily  . metoprolol succinate  50 mg Oral BID  . multivitamin with minerals  1 tablet Oral Daily  . pantoprazole  40 mg Oral Daily  . potassium chloride  40 mEq Oral BID  . sodium chloride flush  3 mL Intravenous Q12H  . tiotropium  18 mcg Inhalation Daily    Infusions: . sodium chloride    . sodium chloride    . sodium chloride 10 mL/hr (05/13/17 0628)  . ferumoxytol Stopped (05/10/17 1119)    PRN Medications: sodium chloride, sodium chloride, HYDROcodone-acetaminophen, ondansetron (ZOFRAN) IV, sodium chloride flush, traZODone    Patient Profile   Destiny [name] is an 82 year old with history of HTN, DMII, chronic a fib, NIMC. Severe MR with failed MV clip, porcelain aorta, copd, anemia, GI bleed 2018 with non bleeding gastric ulcer, AKI, pulmonary htn, and diastolic heart failure admitted with volume overload. Recent Echo with worsening AS.   Assessment/Plan   1.  Acute on chronic diastolic CHF: Echo (8/14) with EF 30-35%, diffuse hypokinesis, improved to 45% on 3/17 TEE and then to 60-65% on 3/18 echo. EF remains normal at 55-60% on echo done in 7/18.  Nonischemic cardiomyopathy, no significant coronary disease on angiography. Acute decompensation may be related to her valvular disease with worsening AS and ongoing MR.  She was admitted 8/1 with hypotension, AKI and volume overload.  - Weight down 13 pounds, volume status much improved. Home dose torsemide 80 mg BID, Will start this today.  -  With normal EF, no Entresto.  - Continue Toprol XL 50 mg daily.  - Consider adding Arlyce Harman soon.    2. Atrial fibrillation: Chronic, HR mildly elevated.  - Rate remains in the 100's. Consider increasing Toprol.  - Warfarin on hold for cath.  3. Aortic stenosis: Severe AS on TEE 8/3, no AI.  Suspect this has worsened.  Likely that valvular heart disease has triggered her decompensation.  - Per Dr. Aundra Dubin - R/L heart cath tomorrow, patient could be a TAVR candidate.   4. Pulmonary hypertension: Mild to moderate pulmonary hypertension on RHC but PVR not significantly elevated, suspect pulmonary venous hypertension.  - No change.   5. Mitral regurgitation: Per Dr. Loni Muse - Suspected rupture of an anterior leaflet chord with very eccentric mitral regurgitation.  MR was severe on 3/17 TEE.   Was seen for surgery eval for MV repair and AoV replacement.  However, given porcelain aorta, she was deemed not a candidate for open heart surgery. She was sent for percutaneous MV clipping.  This was attempted by Dr Bridgette Habermann at [facility] but procedure failed due to the pattern of MV calcification.  Subsequent TTEs were read as mild to moderate MR, but suspect they just did not visualize the MV effectively. TEE last week suggested ongoing severe MR with small flail segment of the anterior leaflet.  Unfortunately, there is now way to repair this.  May improve with correction of severe AS.  - No  change.   6. COPD: Moderate to severe by PFTs. - Continue Spiriva   7. Anemia: Upper GI bleed in 3/18, possibly from gastric ulcer.  She is back on warfarin.  Recent colonoscopy with no bleeding source. Stool is still dark, hemoglobin is stably low.  - hgb stable at 8.4.   8. Type II diabetes: - Continue SSI - Linagliptin stopped with CHF.  - No change.   9. AKI: Creatinine up in setting of low BP and volume overload.   - Improved.   10. Insomnia: - Continue Trazodone.   PT recommends home health.   Length of Stay: [place], NP  05/13/2017, 7:16 AM  Advanced Heart Failure Team Pager [phone] (M-F; 7a - 4p)  Please contact [facility] for night-coverage after hours (4p -7a ) and weekends on [email or web address]  Patient seen with NP, agree with the above note.  Hold off on RHC/LHC today with INR 2.05, let INR continue to drift down and plan for tomorrow if INR < 1.8.    HR mildly elevated, BP stable.  Increase Toprol XL to 75 mg bid.   Creatinine up to 1.4.  Suspect mild volume overload still present but will transition back to torsemide 80 mg po bid pre-cath.  Will see what pressures look like on RHC and may have to restart IV Lasix.   Severe AS, asked Dr. Angelena Form to see for TAVR evaluation .  Loralie Champagne 05/13/2017 4:58 PM

## 2017-05-13 NOTE — Progress Notes (Signed)
CARDIAC REHAB PHASE I   PRE:  Rate/Rhythm: 117 afib with occ PVC    BP: sitting 115/70    SaO2: 98 2L  MODE:  Ambulation: to door   POST:  Rate/Rhythm: 156 afib max    BP: sitting 112/77     SaO2: 98 2L  Pt feeling poorly, reluctant to walk. Used RW, 2L, assist x2 with gait belt. Fairly steady with RW however pt very fatigued with distance/SOB. HR up to 150s at times. Pt not conversant, flat. Turned around at door of room and pt able to make it back. Still not conversing with [place] in chair, SOB, fatigued. When asked what her biggest c/o is, she sts her back pain "where I fell". Did not tolerate well, slow to recover. ? pts appropriateness for ambulation with severe valve disease. Reid, ACSM 05/13/2017 9:08 AM

## 2017-05-14 LAB — BASIC METABOLIC PANEL
Anion gap: 12 (ref 5–15)
Anion gap: 9 (ref 5–15)
BUN: 36 mg/dL — AB (ref 6–20)
BUN: 34 mg/dL — ABNORMAL HIGH (ref 6–20)
CALCIUM: 9.4 mg/dL (ref 8.9–10.3)
CHLORIDE: 95 mmol/L — AB (ref 101–111)
CO2: 36 mmol/L — ABNORMAL HIGH (ref 22–32)
CO2: 37 mmol/L — AB (ref 22–32)
CREATININE: 1.6 mg/dL — AB (ref 0.44–1.00)
CREATININE: 1.5 mg/dL — AB (ref 0.44–1.00)
Calcium: 9.5 mg/dL (ref 8.9–10.3)
Chloride: 93 mmol/L — ABNORMAL LOW (ref 101–111)
GFR calc Af Amer: 34 mL/min — ABNORMAL LOW (ref >60)
GFR calc non Af Amer: 32 mL/min — ABNORMAL LOW (ref >60)
GFR, EST AFRICAN AMERICAN: 37 mL/min — AB (ref >60)
GFR, EST NON AFRICAN AMERICAN: 29 mL/min — AB (ref >60)
Glucose, Bld: 161 mg/dL — ABNORMAL HIGH (ref 65–99)
Glucose, Bld: 152 mg/dL — ABNORMAL HIGH (ref 65–99)
POTASSIUM: 4.2 mmol/L (ref 3.5–5.1)
POTASSIUM: 4.5 mmol/L (ref 3.5–5.1)
SODIUM: 141 mmol/L (ref 135–145)
SODIUM: 141 mmol/L (ref 135–145)

## 2017-05-14 LAB — GLUCOSE, CAPILLARY
GLUCOSE-CAPILLARY: 138 mg/dL — AB (ref 65–99)
GLUCOSE-CAPILLARY: 164 mg/dL — AB (ref 65–99)
GLUCOSE-CAPILLARY: 168 mg/dL — AB (ref 65–99)
GLUCOSE-CAPILLARY: 176 mg/dL — AB (ref 65–99)
GLUCOSE-CAPILLARY: 238 mg/dL — AB (ref 65–99)

## 2017-05-14 LAB — CBC
HCT: 30.3 % — ABNORMAL LOW (ref 36.0–46.0)
Hemoglobin: 9.1 g/dL — ABNORMAL LOW (ref 12.0–15.0)
MCH: 27.7 pg (ref 26.0–34.0)
MCHC: 30 g/dL (ref 30.0–36.0)
MCV: 92.4 fL (ref 78.0–100.0)
PLATELETS: 179 10*3/uL (ref 150–400)
RBC: 3.28 MIL/uL — AB (ref 3.87–5.11)
RDW: 15.9 % — AB (ref 11.5–15.5)
WBC: 6.8 10*3/uL (ref 4.0–10.5)

## 2017-05-14 LAB — PROTIME-INR
INR: 1.96
INR: 2.13
PROTHROMBIN TIME: 22.7 s — AB (ref 11.4–15.2)
Prothrombin Time: 24.2 seconds — ABNORMAL HIGH (ref 11.4–15.2)

## 2017-05-14 MED ORDER — TORSEMIDE 20 MG PO TABS
80.0000 mg | ORAL_TABLET | Freq: Once | ORAL | Status: AC
  Administered 2017-05-14: 80 mg via ORAL
  Filled 2017-05-14: qty 4

## 2017-05-14 MED ORDER — SODIUM CHLORIDE 0.9 % IV SOLN
INTRAVENOUS | Status: AC
  Administered 2017-05-14: 08:00:00 via INTRAVENOUS

## 2017-05-14 NOTE — Progress Notes (Signed)
Spoke with  Dr Kenton Kingfisher from Heart care and informed him of patient;s inability to get air in ; had to reinforce deep breathing exercises and patient started to feel better, will continue.

## 2017-05-14 NOTE — Progress Notes (Signed)
Pt c/o of SOB. Pt given albuterol treatment. Pt's O2 sats was 98 % on 4 L [place]. Pt stated she felt better after the treatment. Will continue to monitor the pt. Pt was resting comfortably when RN left the room. Hoover Brunette, RN

## 2017-05-14 NOTE — Progress Notes (Signed)
Patient ID: Destiny [name], female   DOB: [birth year], 82 y.o.   MRN: [id number]     Advanced Heart Failure Rounding Note  PCP: Dr Neta Mends  Primary Cardiologist: Dr Aundra Dubin  Subjective:    Sleepy this morning.  Denies dyspnea.  Weight stable.  Creatinine mildly elevated to 1.6 with INR still elevated at 1.96 this morning.   TEE: EF 55-60%, mildly dilated RV with normal systolic function, low gradient severe AS no AI, suspect severe eccentric MR from suspected small flail segment on anterior leaflet. Small ASD.    Objective:   Weight Range: 155 lb 1.6 oz (70.4 kg) Body mass index is 30.29 kg/m.   Vital Signs:   Temp:  [97.6 F (36.4 C)-99.3 F (37.4 C)] 98 F (36.7 C) (08/07 0513) Pulse Rate:  [40-115] 115 (08/07 0513) Resp:  [19-20] 20 (08/07 0513) BP: (106-129)/(68-86) 129/82 (08/07 0513) SpO2:  [97 %-100 %] 100 % (08/07 0513) Weight:  [155 lb 1.6 oz (70.4 kg)] 155 lb 1.6 oz (70.4 kg) (08/07 0513) Last BM Date: 05/13/17  Weight change: Filed Weights   05/11/17 0500 05/13/17 0439 05/14/17 0513  Weight: 161 lb 9.6 oz (73.3 kg) 155 lb 1.6 oz (70.4 kg) 155 lb 1.6 oz (70.4 kg)    Intake/Output:   Intake/Output Summary (Last 24 hours) at 05/14/17 0725 Last data filed at 05/14/17 0243  Gross per 24 hour  Intake              560 ml  Output             2000 ml  Net            -1440 ml      Physical Exam    General: Elderly female, NAD. Sitting in chair.  Neck: JVP 9-10 cm.  no thyromegaly or thyroid nodule.  Lungs: Clear in upper lobes, diminished in bases, but clear.  CV: Nondisplaced PMI. Irregularly irregular.  S1/S2, no S3/S4, 3/6 SEM RUSB. 1+ ankle edema. Abdomen: Soft, non tender, non distended. No hepatosplenomegaly. Skin: Intact without lesions or rashes.  Neurologic: Alert and oriented x 3.  Psych: Normal affect. Extremities: No clubbing or cyanosis. Warm.  HEENT: Normal.   Telemetry   Afib 90s, personally reviewed.   Labs    CBC  Recent  Labs  05/13/17 0704 05/14/17 0538  WBC 7.9 6.8  HGB 8.9* 9.1*  HCT 30.8* 30.3*  MCV 91.7 92.4  PLT 172 025   Basic Metabolic Panel  Recent Labs  05/13/17 0704 05/14/17 0538  NA 143 141  K 4.6 4.2  CL 97* 93*  CO2 37* 36*  GLUCOSE 194* 161*  BUN 31* 36*  CREATININE 1.41* 1.60*  CALCIUM 9.7 9.4   Liver Function Tests No results for input(s): AST, ALT, ALKPHOS, BILITOT, PROT, ALBUMIN in the last 72 hours.  BNP: BNP (last 3 results)  Recent Labs  11/02/16 1228 05/08/17 1247 05/08/17 1612  BNP 324.3* 685.7* 863.4*       Imaging   Transthoracic Echocardiography 05/03/17   Study Conclusions  - Left ventricle: The cavity size was normal. There was moderate   concentric hypertrophy. Systolic function was normal. The   estimated ejection fraction was in the range of 55% to 60%. Wall   motion was normal; there were no regional wall motion   abnormalities. The study was not technically sufficient to allow   evaluation of LV diastolic dysfunction due to atrial   fibrillation. - Aortic valve: There is moderate  to severe AS. The mean AV   gradient is 28mmHg but the calculated AVA is 0.73cm2. Visually   the valve appears moderate to severely stenotic. Valve mobility   was restricted. There was moderate to severe stenosis. There was   moderate to severe regurgitation. Mean gradient (S): 20 mm Hg.   VTI ratio of LVOT to aortic valve: 0.29. Valve area (VTI): 0.73   cm^2. Valve area (Vmax): 0.9 cm^2. Valve area (Vmean): 0.81 cm^2. - Mitral valve: Calcified annulus. There was mild to moderate   regurgitation directed eccentrically and posteriorly. Valve area   by continuity equation (using LVOT flow): 1.38 cm^2. - Left atrium: The atrium was massively dilated. - Right ventricle: The cavity size was mildly dilated. Wall   thickness was normal. - Right atrium: The atrium was massively dilated. - Atrial septum: There was a small secundum atrial septal defect.   There  was a left-to-right shunt through an atrial septal defect,   in the baseline state. - Tricuspid valve: There was moderate-severe regurgitation. - Pulmonary arteries: PA peak pressure: 64 mm Hg (S). - Pericardium, extracardiac: A trivial, free-flowing pericardial   effusion was identified along the left ventricular free wall.  Impressions:  - Compared to prior echo, the aortic insufficiency has worsened, TR   has worsened and pulmonary HTN has increased. Moderate to severe   AS is present. The right ventricular systolic pressure was   increased consistent with moderate pulmonary hypertension.    Medications:     Scheduled Medications: . atorvastatin  20 mg Oral q1800  . calcium-vitamin D  1 tablet Oral Q breakfast  . dorzolamide-timolol  1 drop Both Eyes BID  . ferrous sulfate  325 mg Oral Q breakfast  . glipiZIDE  10 mg Oral Q breakfast  . insulin aspart  0-15 Units Subcutaneous TID WC  . latanoprost  1 drop Both Eyes QHS  . magnesium oxide  400 mg Oral Daily  . metoprolol succinate  75 mg Oral BID  . multivitamin with minerals  1 tablet Oral Daily  . pantoprazole  40 mg Oral Daily  . potassium chloride  40 mEq Oral Daily  . tiotropium  18 mcg Inhalation Daily    Infusions: . sodium chloride    . ferumoxytol Stopped (05/10/17 1119)    PRN Medications: albuterol, alum & mag hydroxide-simeth, HYDROcodone-acetaminophen, ondansetron (ZOFRAN) IV, traZODone    Patient Profile   Destiny [name] is an 82 year old with history of HTN, DMII, chronic a fib, NIMC. Severe MR with failed MV clip, porcelain aorta, copd, anemia, GI bleed 2018 with non bleeding gastric ulcer, AKI, pulmonary htn, and diastolic heart failure admitted with volume overload. Recent Echo with worsening AS.   Assessment/Plan   1. Acute on chronic diastolic CHF: Echo (8/56) with EF 30-35%, diffuse hypokinesis, improved to 45% on 3/17 TEE and then to 60-65% on 3/18 echo. EF remains normal at 55-60% on echo  done in 7/18.  Nonischemic cardiomyopathy, no significant coronary disease on angiography. Acute decompensation may be related to her valvular disease with worsening AS and ongoing MR.  She was admitted 8/1 with hypotension, AKI and volume overload.  BP now stable.  Weight down 13 lbs. Creatinine mildly elevated to 1.6 today.   - Given plan for cath, will hold torsemide this morning and give very gentle IV hydration NS @ 75 cc/hr x 5 hrs. Hopefully RHC later today.  - With normal EF, no Entresto.  - Continue Toprol XL 75 mg bid  for rate control. 2. Atrial fibrillation: Chronic, HR better controlled on Toprol XL 75 mg bid.  - Warfarin on hold for cath. 3. Aortic stenosis: Severe AS on TEE 8/3, no AI.  Suspect this has worsened.  Likely that valvular heart disease has triggered her decompensation. Seen by Dr Angelena Form yesterday for TAVR evaluation.  Needs right/left heart cath (hopefully today, will repeat INR and BMET at 1 pm) => have discussed risks/benefits and patient agrees to proceed.  She will need CT scans for TAVR staged after that.  Probably home eventually and then return for TAVR.  4. Pulmonary hypertension: Mild to moderate pulmonary hypertension on RHC but PVR not significantly elevated, suspect pulmonary venous hypertension.  - No change.  5. Mitral regurgitation: Per Dr. Loni Muse - Suspected rupture of an anterior leaflet chord with very eccentric mitral regurgitation. MR was severe on 3/17 TEE.   Was seen for surgery eval for MV repair and AoV replacement.  However, given porcelain aorta, she was deemed not a candidate for open heart surgery. She was sent for percutaneous MV clipping.  This was attempted by Dr Bridgette Habermann at [facility] but procedure failed due to the pattern of MV calcification.  Subsequent TTEs were read as mild to moderate MR, but suspect they just did not visualize the MV effectively. TEE last week suggested ongoing severe MR with small flail segment of the anterior leaflet.   Unfortunately, there is now way to repair this.  May improve with correction of severe AS.  6. COPD: Moderate to severe by PFTs. - Continue Spiriva  7. Anemia: Upper GI bleed in 3/18, possibly from gastric ulcer.  She is back on warfarin.  Recent colonoscopy with no bleeding source. Stool is still dark, hemoglobin is stably low.  - hgb stable at 9.1.  8. Type II diabetes: - Continue SSI - Linagliptin stopped with CHF.  - No change.  9. AKI: Creatinine up in setting of low BP and volume overload.  Initially improved, a bit higher today.  Will give some gentle IV fluid pre-cath this morning (75 cc/hr x 5 hrs).  10. Insomnia: - Continue Trazodone.  11. Deconditioning: Appears to likely be in need of rehab facility after discharge but adamantly refuses.   Length of Stay: 6  Loralie Champagne, MD  05/14/2017, 7:25 AM  Advanced Heart Failure Team Pager [phone] (M-F; 7a - 4p)  Please contact [facility] for night-coverage after hours (4p -7a ) and weekends on [email or web address]

## 2017-05-14 NOTE — Progress Notes (Signed)
Report received in patient's room via [place] RN using SBAR format, reviewed VS, orders and patient's general condition, assumed care of patient.

## 2017-05-14 NOTE — Care Management Important Message (Signed)
Important Message  Patient Details  Name: Destiny [name] MRN: [id number] Date of Birth: 1935/08/29   Medicare Important Message Given:  Yes    Kamira Mellette Abena 05/14/2017, 9:45 AM

## 2017-05-14 NOTE — Progress Notes (Signed)
Respiratory Therapy called re: patient's inabilty to catch her breath. Patient has a bad Hx of CHF and is a little worried about the procedure, will continue to monior.

## 2017-05-14 NOTE — Progress Notes (Signed)
Pt had a 10 beat run of non-sustained v-tach. VS stable & charted. Pt asymptomatic. Cardiology notified. Will continue to monitor the pt. Hoover Brunette, RN

## 2017-05-14 NOTE — Progress Notes (Signed)
CCMD alerted that pt had a 20 beat of non-sustained VTACH. Pt asymptomatic, BP 96/57 (70). Pt denies any other cardiac-related symptoms, stating she "was just resting". Cardiology paged. Will continue to monitor closely.  Jacqlyn Larsen, RN

## 2017-05-15 ENCOUNTER — Encounter (HOSPITAL_COMMUNITY)
Admission: AD | Disposition: A | Discharge status: Home Health | Payer: Self-pay | Source: Ambulatory Visit | Attending: Cardiology

## 2017-05-15 ENCOUNTER — Inpatient Hospital Stay (HOSPITAL_COMMUNITY): Payer: Medicare HMO

## 2017-05-15 DIAGNOSIS — I350 Nonrheumatic aortic (valve) stenosis: Secondary | ICD-10-CM

## 2017-05-15 DIAGNOSIS — Z01810 Encounter for preprocedural cardiovascular examination: Secondary | ICD-10-CM

## 2017-05-15 HISTORY — PX: RIGHT/LEFT HEART CATH AND CORONARY ANGIOGRAPHY: CATH118266

## 2017-05-15 LAB — BASIC METABOLIC PANEL
ANION GAP: 6 (ref 5–15)
BUN: 37 mg/dL — ABNORMAL HIGH (ref 6–20)
CALCIUM: 9.2 mg/dL (ref 8.9–10.3)
CO2: 39 mmol/L — ABNORMAL HIGH (ref 22–32)
Chloride: 94 mmol/L — ABNORMAL LOW (ref 101–111)
Creatinine, Ser: 1.58 mg/dL — ABNORMAL HIGH (ref 0.44–1.00)
GFR, EST AFRICAN AMERICAN: 35 mL/min — AB (ref >60)
GFR, EST NON AFRICAN AMERICAN: 30 mL/min — AB (ref >60)
Glucose, Bld: 174 mg/dL — ABNORMAL HIGH (ref 65–99)
Potassium: 4 mmol/L (ref 3.5–5.1)
Sodium: 139 mmol/L (ref 135–145)

## 2017-05-15 LAB — VAS US CAROTID
LCCAPSYS: 92 cm/s
LEFT ECA DIAS: 8 cm/s
LEFT VERTEBRAL DIAS: -30 cm/s
LICADDIAS: -29 cm/s
LICAPDIAS: -19 cm/s
LICAPSYS: -71 cm/s
Left CCA dist dias: -15 cm/s
Left CCA dist sys: -81 cm/s
Left CCA prox dias: 19 cm/s
Left ICA dist sys: -96 cm/s
RIGHT ECA DIAS: 4 cm/s
RIGHT VERTEBRAL DIAS: 19 cm/s
Right CCA prox dias: 15 cm/s
Right CCA prox sys: 71 cm/s
Right cca dist sys: 57 cm/s

## 2017-05-15 LAB — CBC
HCT: 27.8 % — ABNORMAL LOW (ref 36.0–46.0)
Hemoglobin: 8.1 g/dL — ABNORMAL LOW (ref 12.0–15.0)
MCH: 27.4 pg (ref 26.0–34.0)
MCHC: 29.1 g/dL — AB (ref 30.0–36.0)
MCV: 93.9 fL (ref 78.0–100.0)
PLATELETS: 166 10*3/uL (ref 150–400)
RBC: 2.96 MIL/uL — AB (ref 3.87–5.11)
RDW: 15.9 % — ABNORMAL HIGH (ref 11.5–15.5)
WBC: 6.2 10*3/uL (ref 4.0–10.5)

## 2017-05-15 LAB — PROTIME-INR
INR: 2.06
INR: 1.67
PROTHROMBIN TIME: 23.6 s — AB (ref 11.4–15.2)
PROTHROMBIN TIME: 19.9 s — AB (ref 11.4–15.2)

## 2017-05-15 LAB — GLUCOSE, CAPILLARY
GLUCOSE-CAPILLARY: 183 mg/dL — AB (ref 65–99)
GLUCOSE-CAPILLARY: 193 mg/dL — AB (ref 65–99)
GLUCOSE-CAPILLARY: 206 mg/dL — AB (ref 65–99)
Glucose-Capillary: 112 mg/dL — ABNORMAL HIGH (ref 65–99)
Glucose-Capillary: 240 mg/dL — ABNORMAL HIGH (ref 65–99)

## 2017-05-15 SURGERY — RIGHT/LEFT HEART CATH AND CORONARY ANGIOGRAPHY
Anesthesia: LOCAL

## 2017-05-15 MED ORDER — LIDOCAINE HCL 1 % IJ SOLN
INTRAMUSCULAR | Status: AC
  Filled 2017-05-15: qty 20

## 2017-05-15 MED ORDER — WARFARIN SODIUM 7.5 MG PO TABS
7.5000 mg | ORAL_TABLET | Freq: Once | ORAL | Status: AC
  Administered 2017-05-15: 7.5 mg via ORAL
  Filled 2017-05-15: qty 1

## 2017-05-15 MED ORDER — ACETAMINOPHEN 325 MG PO TABS: 650.0000 mg | ORAL_TABLET | ORAL | Status: DC | PRN

## 2017-05-15 MED ORDER — WARFARIN - PHARMACIST DOSING INPATIENT: Freq: Every day | Status: DC

## 2017-05-15 MED ORDER — FUROSEMIDE 10 MG/ML IJ SOLN
80.0000 mg | Freq: Two times a day (BID) | INTRAMUSCULAR | Status: DC
  Administered 2017-05-15 – 2017-05-16 (×3): 80 mg via INTRAVENOUS
  Filled 2017-05-15 (×4): qty 8

## 2017-05-15 MED ORDER — HEPARIN (PORCINE) IN NACL 2-0.9 UNIT/ML-% IJ SOLN
INTRAMUSCULAR | Status: AC
  Filled 2017-05-15: qty 1000

## 2017-05-15 MED ORDER — FENTANYL CITRATE (PF) 100 MCG/2ML IJ SOLN
INTRAMUSCULAR | Status: DC | PRN
  Administered 2017-05-15: 25 ug via INTRAVENOUS

## 2017-05-15 MED ORDER — ONDANSETRON HCL 4 MG/2ML IJ SOLN: 4.0000 mg | Freq: Four times a day (QID) | INTRAMUSCULAR | Status: DC | PRN

## 2017-05-15 MED ORDER — WARFARIN SODIUM 7.5 MG PO TABS: 7.5000 mg | ORAL_TABLET | Freq: Once | ORAL | Status: DC

## 2017-05-15 MED ORDER — IOPAMIDOL (ISOVUE-370) INJECTION 76%
INTRAVENOUS | Status: DC | PRN
Start: 2017-05-15 — End: 2017-05-15
  Administered 2017-05-15: 70 mL via INTRAVENOUS

## 2017-05-15 MED ORDER — FENTANYL CITRATE (PF) 100 MCG/2ML IJ SOLN
INTRAMUSCULAR | Status: AC
  Filled 2017-05-15: qty 2

## 2017-05-15 MED ORDER — HEPARIN (PORCINE) IN NACL 2-0.9 UNIT/ML-% IJ SOLN
INTRAMUSCULAR | Status: AC | PRN
  Administered 2017-05-15: 1000 mL via INTRA_ARTERIAL

## 2017-05-15 MED ORDER — LIDOCAINE HCL (PF) 1 % IJ SOLN
INTRAMUSCULAR | Status: DC | PRN
  Administered 2017-05-15: 20 mL
  Administered 2017-05-15: 2 mL

## 2017-05-15 MED ORDER — SODIUM CHLORIDE 0.9% FLUSH: 3.0000 mL | INTRAVENOUS | Status: DC | PRN

## 2017-05-15 MED ORDER — SODIUM CHLORIDE 0.9 % IV SOLN: 250.0000 mL | INTRAVENOUS | Status: DC | PRN

## 2017-05-15 MED ORDER — SODIUM CHLORIDE 0.9% FLUSH
3.0000 mL | Freq: Two times a day (BID) | INTRAVENOUS | Status: DC
  Administered 2017-05-15 – 2017-05-17 (×4): 3 mL via INTRAVENOUS

## 2017-05-15 MED ORDER — PHYTONADIONE 1 MG/0.5 ML ORAL SOLUTION
2.5000 mg | Freq: Once | ORAL | Status: AC
  Administered 2017-05-15: 2.6 mg via ORAL
  Filled 2017-05-15: qty 1.5

## 2017-05-15 SURGICAL SUPPLY — 13 items
CATH BALLN WEDGE 5F 110CM (CATHETERS) ×2 IMPLANT
CATH INFINITI 5 FR 3DRC (CATHETERS) ×2 IMPLANT
CATH INFINITI 5FR MULTPACK ANG (CATHETERS) ×2 IMPLANT
ELECT DEFIB PAD ADLT CADENCE (PAD) ×2 IMPLANT
GUIDEWIRE .025 260CM (WIRE) ×2 IMPLANT
KIT HEART LEFT (KITS) ×2 IMPLANT
KIT HEART RIGHT NAMIC (KITS) ×2 IMPLANT
PACK CARDIAC CATHETERIZATION (CUSTOM PROCEDURE TRAY) ×2 IMPLANT
SHEATH GLIDE SLENDER 4/5FR (SHEATH) ×2 IMPLANT
SHEATH PINNACLE 5F 10CM (SHEATH) ×2 IMPLANT
SHEATH PINNACLE 7F 10CM (SHEATH) IMPLANT
TRANSDUCER W/STOPCOCK (MISCELLANEOUS) ×2 IMPLANT
WIRE EMERALD 3MM-J .035X150CM (WIRE) ×2 IMPLANT

## 2017-05-15 NOTE — Plan of Care (Signed)
Problem: Cardiac: Goal: Ability to achieve and maintain adequate cardiopulmonary perfusion will improve Outcome: Not Progressing Pt had a 20 beat run of VTACH; non-sustained. Pt asymptomatic. VSS.   Problem: Cardiovascular: Goal: Ability to achieve and maintain adequate cardiovascular perfusion will improve Outcome: Progressing Cath planned for 8/8 pending AM lab work.

## 2017-05-15 NOTE — Progress Notes (Signed)
**  Preliminary report by tech**  Carotid artery duplex complete. Findings are consistent with a 1-39 percent stenosis involving the right internal carotid artery and the left internal carotid artery. The vertebral arteries demonstrate antegrade flow.  05/15/17 2:22 PM Destiny [name] RVT

## 2017-05-15 NOTE — Progress Notes (Signed)
TAVR Pre Screen   6 Minute Walk Test  Distance - 221 ft      Pre    Post  BP     102/68             110/69  HR    87    90  SaO2    99%    84%  Modified Borg Dyspnea 0    2  RPE    0    2    5 Meter Walk Test  Trial   1) 34 sec   2) 21 sec  3) 34 sec  Average - 29.67 sec = 0.55 ft/sec   Clinical Frailty Scale - [place], PT  Acute Rehab Services  [phone]

## 2017-05-15 NOTE — Progress Notes (Signed)
ANTICOAGULATION CONSULT NOTE - Follow Up Consult  Pharmacy Consult for Coumadin Indication: atrial fibrillation  No Known Allergies  Patient Measurements: Height: 5' (152.4 cm) Weight: 153 lb 14.1 oz (69.8 kg) IBW/kg (Calculated) : 45.5  Vital Signs: Temp: 97.9 F (36.6 C) (08/08 1145) Temp Source: Oral (08/08 1145) BP: 113/64 (08/08 1711) Pulse Rate: 268 (08/08 1748)  Labs:  Recent Labs  05/13/17 0704 05/14/17 0538 05/14/17 1237 05/15/17 0240 05/15/17 0758 05/15/17 1211  HGB 8.9* 9.1*  --   --  8.1*  --   HCT 30.8* 30.3*  --   --  27.8*  --   PLT 172 179  --   --  166  --   LABPROT 23.5* 22.7* 24.2* 23.6*  --  19.9*  INR 2.05 1.96 2.13 2.06  --  1.67  CREATININE 1.41* 1.60* 1.50* 1.58*  --   --     Estimated Creatinine Clearance: 24.7 mL/min (A) (by C-G formula based on SCr of 1.58 mg/dL (H)).  Assessment: 82yof on Coumadin PTA for afib. INR 1.29 on admit. She has been off of her coumadin for a colonoscopy that was done last Wednesday 7/25 and she resumed taking it 7/31. Coumadin was then held for cath on 8/8 and pharmacy to resume today. She is noted s/p 2.5 mg po vitamin K on 8/8. INR with slow trend up when coumadin was started previously.  -INR= 1.67  PTA dose: 5mg  daily except 2.5mg  Mon/Fri  Goal of Therapy:  INR 2-3 Monitor platelets by anticoagulation protocol: Yes   Plan:  -Coumadin 7.5 mg po today -Daily PT/INR  Hildred Laser, Pharm D 05/15/2017 6:17 PM

## 2017-05-15 NOTE — Progress Notes (Signed)
Patient ID: Destiny [name], female   DOB: Aug 03, 1935, 82 y.o.   MRN: [id number]     Advanced Heart Failure Rounding Note  PCP: Dr Neta Mends  Primary Cardiologist: Dr Aundra Dubin  Subjective:    Sleepy this morning.  Denies dyspnea.  Weight down 2 lbs.  INR remains > 2 despite a number of days off warfarin.  Creatinine stable at 1.58 today.  She feels like breathing is better.  She is weak, not getting out of bed much.  TEE: EF 55-60%, mildly dilated RV with normal systolic function, low gradient severe AS no AI, suspect severe eccentric MR from suspected small flail segment on anterior leaflet. Small ASD.    Objective:   Weight Range: 153 lb 14.1 oz (69.8 kg) Body mass index is 30.05 kg/m.   Vital Signs:   Temp:  [97.7 F (36.5 C)-99 F (37.2 C)] 98.8 F (37.1 C) (08/08 0450) Pulse Rate:  [52-107] 90 (08/08 0450) Resp:  [14-16] 15 (08/08 0450) BP: (96-134)/(57-87) 115/87 (08/08 0450) SpO2:  [88 %-100 %] 100 % (08/08 0450) Weight:  [153 lb 14.1 oz (69.8 kg)] 153 lb 14.1 oz (69.8 kg) (08/08 0450) Last BM Date: 05/13/17  Weight change: Filed Weights   05/13/17 0439 05/14/17 0513 05/15/17 0450  Weight: 155 lb 1.6 oz (70.4 kg) 155 lb 1.6 oz (70.4 kg) 153 lb 14.1 oz (69.8 kg)    Intake/Output:   Intake/Output Summary (Last 24 hours) at 05/15/17 0723 Last data filed at 05/15/17 0455  Gross per 24 hour  Intake              840 ml  Output             1800 ml  Net             -960 ml      Physical Exam    General: NAD Neck: JVP 8-9 cm, no thyromegaly or thyroid nodule.  Lungs: Clear to auscultation bilaterally with normal respiratory effort. CV: Nondisplaced PMI.  Heart irregular S1/S2, no S3/S4, no murmur.  No peripheral edema.   Abdomen: Soft, nontender, no hepatosplenomegaly, no distention.  Skin: Intact without lesions or rashes.  Neurologic: Alert and oriented x 3.  Psych: Normal affect. Extremities: No clubbing or cyanosis.  HEENT: Normal.    Telemetry    Afib 90s, personally reviewed  Labs    CBC  Recent Labs  05/13/17 0704 05/14/17 0538  WBC 7.9 6.8  HGB 8.9* 9.1*  HCT 30.8* 30.3*  MCV 91.7 92.4  PLT 172 017   Basic Metabolic Panel  Recent Labs  05/14/17 1237 05/15/17 0240  NA 141 139  K 4.5 4.0  CL 95* 94*  CO2 37* 39*  GLUCOSE 152* 174*  BUN 34* 37*  CREATININE 1.50* 1.58*  CALCIUM 9.5 9.2   Liver Function Tests No results for input(s): AST, ALT, ALKPHOS, BILITOT, PROT, ALBUMIN in the last 72 hours.  BNP: BNP (last 3 results)  Recent Labs  11/02/16 1228 05/08/17 1247 05/08/17 1612  BNP 324.3* 685.7* 863.4*       Imaging   Transthoracic Echocardiography 05/03/17   Study Conclusions  - Left ventricle: The cavity size was normal. There was moderate   concentric hypertrophy. Systolic function was normal. The   estimated ejection fraction was in the range of 55% to 60%. Wall   motion was normal; there were no regional wall motion   abnormalities. The study was not technically sufficient to allow   evaluation of  LV diastolic dysfunction due to atrial   fibrillation. - Aortic valve: There is moderate to severe AS. The mean AV   gradient is 46mmHg but the calculated AVA is 0.73cm2. Visually   the valve appears moderate to severely stenotic. Valve mobility   was restricted. There was moderate to severe stenosis. There was   moderate to severe regurgitation. Mean gradient (S): 20 mm Hg.   VTI ratio of LVOT to aortic valve: 0.29. Valve area (VTI): 0.73   cm^2. Valve area (Vmax): 0.9 cm^2. Valve area (Vmean): 0.81 cm^2. - Mitral valve: Calcified annulus. There was mild to moderate   regurgitation directed eccentrically and posteriorly. Valve area   by continuity equation (using LVOT flow): 1.38 cm^2. - Left atrium: The atrium was massively dilated. - Right ventricle: The cavity size was mildly dilated. Wall   thickness was normal. - Right atrium: The atrium was massively dilated. - Atrial septum:  There was a small secundum atrial septal defect.   There was a left-to-right shunt through an atrial septal defect,   in the baseline state. - Tricuspid valve: There was moderate-severe regurgitation. - Pulmonary arteries: PA peak pressure: 64 mm Hg (S). - Pericardium, extracardiac: A trivial, free-flowing pericardial   effusion was identified along the left ventricular free wall.  Impressions:  - Compared to prior echo, the aortic insufficiency has worsened, TR   has worsened and pulmonary HTN has increased. Moderate to severe   AS is present. The right ventricular systolic pressure was   increased consistent with moderate pulmonary hypertension.    Medications:     Scheduled Medications: . atorvastatin  20 mg Oral q1800  . calcium-vitamin D  1 tablet Oral Q breakfast  . dorzolamide-timolol  1 drop Both Eyes BID  . ferrous sulfate  325 mg Oral Q breakfast  . glipiZIDE  10 mg Oral Q breakfast  . insulin aspart  0-15 Units Subcutaneous TID WC  . latanoprost  1 drop Both Eyes QHS  . magnesium oxide  400 mg Oral Daily  . metoprolol succinate  75 mg Oral BID  . multivitamin with minerals  1 tablet Oral Daily  . pantoprazole  40 mg Oral Daily  . phytonadione  2.6 mg Oral Once  . potassium chloride  40 mEq Oral Daily  . tiotropium  18 mcg Inhalation Daily    Infusions: . ferumoxytol Stopped (05/10/17 1119)    PRN Medications: albuterol, alum & mag hydroxide-simeth, HYDROcodone-acetaminophen, ondansetron (ZOFRAN) IV, traZODone    Patient Profile   Destiny [name] is an 82 year old with history of HTN, DMII, chronic a fib, NIMC. Severe MR with failed MV clip, porcelain aorta, copd, anemia, GI bleed 2018 with non bleeding gastric ulcer, AKI, pulmonary htn, and diastolic heart failure admitted with volume overload. Recent Echo with worsening AS.   Assessment/Plan   1. Acute on chronic diastolic CHF: Echo (1/61) with EF 30-35%, diffuse hypokinesis, improved to 45% on 3/17 TEE  and then to 60-65% on 3/18 echo. EF remains normal at 55-60% on echo done in 7/18.  Nonischemic cardiomyopathy, no significant coronary disease on angiography. Acute decompensation may be related to her valvular disease with worsening AS and ongoing MR.  She was admitted 8/1 with hypotension, AKI and volume overload.  BP now stable.  Weight down a couple more lbs. Creatinine stable at 1.58.   - Given plan for cath, will hold torsemide this morning (got last night). - With normal EF, no Entresto.  - Continue Toprol XL 75  mg bid for rate control. 2. Atrial fibrillation: Chronic, HR better controlled on Toprol XL 75 mg bid.  - Warfarin on hold for cath. 3. Aortic stenosis: Severe AS on TEE 8/3, no AI.  Suspect this has worsened.  Likely that valvular heart disease has triggered her decompensation. Seen by Dr Angelena Form yesterday for TAVR evaluation.  Needs right/left heart cath (hopefully today, will give 2.5 mg vitamin K as she has been right at 2 for several days now and repeat INR at 1 pm) => have discussed risks/benefits and patient agrees to proceed.  She will need CT scans for TAVR staged after that.  Probably home eventually and then return for TAVR.  4. Pulmonary hypertension: Mild to moderate pulmonary hypertension on RHC but PVR not significantly elevated, suspect pulmonary venous hypertension.  - No change.  5. Mitral regurgitation: Per Dr. Loni Muse - Suspected rupture of an anterior leaflet chord with very eccentric mitral regurgitation. MR was severe on 3/17 TEE.   Was seen for surgery eval for MV repair and AoV replacement.  However, given porcelain aorta, she was deemed not a candidate for open heart surgery. She was sent for percutaneous MV clipping.  This was attempted by Dr Bridgette Habermann at [facility] but procedure failed due to the pattern of MV calcification.  Subsequent TTEs were read as mild to moderate MR, but suspect they just did not visualize the MV effectively. TEE last week suggested ongoing severe  MR with small flail segment of the anterior leaflet.  Unfortunately, there is now way to repair this.  May improve with correction of severe AS.  6. COPD: Moderate to severe by PFTs. - Continue Spiriva  7. Anemia: Upper GI bleed in 3/18, possibly from gastric ulcer.  She is back on warfarin.  Recent colonoscopy with no bleeding source. Stool is still dark, hemoglobin is stably low.  - hgb stable yesterday, need to send today.  8. Type II diabetes: - Continue SSI - Linagliptin stopped with CHF.  - No change.  9. AKI: Creatinine up in setting of low BP and volume overload.  Creatinine stable today at 1.58. 10. Insomnia: - Continue Trazodone.  11. Deconditioning: Appears to likely be in need of rehab facility after discharge but adamantly refuses.   Length of Stay: 7  Loralie Champagne, MD  05/15/2017, 7:23 AM  Advanced Heart Failure Team Pager [phone] (M-F; 7a - 4p)  Please contact [facility] for night-coverage after hours (4p -7a ) and weekends on [email or web address]

## 2017-05-15 NOTE — Progress Notes (Signed)
Physical Therapy Treatment Patient Details Name: Destiny [name] MRN: [id number] DOB: October 08, 1935 Today's Date: 05/15/2017    History of Present Illness Destiny [name] is an 82 year old with history of HTN, DMII, chronic a fib, NIMC. Severe MR with failed MV clip, porcelain aorta, copd, anemia, GI bleed 2018 with non bleeding gastric ulcer, AKI, pulmonary htn, and diastolic heart failure admitted with volume overload. Recent Echo with worsening AS. TEE performed, plan for [facility] on 05/13/17 and possible TAVR candidate.    PT Comments    Pre TAVR screen performed during session today, see follow up note for results. Pt ambulated >200 with RW and min-guard A. Pt remained on 4L O2 with O2 sats as low as mid 80's but 97% at rest within 1 min sitting. PT will continue to follow.    Follow Up Recommendations  Home health PT     Equipment Recommendations  None recommended by PT    Recommendations for Other Services       Precautions / Restrictions Precautions Precautions: Fall Precaution Comments: watch O2 sats Restrictions Weight Bearing Restrictions: No    Mobility  Bed Mobility Overal bed mobility: Modified Independent             General bed mobility comments: pt able to get to EOB with increased time but no physical assist  Transfers Overall transfer level: Needs assistance Equipment used: Rolling walker (2 wheeled) Transfers: Sit to/from Stand Sit to Stand: Min assist         General transfer comment: pt continues to have trouble with sit to stand, needed min A. Pt planning to get a lift chair when she gets home which would be hugely helpful for her  Ambulation/Gait Ambulation/Gait assistance: Min guard Ambulation Distance (Feet): 550 Feet Assistive device: Rolling walker (2 wheeled) Gait Pattern/deviations: Trunk flexed;Step-through pattern;Shuffle;Decreased step length - right;Decreased step length - left Gait velocity: decreased Gait velocity interpretation:  <1.8 ft/sec, indicative of risk for recurrent falls General Gait Details: pt on 4L O2 throughout, fatigued but able to perform 6 min walk test without needing to sit and rest   Stairs            Wheelchair Mobility    Modified Rankin (Stroke Patients Only)       Balance Overall balance assessment: Needs assistance Sitting-balance support: No upper extremity supported Sitting balance-Leahy Scale: Good     Standing balance support: No upper extremity supported Standing balance-Leahy Scale: Fair Standing balance comment: static standing with supervision                            Cognition Arousal/Alertness: Awake/alert Behavior During Therapy: Flat affect Overall Cognitive Status: Within Functional Limits for tasks assessed                                        Exercises      General Comments        Pertinent Vitals/Pain Pain Assessment: No/denies pain    Home Living                      Prior Function            PT Goals (current goals can now be found in the care plan section) Acute Rehab PT Goals Patient Stated Goal: return home PT Goal Formulation: With patient Time  For Goal Achievement: 05/25/17 Potential to Achieve Goals: Fair Progress towards PT goals: Progressing toward goals    Frequency    Min 3X/week      PT Plan Current plan remains appropriate    Co-evaluation              AM-PAC PT "6 Clicks" Daily Activity  Outcome Measure  Difficulty turning over in bed (including adjusting bedclothes, sheets and blankets)?: A Little Difficulty moving from lying on back to sitting on the side of the bed? : A Little Difficulty sitting down on and standing up from a chair with arms (e.g., wheelchair, bedside commode, etc,.)?: Total Help needed moving to and from a bed to chair (including a wheelchair)?: A Little Help needed walking in hospital room?: A Little Help needed climbing 3-5 steps with a  railing? : A Lot 6 Click Score: 15    End of Session Equipment Utilized During Treatment: Gait belt;Oxygen Activity Tolerance: Patient limited by fatigue Patient left: in chair;with call bell/phone within reach;with family/visitor present Nurse Communication: Mobility status PT Visit Diagnosis: Unsteadiness on feet (R26.81);Muscle weakness (generalized) (M62.81);Difficulty in walking, not elsewhere classified (R26.2)     Time: 0051-1021 PT Time Calculation (min) (ACUTE ONLY): 43 min  Charges:  $Gait Training: 23-37 mins $Therapeutic Activity: 8-22 mins                    G Codes:       Destiny [name], PT  Acute Rehab Services  Leslie 05/15/2017, 1:29 PM

## 2017-05-16 ENCOUNTER — Encounter (HOSPITAL_COMMUNITY): Payer: Self-pay | Admitting: Cardiology

## 2017-05-16 ENCOUNTER — Other Ambulatory Visit: Payer: Self-pay | Admitting: *Deleted

## 2017-05-16 DIAGNOSIS — I350 Nonrheumatic aortic (valve) stenosis: Secondary | ICD-10-CM

## 2017-05-16 LAB — BASIC METABOLIC PANEL
Anion gap: 8 (ref 5–15)
BUN: 33 mg/dL — AB (ref 6–20)
CALCIUM: 9.4 mg/dL (ref 8.9–10.3)
CHLORIDE: 96 mmol/L — AB (ref 101–111)
CO2: 37 mmol/L — AB (ref 22–32)
CREATININE: 1.28 mg/dL — AB (ref 0.44–1.00)
GFR, EST AFRICAN AMERICAN: 45 mL/min — AB (ref >60)
GFR, EST NON AFRICAN AMERICAN: 38 mL/min — AB (ref >60)
GLUCOSE: 198 mg/dL — AB (ref 65–99)
POTASSIUM: 4 mmol/L (ref 3.5–5.1)
Sodium: 141 mmol/L (ref 135–145)

## 2017-05-16 LAB — CBC
HEMATOCRIT: 28.5 % — AB (ref 36.0–46.0)
Hemoglobin: 8.4 g/dL — ABNORMAL LOW (ref 12.0–15.0)
MCH: 27.3 pg (ref 26.0–34.0)
MCHC: 29.5 g/dL — AB (ref 30.0–36.0)
MCV: 92.5 fL (ref 78.0–100.0)
Platelets: 182 10*3/uL (ref 150–400)
RBC: 3.08 MIL/uL — ABNORMAL LOW (ref 3.87–5.11)
RDW: 15.9 % — AB (ref 11.5–15.5)
WBC: 5.7 10*3/uL (ref 4.0–10.5)

## 2017-05-16 LAB — POCT I-STAT 3, VENOUS BLOOD GAS (G3P V)
ACID-BASE EXCESS: 10 mmol/L — AB (ref 0.0–2.0)
ACID-BASE EXCESS: 14 mmol/L — AB (ref 0.0–2.0)
Bicarbonate: 34.4 mmol/L — ABNORMAL HIGH (ref 20.0–28.0)
Bicarbonate: 40.4 mmol/L — ABNORMAL HIGH (ref 20.0–28.0)
O2 Saturation: 64 %
O2 Saturation: 84 %
PH VEN: 7.448 — AB (ref 7.250–7.430)
PO2 VEN: 46 mmHg — AB (ref 32.0–45.0)
TCO2: 36 mmol/L (ref 0–100)
TCO2: 42 mmol/L (ref 0–100)
pCO2, Ven: 48 mmHg (ref 44.0–60.0)
pCO2, Ven: 58.5 mmHg (ref 44.0–60.0)
pH, Ven: 7.464 — ABNORMAL HIGH (ref 7.250–7.430)
pO2, Ven: 33 mmHg (ref 32.0–45.0)

## 2017-05-16 LAB — PROTIME-INR
INR: 1.48
Prothrombin Time: 18 seconds — ABNORMAL HIGH (ref 11.4–15.2)

## 2017-05-16 LAB — POCT ACTIVATED CLOTTING TIME: ACTIVATED CLOTTING TIME: 158 s

## 2017-05-16 LAB — GLUCOSE, CAPILLARY
GLUCOSE-CAPILLARY: 191 mg/dL — AB (ref 65–99)
GLUCOSE-CAPILLARY: 166 mg/dL — AB (ref 65–99)
Glucose-Capillary: 175 mg/dL — ABNORMAL HIGH (ref 65–99)
Glucose-Capillary: 130 mg/dL — ABNORMAL HIGH (ref 65–99)

## 2017-05-16 MED ORDER — TRAZODONE HCL 50 MG PO TABS: 25.0000 mg | ORAL_TABLET | Freq: Every evening | ORAL | Status: DC | PRN

## 2017-05-16 MED ORDER — METOLAZONE 5 MG PO TABS
2.5000 mg | ORAL_TABLET | Freq: Once | ORAL | Status: AC
  Administered 2017-05-16: 2.5 mg via ORAL
  Filled 2017-05-16: qty 1

## 2017-05-16 MED ORDER — WARFARIN SODIUM 7.5 MG PO TABS
7.5000 mg | ORAL_TABLET | Freq: Once | ORAL | Status: AC
  Administered 2017-05-16: 7.5 mg via ORAL
  Filled 2017-05-16: qty 1

## 2017-05-16 MED ORDER — POLYVINYL ALCOHOL 1.4 % OP SOLN
1.0000 [drp] | OPHTHALMIC | Status: DC | PRN
  Administered 2017-05-17: 1 [drp] via OPHTHALMIC
  Filled 2017-05-16: qty 15

## 2017-05-16 NOTE — Progress Notes (Signed)
Patient ID: Destiny [name], female   DOB: 1935-08-12, 82 y.o.   MRN: [id number]     Advanced Heart Failure Rounding Note  PCP: Dr Neta Mends  Primary Cardiologist: Dr Aundra Dubin  Subjective:    Lethargic this am, she did walk 6 minutes yesterday. Felt SOB last night with going from bed to chair.   Right and left heart cath yesterday. With no CAD and mildly elevated filling pressures (PCWP 20)   TEE: EF 55-60%, mildly dilated RV with normal systolic function, low gradient severe AS no AI, suspect severe eccentric MR from suspected small flail segment on anterior leaflet. Small ASD.    Objective:   Weight Range: 155 lb 4.8 oz (70.4 kg) Body mass index is 30.33 kg/m.   Vital Signs:   Temp:  [97.9 F (36.6 C)-99 F (37.2 C)] 98 F (36.7 C) (08/09 0501) Pulse Rate:  [0-288] 87 (08/09 0501) Resp:  [0-37] 14 (08/09 0501) BP: (101-127)/(55-83) 114/76 (08/09 0501) SpO2:  [0 %-100 %] 95 % (08/09 0501) Weight:  [155 lb 4.8 oz (70.4 kg)] 155 lb 4.8 oz (70.4 kg) (08/09 0459) Last BM Date: 05/13/17  Weight change: Filed Weights   05/14/17 0513 05/15/17 0450 05/16/17 0459  Weight: 155 lb 1.6 oz (70.4 kg) 153 lb 14.1 oz (69.8 kg) 155 lb 4.8 oz (70.4 kg)    Intake/Output:   Intake/Output Summary (Last 24 hours) at 05/16/17 0725 Last data filed at 05/16/17 0502  Gross per 24 hour  Intake                0 ml  Output             1050 ml  Net            -1050 ml      Physical Exam    General: Fatigued appearing elderly female. No resp difficulty. HEENT: Normal Neck: Supple. JVP to jaw. Carotids 2+ bilat; + bruits. No thyromegaly or nodule noted. Cor: PMI nondisplaced. RRR, 3/6 AS murmur. 2/6 MR Lungs: CTAB, normal effort. Abdomen: Soft, non-tender, non-distended, no HSM. No bruits or masses. +BS  Extremities: No cyanosis, clubbing, rash, Trace pedal edema.  Neuro: Alert & orientedx3, cranial nerves grossly intact. moves all 4 extremities w/o difficulty. Affect  pleasant    Telemetry   Afib 90's. - personally reviewed.   Labs    CBC  Recent Labs  05/15/17 0758 05/16/17 0330  WBC 6.2 5.7  HGB 8.1* 8.4*  HCT 27.8* 28.5*  MCV 93.9 92.5  PLT 166 419   Basic Metabolic Panel  Recent Labs  05/15/17 0240 05/16/17 0330  NA 139 141  K 4.0 4.0  CL 94* 96*  CO2 39* 37*  GLUCOSE 174* 198*  BUN 37* 33*  CREATININE 1.58* 1.28*  CALCIUM 9.2 9.4   Liver Function Tests No results for input(s): AST, ALT, ALKPHOS, BILITOT, PROT, ALBUMIN in the last 72 hours.  BNP: BNP (last 3 results)  Recent Labs  11/02/16 1228 05/08/17 1247 05/08/17 1612  BNP 324.3* 685.7* 863.4*       Imaging   Transthoracic Echocardiography 05/03/17   Study Conclusions  - Left ventricle: The cavity size was normal. There was moderate   concentric hypertrophy. Systolic function was normal. The   estimated ejection fraction was in the range of 55% to 60%. Wall   motion was normal; there were no regional wall motion   abnormalities. The study was not technically sufficient to allow   evaluation of LV  diastolic dysfunction due to atrial   fibrillation. - Aortic valve: There is moderate to severe AS. The mean AV   gradient is 33mmHg but the calculated AVA is 0.73cm2. Visually   the valve appears moderate to severely stenotic. Valve mobility   was restricted. There was moderate to severe stenosis. There was   moderate to severe regurgitation. Mean gradient (S): 20 mm Hg.   VTI ratio of LVOT to aortic valve: 0.29. Valve area (VTI): 0.73   cm^2. Valve area (Vmax): 0.9 cm^2. Valve area (Vmean): 0.81 cm^2. - Mitral valve: Calcified annulus. There was mild to moderate   regurgitation directed eccentrically and posteriorly. Valve area   by continuity equation (using LVOT flow): 1.38 cm^2. - Left atrium: The atrium was massively dilated. - Right ventricle: The cavity size was mildly dilated. Wall   thickness was normal. - Right atrium: The atrium was  massively dilated. - Atrial septum: There was a small secundum atrial septal defect.   There was a left-to-right shunt through an atrial septal defect,   in the baseline state. - Tricuspid valve: There was moderate-severe regurgitation. - Pulmonary arteries: PA peak pressure: 64 mm Hg (S). - Pericardium, extracardiac: A trivial, free-flowing pericardial   effusion was identified along the left ventricular free wall.  Impressions:  - Compared to prior echo, the aortic insufficiency has worsened, TR   has worsened and pulmonary HTN has increased. Moderate to severe   AS is present. The right ventricular systolic pressure was   increased consistent with moderate pulmonary hypertension.    Medications:     Scheduled Medications: . atorvastatin  20 mg Oral q1800  . calcium-vitamin D  1 tablet Oral Q breakfast  . dorzolamide-timolol  1 drop Both Eyes BID  . ferrous sulfate  325 mg Oral Q breakfast  . furosemide  80 mg Intravenous BID  . glipiZIDE  10 mg Oral Q breakfast  . insulin aspart  0-15 Units Subcutaneous TID WC  . latanoprost  1 drop Both Eyes QHS  . magnesium oxide  400 mg Oral Daily  . metoprolol succinate  75 mg Oral BID  . multivitamin with minerals  1 tablet Oral Daily  . pantoprazole  40 mg Oral Daily  . potassium chloride  40 mEq Oral Daily  . sodium chloride flush  3 mL Intravenous Q12H  . tiotropium  18 mcg Inhalation Daily  . Warfarin - Pharmacist Dosing Inpatient   Does not apply q1800    Infusions: . sodium chloride    . ferumoxytol Stopped (05/10/17 1119)    PRN Medications: sodium chloride, acetaminophen, albuterol, alum & mag hydroxide-simeth, HYDROcodone-acetaminophen, ondansetron (ZOFRAN) IV, sodium chloride flush, traZODone    Patient Profile   Destiny [name] is an 82 year old with history of HTN, DMII, chronic a fib, NIMC. Severe MR with failed MV clip, porcelain aorta, copd, anemia, GI bleed 2018 with non bleeding gastric ulcer, AKI, pulmonary  htn, and diastolic heart failure admitted with volume overload. Recent Echo with worsening AS.   Assessment/Plan   1. Acute on chronic diastolic CHF: Echo (0/62) with EF 30-35%, diffuse hypokinesis, improved to 45% on 3/17 TEE and then to 60-65% on 3/18 echo. EF remains normal at 55-60% on echo done in 7/18.  Nonischemic cardiomyopathy, no significant coronary disease on angiography. Acute decompensation may be related to her valvular disease with worsening AS and ongoing MR.  She was admitted 8/1 with hypotension, AKI and volume overload.  BP now stable.   - right  heart cath yesterday with RA pressure 17. IV Lasix restarted last night, urine output sluggish. Did well with metolazone earlier this admit. Will add metolazone 2.5 mg today.  - With normal EF, no Entresto.  - Continue Toprol XL 75 mg bid for rate control.  2. Atrial fibrillation: Chronic, HR better controlled on Toprol XL 75 mg bid.  - Warfarin restarted. Got 2.5 of vitamin K yesterday prior to cath. INR 1.48 this am.   3. Aortic stenosis: Severe AS on TEE 8/3, no AI.  Suspect this has worsened.  Likely that valvular heart disease has triggered her decompensation. Seen by Dr Angelena Form for TAVR evaluation.   - Discussed with Dr. Angelena Form, and spoke to [facility] surgery coordinator Thurmond Butts, will order CT scans (creatinine 1.28).  - Dr. Cyndia Bent to see today.   4. Pulmonary hypertension: Mild to moderate pulmonary hypertension on RHC but PVR not significantly elevated, suspect pulmonary venous hypertension.  - No change.   5. Mitral regurgitation: Per Dr. Aundra Dubin.  - Suspected rupture of an anterior leaflet chord with very eccentric mitral regurgitation. MR was severe on 3/17 TEE.   Was seen for surgery eval for MV repair and AoV replacement.  However, given porcelain aorta, she was deemed not a candidate for open heart surgery. She was sent for percutaneous MV clipping.  This was attempted by Dr Bridgette Habermann at [facility] but procedure failed due to the pattern  of MV calcification.  Subsequent TTEs were read as mild to moderate MR, but suspect they just did not visualize the MV effectively. TEE last week suggested ongoing severe MR with small flail segment of the anterior leaflet.  Unfortunately, there is no way to repair this.  May improve with correction of severe AS. - No change.   6. COPD: Moderate to severe by PFTs. - Continue Spiriva.   7. Anemia: Upper GI bleed in 3/18, possibly from gastric ulcer.  She is back on warfarin.  Recent colonoscopy with no bleeding source. - Hgb stable at 8.4  8. Type II diabetes: - Continue SSI - Linagliptin stopped with CHF.  - No change.   9. AKI:  - Improved with diuresis.   10. Insomnia: - I worry this may be making her too lethargic. Will reduce trazodone to 25 mg hs.   22. Deconditioning:PT recommends home health PT. Dr. Aundra Dubin thinks a short stent in rehab would be good to help strengthen her for TAVR. Will ask PT to reevaluate.   Length of Stay: [place], NP  05/16/2017, 7:25 AM  Advanced Heart Failure Team Pager [phone] (M-F; 7a - 4p)  Please contact [facility] for night-coverage after hours (4p -7a ) and weekends on [email or web address]  Patient seen and examined with Jettie Booze, NP. We discussed all aspects of the encounter. I agree with the assessment and plan as stated above.   Cath results reviewed with her. Filling pressures moderately elevated; no CAD. She is adamant about going home and refuses SNF. Will continue IV diuresis today and attempt to get pre-TAVR scans completed. Hopefully can get her home tomorrow.   Coumadin has been restarted.   Glori Bickers, MD  11:26 PM

## 2017-05-16 NOTE — Progress Notes (Signed)
Pt requesting eye drops for dry itchy eyes, MD notified with orders received and implemented.  Edward Qualia RN

## 2017-05-16 NOTE — Progress Notes (Signed)
ANTICOAGULATION CONSULT NOTE - Follow Up Consult  Pharmacy Consult for Coumadin Indication: atrial fibrillation  No Known Allergies  Patient Measurements: Height: 5' (152.4 cm) Weight: 155 lb 4.8 oz (70.4 kg) IBW/kg (Calculated) : 45.5  Vital Signs: Temp: 97.9 F (36.6 C) (08/09 1200) Temp Source: Oral (08/09 1200) BP: 118/80 (08/09 1200) Pulse Rate: 80 (08/09 1200)  Labs:  Recent Labs  05/14/17 0538 05/14/17 1237 05/15/17 0240 05/15/17 0758 05/15/17 1211 05/16/17 0330  HGB 9.1*  --   --  8.1*  --  8.4*  HCT 30.3*  --   --  27.8*  --  28.5*  PLT 179  --   --  166  --  182  LABPROT 22.7* 24.2* 23.6*  --  19.9* 18.0*  INR 1.96 2.13 2.06  --  1.67 1.48  CREATININE 1.60* 1.50* 1.58*  --   --  1.28*    Estimated Creatinine Clearance: 30.7 mL/min (A) (by C-G formula based on SCr of 1.28 mg/dL (H)).  Assessment: 47 yof on Coumadin PTA for Afib. INR 1.29 on admit. She has been off of her coumadin for a colonoscopy that was done Wednesday 7/25 and she resumed taking it 7/31. Coumadin was then held for cath on 8/8 and pharmacy to resume yesterday. She is noted s/p 2.5 mg po Vitamin K on 8/8. INR with slow trend up when coumadin was started previously.   -INR= 1.48 today  PTA dose: 5mg  daily except 2.5mg  Mon/Fri  Goal of Therapy:  INR 2-3 Monitor platelets by anticoagulation protocol: Yes   Plan:  -Coumadin 7.5 mg PO again today -Daily PT/INR -Monitor CBC and for signs/symptoms of bleeding   Diana L. Kyung Rudd, PharmD, [place] PGY1 Pharmacy Resident Pager: [phone]

## 2017-05-16 NOTE — Progress Notes (Signed)
CARDIAC REHAB PHASE I   PRE:  Rate/Rhythm: 88 a fib  BP:  Sitting: 11/59        SaO2: 100 3L  MODE:  Ambulation: 350 ft   POST:  Rate/Rhythm: 111 a fib  BP:  Sitting: 113/74         SaO2: 94 3L  Pt very alert, talkative, ready to walk. Pt ambulated 350 ft on 3L O2, rolling walker, gait belt, assist x2, steady gait, tolerated well. Pt c/o mild DOE, some fatigue with distance, denies any other complaints, declined rest stop. VSS. Pt to recliner after walk, feet elevated, call bell within reach. Will follow.   [place], RN, BSN 05/16/2017 2:12 PM

## 2017-05-17 ENCOUNTER — Inpatient Hospital Stay (HOSPITAL_COMMUNITY): Payer: Medicare HMO

## 2017-05-17 DIAGNOSIS — J449 Chronic obstructive pulmonary disease, unspecified: Secondary | ICD-10-CM

## 2017-05-17 DIAGNOSIS — I350 Nonrheumatic aortic (valve) stenosis: Secondary | ICD-10-CM

## 2017-05-17 DIAGNOSIS — I10 Essential (primary) hypertension: Secondary | ICD-10-CM

## 2017-05-17 LAB — BASIC METABOLIC PANEL
ANION GAP: 13 (ref 5–15)
BUN: 37 mg/dL — AB (ref 6–20)
CALCIUM: 9.6 mg/dL (ref 8.9–10.3)
CO2: 33 mmol/L — AB (ref 22–32)
Chloride: 95 mmol/L — ABNORMAL LOW (ref 101–111)
Creatinine, Ser: 1.56 mg/dL — ABNORMAL HIGH (ref 0.44–1.00)
GFR calc Af Amer: 35 mL/min — ABNORMAL LOW (ref >60)
GFR calc non Af Amer: 30 mL/min — ABNORMAL LOW (ref >60)
GLUCOSE: 162 mg/dL — AB (ref 65–99)
Potassium: 4 mmol/L (ref 3.5–5.1)
Sodium: 141 mmol/L (ref 135–145)

## 2017-05-17 LAB — PULMONARY FUNCTION TEST
FEF 25-75 Pre: 0.33 L/sec
FEF2575-%Pred-Pre: 28 %
FEV1-%PRED-PRE: 35 %
FEV1-PRE: 0.56 L
FEV1FVC-%Pred-Pre: 84 %
FEV6-%PRED-PRE: 45 %
FEV6-PRE: 0.9 L
FEV6FVC-%Pred-Pre: 106 %
FVC-%PRED-PRE: 42 %
FVC-PRE: 0.9 L
PRE FEV6/FVC RATIO: 100 %
Pre FEV1/FVC ratio: 62 %
RV % pred: 197 %
RV: 4.34 L
TLC % pred: 116 %
TLC: 5.18 L

## 2017-05-17 LAB — CBC
HCT: 28.4 % — ABNORMAL LOW (ref 36.0–46.0)
Hemoglobin: 8.3 g/dL — ABNORMAL LOW (ref 12.0–15.0)
MCH: 26.9 pg (ref 26.0–34.0)
MCHC: 29.2 g/dL — AB (ref 30.0–36.0)
MCV: 92.2 fL (ref 78.0–100.0)
PLATELETS: 175 10*3/uL (ref 150–400)
RBC: 3.08 MIL/uL — AB (ref 3.87–5.11)
RDW: 16 % — ABNORMAL HIGH (ref 11.5–15.5)
WBC: 6 10*3/uL (ref 4.0–10.5)

## 2017-05-17 LAB — PROTIME-INR
INR: 1.47
PROTHROMBIN TIME: 18 s — AB (ref 11.4–15.2)

## 2017-05-17 LAB — GLUCOSE, CAPILLARY
Glucose-Capillary: 130 mg/dL — ABNORMAL HIGH (ref 65–99)
Glucose-Capillary: 154 mg/dL — ABNORMAL HIGH (ref 65–99)
Glucose-Capillary: 270 mg/dL — ABNORMAL HIGH (ref 65–99)

## 2017-05-17 MED ORDER — POTASSIUM CHLORIDE CRYS ER 20 MEQ PO TBCR: 40.0000 meq | EXTENDED_RELEASE_TABLET | Freq: Every day | ORAL | 12 refills | Status: AC

## 2017-05-17 MED ORDER — METOPROLOL TARTRATE 25 MG PO TABS
25.0000 mg | ORAL_TABLET | Freq: Once | ORAL | Status: AC
  Administered 2017-05-17: 25 mg via ORAL
  Filled 2017-05-17: qty 1

## 2017-05-17 MED ORDER — WARFARIN SODIUM 5 MG PO TABS: ORAL_TABLET | ORAL | 12 refills | Status: AC

## 2017-05-17 MED ORDER — WARFARIN SODIUM 7.5 MG PO TABS
7.5000 mg | ORAL_TABLET | ORAL | Status: AC
  Administered 2017-05-17: 7.5 mg via ORAL
  Filled 2017-05-17: qty 1

## 2017-05-17 MED ORDER — TORSEMIDE 20 MG PO TABS
80.0000 mg | ORAL_TABLET | Freq: Two times a day (BID) | ORAL | Status: DC
  Administered 2017-05-17: 80 mg via ORAL
  Filled 2017-05-17: qty 4

## 2017-05-17 MED ORDER — METOPROLOL SUCCINATE ER 25 MG PO TB24: 75.0000 mg | ORAL_TABLET | Freq: Two times a day (BID) | ORAL | 12 refills | Status: AC

## 2017-05-17 NOTE — Progress Notes (Signed)
Physical Therapy Treatment Patient Details Name: Destiny [name] MRN: [id number] DOB: [birth year] Today's Date: 05/17/2017    History of Present Illness Ms Legacy is an 82 year old with history of HTN, DMII, chronic a fib, NIMC. Severe MR with failed MV clip, porcelain aorta, copd, anemia, GI bleed 2018 with non bleeding gastric ulcer, AKI, pulmonary htn, and diastolic heart failure admitted with volume overload. Recent Echo with worsening AS. TEE performed, plan for [facility] on 05/13/17 and possible TAVR candidate.    PT Comments    Pt continues to only consider return to home and adamantly refuses SNF. Pt acknowledges that she might not do well at home but that she has to try. Pt also stated that if she had qualified for home O2 she would refuse it. Pt understands that she is high risk at home but is choosing to try and maintain her independence    Follow Up Recommendations  Home health PT (Pt adamantly refuses SNF)     Equipment Recommendations  None recommended by PT    Recommendations for Other Services       Precautions / Restrictions Precautions Precautions: Fall Precaution Comments: watch O2 sats Restrictions Weight Bearing Restrictions: No    Mobility  Bed Mobility               General bed mobility comments: Pt up in chair.  Transfers Overall transfer level: Needs assistance Equipment used: Rolling walker (2 wheeled) Transfers: Sit to/from Stand Sit to Stand: Min assist;Min guard         General transfer comment: Min assist from low recliner. Min guard from bsc.  Ambulation/Gait Ambulation/Gait assistance: Supervision Ambulation Distance (Feet): 120 Feet Assistive device: Rolling walker (2 wheeled) Gait Pattern/deviations: Trunk flexed;Step-through pattern;Shuffle;Decreased step length - right;Decreased step length - left Gait velocity: decreased Gait velocity interpretation: <1.8 ft/sec, indicative of risk for recurrent falls General Gait Details:  Pt amb on RA for entire walk. Very difficult to get accurate reading of SpO2 with poor waveform. Eventually after warming hand able to get SpO2 reading of 92% toward the end of her walk.    Stairs            Wheelchair Mobility    Modified Rankin (Stroke Patients Only)       Balance Overall balance assessment: Needs assistance Sitting-balance support: No upper extremity supported Sitting balance-Leahy Scale: Good     Standing balance support: No upper extremity supported Standing balance-Leahy Scale: Fair                              Cognition Arousal/Alertness: Awake/alert Behavior During Therapy: WFL for tasks assessed/performed Overall Cognitive Status: Within Functional Limits for tasks assessed                                        Exercises      General Comments        Pertinent Vitals/Pain Pain Assessment: No/denies pain    Home Living                      Prior Function            PT Goals (current goals can now be found in the care plan section) Progress towards PT goals: Progressing toward goals    Frequency    Min 3X/week  PT Plan Current plan remains appropriate    Co-evaluation              AM-PAC PT "6 Clicks" Daily Activity  Outcome Measure  Difficulty turning over in bed (including adjusting bedclothes, sheets and blankets)?: A Little Difficulty moving from lying on back to sitting on the side of the bed? : A Little Difficulty sitting down on and standing up from a chair with arms (e.g., wheelchair, bedside commode, etc,.)?: Total Help needed moving to and from a bed to chair (including a wheelchair)?: A Little Help needed walking in hospital room?: A Little Help needed climbing 3-5 steps with a railing? : A Lot 6 Click Score: 15    End of Session   Activity Tolerance: Patient tolerated treatment well Patient left: in chair;with call bell/phone within reach Nurse  Communication: Mobility status PT Visit Diagnosis: Unsteadiness on feet (R26.81);Muscle weakness (generalized) (M62.81);Difficulty in walking, not elsewhere classified (R26.2)     Time: 7981-0254 PT Time Calculation (min) (ACUTE ONLY): 32 min  Charges:  $Gait Training: 23-37 mins                    G Codes:       [facility] PT [place] 05/17/2017, 3:45 PM

## 2017-05-17 NOTE — Care Management Note (Signed)
Case Management Note  Patient Details  Name: Destiny [name] MRN: [id number] Date of Birth: [birth year]  Subjective/Objective:  Severe aortic stenosis, Severe MR, Pulm HTN,  possible TAVR                    Action/Plan: Discharge Planning: NCM spoke to pt and lives at home alone, states her dtr will assist as needed. Pt states she has four-prong cane, two prong cane, RW and bedside commode at home. Offered choice for HH/list provided. Pt states she had [facility] in the past. [facility] with new referral for home. Pt did not need oxygen per note.   PCP Ledora Bottcher A MD    Expected Discharge Date:  05/17/17               Expected Discharge Plan:  [place]  In-House Referral:  NA  Discharge planning Services  CM Consult  Post Acute Care Choice:  Home Health Choice offered to:  Patient  DME Arranged:  N/A DME Agency:  NA  HH Arranged:  RN, PT [place] Agency:  [place]  Status of Service:  Completed, signed off  If discussed at [place] of Stay Meetings, dates discussed:    Additional Comments:  Erenest Rasher, RN 05/17/2017, 3:38 PM

## 2017-05-17 NOTE — Care Management Important Message (Signed)
Important Message  Patient Details  Name: Destiny [name] MRN: [id number] Date of Birth: Aug 23, 1935   Medicare Important Message Given:  Yes    Nathen May 05/17/2017, 9:31 AM

## 2017-05-17 NOTE — Progress Notes (Signed)
SATURATION QUALIFICATIONS: (This note is used to comply with regulatory documentation for home oxygen)  Patient Saturations on Room Air at Rest = 93%  Patient Saturations on Room Air while Ambulating = 92%  

## 2017-05-17 NOTE — Progress Notes (Signed)
Patient ID: Destiny [name], female   DOB: 03/08/1935, 82 y.o.   MRN: [id number]     Advanced Heart Failure Rounding Note  PCP: Dr Neta Mends  Primary Cardiologist: Dr Aundra Dubin  Subjective:    Wants to go home. Weight down 14 pounds total. Denies SOB, orthopnea.   Right and left heart cath 05/15/17 With no CAD and mildly elevated filling pressures (PCWP 20)   TEE: EF 55-60%, mildly dilated RV with normal systolic function, low gradient severe AS no AI, suspect severe eccentric MR from suspected small flail segment on anterior leaflet. Small ASD.    Objective:   Weight Range: 154 lb 12.2 oz (70.2 kg) Body mass index is 30.23 kg/m.   Vital Signs:   Temp:  [97.9 F (36.6 C)-98.9 F (37.2 C)] 98.4 F (36.9 C) (08/10 0540) Pulse Rate:  [80-98] 98 (08/09 2359) Resp:  [16-18] 18 (08/10 0443) BP: (104-128)/(61-80) 128/73 (08/10 0443) SpO2:  [93 %-99 %] 95 % (08/10 0443) Weight:  [154 lb 12.2 oz (70.2 kg)] 154 lb 12.2 oz (70.2 kg) (08/10 0443) Last BM Date: 05/13/17  Weight change: Filed Weights   05/15/17 0450 05/16/17 0459 05/17/17 0443  Weight: 153 lb 14.1 oz (69.8 kg) 155 lb 4.8 oz (70.4 kg) 154 lb 12.2 oz (70.2 kg)    Intake/Output:   Intake/Output Summary (Last 24 hours) at 05/17/17 0717 Last data filed at 05/17/17 0456  Gross per 24 hour  Intake              356 ml  Output             1775 ml  Net            -1419 ml      Physical Exam    General: Elderly female, NAD.  HEENT: Normal. Neck: Supple. JVP 9 cm Carotids 2+ bilat; + bruits. No thyromegaly or nodule noted. Cor: PMI non displaced, regular rate and rhythm., 3/6 AS murmur. 2/6 MR Lungs: CTAB Abdomen: Soft, non tender, non distended.  no HSM. No bruits or masses. +BS  Extremities: No cyanosis, clubbing, rash, 1+ R ankle edema.  Neuro: Alert & orientedx3, cranial nerves grossly intact. moves all 4 extremities w/o difficulty. Affect pleasant    Telemetry   Afib 80-90 bpm - personally reviewed.    Labs    CBC  Recent Labs  05/16/17 0330 05/17/17 0253  WBC 5.7 6.0  HGB 8.4* 8.3*  HCT 28.5* 28.4*  MCV 92.5 92.2  PLT 182 435   Basic Metabolic Panel  Recent Labs  05/15/17 0240 05/16/17 0330  NA 139 141  K 4.0 4.0  CL 94* 96*  CO2 39* 37*  GLUCOSE 174* 198*  BUN 37* 33*  CREATININE 1.58* 1.28*  CALCIUM 9.2 9.4   Liver Function Tests No results for input(s): AST, ALT, ALKPHOS, BILITOT, PROT, ALBUMIN in the last 72 hours.  BNP: BNP (last 3 results)  Recent Labs  11/02/16 1228 05/08/17 1247 05/08/17 1612  BNP 324.3* 685.7* 863.4*       Imaging   Transthoracic Echocardiography 05/03/17   Study Conclusions  - Left ventricle: The cavity size was normal. There was moderate   concentric hypertrophy. Systolic function was normal. The   estimated ejection fraction was in the range of 55% to 60%. Wall   motion was normal; there were no regional wall motion   abnormalities. The study was not technically sufficient to allow   evaluation of LV diastolic dysfunction due to atrial  fibrillation. - Aortic valve: There is moderate to severe AS. The mean AV   gradient is 23mmHg but the calculated AVA is 0.73cm2. Visually   the valve appears moderate to severely stenotic. Valve mobility   was restricted. There was moderate to severe stenosis. There was   moderate to severe regurgitation. Mean gradient (S): 20 mm Hg.   VTI ratio of LVOT to aortic valve: 0.29. Valve area (VTI): 0.73   cm^2. Valve area (Vmax): 0.9 cm^2. Valve area (Vmean): 0.81 cm^2. - Mitral valve: Calcified annulus. There was mild to moderate   regurgitation directed eccentrically and posteriorly. Valve area   by continuity equation (using LVOT flow): 1.38 cm^2. - Left atrium: The atrium was massively dilated. - Right ventricle: The cavity size was mildly dilated. Wall   thickness was normal. - Right atrium: The atrium was massively dilated. - Atrial septum: There was a small secundum  atrial septal defect.   There was a left-to-right shunt through an atrial septal defect,   in the baseline state. - Tricuspid valve: There was moderate-severe regurgitation. - Pulmonary arteries: PA peak pressure: 64 mm Hg (S). - Pericardium, extracardiac: A trivial, free-flowing pericardial   effusion was identified along the left ventricular free wall.  Impressions:  - Compared to prior echo, the aortic insufficiency has worsened, TR   has worsened and pulmonary HTN has increased. Moderate to severe   AS is present. The right ventricular systolic pressure was   increased consistent with moderate pulmonary hypertension.    Medications:     Scheduled Medications: . atorvastatin  20 mg Oral q1800  . calcium-vitamin D  1 tablet Oral Q breakfast  . dorzolamide-timolol  1 drop Both Eyes BID  . ferrous sulfate  325 mg Oral Q breakfast  . furosemide  80 mg Intravenous BID  . glipiZIDE  10 mg Oral Q breakfast  . insulin aspart  0-15 Units Subcutaneous TID WC  . latanoprost  1 drop Both Eyes QHS  . magnesium oxide  400 mg Oral Daily  . metoprolol succinate  75 mg Oral BID  . multivitamin with minerals  1 tablet Oral Daily  . pantoprazole  40 mg Oral Daily  . potassium chloride  40 mEq Oral Daily  . sodium chloride flush  3 mL Intravenous Q12H  . tiotropium  18 mcg Inhalation Daily  . Warfarin - Pharmacist Dosing Inpatient   Does not apply q1800    Infusions: . sodium chloride    . ferumoxytol Stopped (05/10/17 1119)    PRN Medications: sodium chloride, acetaminophen, albuterol, alum & mag hydroxide-simeth, HYDROcodone-acetaminophen, ondansetron (ZOFRAN) IV, polyvinyl alcohol, sodium chloride flush, traZODone    Patient Profile   Ms Destiny [name] is an 82 year old with history of HTN, DMII, chronic a fib, NIMC. Severe MR with failed MV clip, porcelain aorta, copd, anemia, GI bleed 2018 with non bleeding gastric ulcer, AKI, pulmonary htn, and diastolic heart failure admitted  with volume overload. Recent Echo with worsening AS.   Assessment/Plan   1. Acute on chronic diastolic CHF: Echo (0/27) with EF 30-35%, diffuse hypokinesis, improved to 45% on 3/17 TEE and then to 60-65% on 3/18 echo. EF remains normal at 55-60% on echo done in 7/18.  Nonischemic cardiomyopathy, no significant coronary disease on angiography. Acute decompensation may be related to her valvular disease with worsening AS and ongoing MR.  She was admitted 8/1 with hypotension, AKI and volume overload.  BP now stable.   - Volume status improved. Transition to po  torsemide 80 mg BID.  - With normal EF, no Entresto.   2. Atrial fibrillation: Chronic, HR better controlled on Toprol XL 75 mg bid.  - Continue warfarin for anticoagulation. INR 1.47.   3. Aortic stenosis: Severe AS on TEE 8/3, no AI.  Suspect this has worsened.  Likely that valvular heart disease has triggered her decompensation. Seen by Dr Angelena Form for TAVR evaluation.   - Plan tentatively for TAVR on 8/28 - Creatinine 1.56 today. Will have to hold off on CT scans.   4. Pulmonary hypertension: Mild to moderate pulmonary hypertension on RHC but PVR not significantly elevated, suspect pulmonary venous hypertension.  - No change  5. Mitral regurgitation: Per Dr. Aundra Dubin.  - Suspected rupture of an anterior leaflet chord with very eccentric mitral regurgitation. MR was severe on 3/17 TEE.   Was seen for surgery eval for MV repair and AoV replacement.  However, given porcelain aorta, she was deemed not a candidate for open heart surgery. She was sent for percutaneous MV clipping.  This was attempted by Dr Bridgette Habermann at [facility] but procedure failed due to the pattern of MV calcification.  Subsequent TTEs were read as mild to moderate MR, but suspect they just did not visualize the MV effectively. TEE last week suggested ongoing severe MR with small flail segment of the anterior leaflet.  Unfortunately, there is no way to repair this.  May improve with  correction of severe AS. - No change  6. COPD: Moderate to severe by PFTs. - Continue Spiriva.  - Stable, no change.   7. Anemia: Upper GI bleed in 3/18, possibly from gastric ulcer.  She is back on warfarin.  Recent colonoscopy with no bleeding source. - Hgb stable at 8.3  8. Type II diabetes: - Continue SSI - Linagliptin stopped with CHF.  - No change.   9. AKI:  - Improved, BMET pending.   10. Insomnia: - Continue trazadone 25 mg hs.   11. Deconditioning: talked with Dr. Cyndia Bent. There is concern that she is unsafe to go home, I have asked PT to re evaluate her. But she is adamant that she will not go to SNF.   Length of Stay: [place], NP  05/17/2017, 7:17 AM  Advanced Heart Failure Team Pager [phone] (M-F; [place])  Please contact [facility] for night-coverage after hours (4p -7a ) and weekends on [email or web address]  Patient seen and examined with Jettie Booze, NP. We discussed all aspects of the encounter. I agree with the assessment and plan as stated above.   She feels good today. Adamant that she wants to go home and refuses SNF. Volume status looks good. Probably a little dry as creatininee has bumped. Wil lplan d/c today. We have d/w TAVR team and they are ok with doing scans as outpatient to determine if she will qualify for TAVR.   Glori Bickers, MD  1:34 PM

## 2017-05-17 NOTE — Consult Note (Signed)
[facility]  MULTIDISCIPLINARY HEART VALVE CLINIC  CARDIOTHORACIC SURGERY CONSULTATION REPORT  Referring Provider is No ref. provider found PCP is Corrington, Kip A, MD  Reason for consultation: Severe aortic stenosis   HPI:  The patient is an 82 year old woman with a history of DM,  hypertension, severe COPD by PFT's, chronic atrial fibrillation, chronic diastolic heart failure with a history of moderate aortic stenosis and severe mitral regurgitation due to a partial flail of the anterior leaflet. She was seen by Dr. Roxy Manns in April 2017 for consideration of surgical treatment of her mitral valve disease and he felt that she needed AVR along with mitral valve repair. Her echo at that time showed severe aortic stenosis with a peak velocity of 4.4 m/s. Unfortunately CT of the chest showed severe diffuse aortic atherosclerosis with a porcelain ascending aorta and she was not felt to be a surgical candidate. She was referred to [facility] for MitraClip which was unsuccessful due to her anatomy. She reportedly had stable symptoms after that on medical therapy and has been living independently at home. She was seen back in the heart failure clinic recently with increasing dyspnea that did not improve with increasing diuretic. She was having shortness of breath with minimal exertion and orthopnea. Her 2D echo on 05/03/2017 showed severe AS with a mean gradient of 20 mm Hg but a calculated valve area of 0.73 cm2. The aortic valve appeared severely stenotic with calcified, poorly mobile leaflets. There was mild to moderate MR that was directed eccentrically and posteriorly and moderate to severe TR with a small ASD likely due to the transeptal puncture for her Mitraclip procedure. She was admitted on 8/1 for IV diuresis. A TEE on 8/3 by Dr. Aundra Dubin showed a severely stenotic appearing AV with a planimetered AVA of 0.3 cm2 with a mean gradient of 31 mm Hg. There was likely severe MR with systolic flow  reversal in 1 out of 2 pulmonary veins sampled and moderate to severe TR. LVEF was 55-60%.  Past Medical History:  Diagnosis Date  . Anemia   . Aortic calcification (HCC) 02/20/2016   Involving entire thoracic and abdominal aorta  . Arthritis   . Atrial fibrillation ([place]) 1999-diagnosed  . Complication of anesthesia    slow to awaken (05/08/2017)  . Diastolic CHF, chronic ([place]) 12/30/2014  . Edema   . Glaucoma, both eyes   . History of blood transfusion 03/2017   "related to anemia"  . History of kidney stones   . Incidental pulmonary nodule    Results of CT scan:  Lungs/Pleura: There is some progressive scarring and reticulonodular opacity in the left upper lobe and lingula extending to the anterior pleural surface. There is some associated mild traction bronchiectasis in the lingula. Findings are suggestive of postinflammatory/postinfectious changes. Recommend correlation with any active infectious symptoms. CT follow-up would be appro  . Mitral valve insufficiency and aortic valve insufficiency   . Mononeuritis of unspecified site   . Osteoarthrosis, unspecified whether generalized or localized, unspecified site    "all over" (05/08/2017)  . Pure hypercholesterolemia   . Skin cancer    left temple  . Type II diabetes mellitus ([place])   . Unspecified essential hypertension     Past Surgical History:  Procedure Laterality Date  . ABDOMINAL HYSTERECTOMY  2000   partial  . APPENDECTOMY  1954  . CARDIAC CATHETERIZATION N/A 11/16/2015   Procedure: Right/Left Heart Cath and Coronary Angiography;  Surgeon: Larey Dresser, MD;  Location: [place] [facility];  Service: Cardiovascular;  Laterality: N/A;  . CATARACT EXTRACTION W/ INTRAOCULAR LENS IMPLANT Left   . CYSTOSCOPY W/ RETROGRADES Right 11/26/2012   Procedure: CYSTOSCOPY WITH RETROGRADE PYELOGRAM;  Surgeon: Alexis Frock, MD;  Location: [facility];  Service: Urology;  Laterality: Right;  . ESOPHAGOGASTRODUODENOSCOPY (EGD)  WITH PROPOFOL N/A 01/03/2017   Procedure: ESOPHAGOGASTRODUODENOSCOPY (EGD) WITH PROPOFOL;  Surgeon: Clarene Essex, MD;  Location: [facility] ENDOSCOPY;  Service: Endoscopy;  Laterality: N/A;  . HOLMIUM LASER APPLICATION Right 3/66/4403   Procedure: HOLMIUM LASER APPLICATION;  Surgeon: Alexis Frock, MD;  Location: [facility];  Service: Urology;  Laterality: Right;  . RIGHT/LEFT HEART CATH AND CORONARY ANGIOGRAPHY N/A 05/15/2017   Procedure: RIGHT/LEFT HEART CATH AND CORONARY ANGIOGRAPHY;  Surgeon: Larey Dresser, MD;  Location: [place] [facility];  Service: Cardiovascular;  Laterality: N/A;  . SKIN CANCER EXCISION Left    temple  . TEE WITHOUT CARDIOVERSION N/A 12/08/2015   Procedure: TRANSESOPHAGEAL ECHOCARDIOGRAM (TEE);  Surgeon: Larey Dresser, MD;  Location: [place];  Service: Cardiovascular;  Laterality: N/A;  . TEE WITHOUT CARDIOVERSION N/A 05/10/2017   Procedure: TRANSESOPHAGEAL ECHOCARDIOGRAM (TEE);  Surgeon: Larey Dresser, MD;  Location: [facility] ENDOSCOPY;  Service: Cardiovascular;  Laterality: N/A;  . TONSILLECTOMY  1943  . TOTAL KNEE ARTHROPLASTY Right 2009  . TRANSESOPHAGEAL ECHOCARDIOGRAM  02/2012    Family History  Problem Relation Age of Onset  . Cancer Mother 58       leukemia   . Breast cancer Neg Hx   . Colon cancer Neg Hx     Social History   Social History  . Marital status: Widowed    Spouse name: N/A  . Number of children: N/A  . Years of education: N/A   Occupational History  . Not on file.   Social History Main Topics  . Smoking status: Former Smoker    Packs/day: 3.50    Years: 34.00    Types: Cigarettes    Quit date: 07/11/1988  . Smokeless tobacco: Never Used  . Alcohol use Yes     Comment: 05/08/2017 "none in ages"  . Drug use: No  . Sexual activity: No   Other Topics Concern  . Not on file   Social History Narrative   Widow. Lives alone. Attended business college.   Lives in one story home. Has living will.    Walker and/or four  pronged cane for assistance.    Former smoker of 29 years. Quit smoking 1989.   Patient drinks caffeinated beverages, takes a daily vitamin.   Wears her seatbelt, wears dentures, smoke detector located in the home.       Current Facility-Administered Medications  Medication Dose Route Frequency Provider Last Rate Last Dose  . 0.9 %  sodium chloride infusion  250 mL Intravenous PRN Larey Dresser, MD      . acetaminophen (TYLENOL) tablet 650 mg  650 mg Oral Q4H PRN Larey Dresser, MD      . albuterol (PROVENTIL) (2.5 MG/3ML) 0.083% nebulizer solution 2.5 mg  2.5 mg Inhalation Q2H PRN Larey Dresser, MD   2.5 mg at 05/14/17 1153  . alum & mag hydroxide-simeth (MAALOX/MYLANTA) 200-200-20 MG/5ML suspension 30 mL  30 mL Oral Q6H PRN Clegg, Amy D, NP   30 mL at 05/13/17 0853  . atorvastatin (LIPITOR) tablet 20 mg  20 mg Oral q1800 Clegg, Amy D, NP   20 mg at 05/16/17 1742  . calcium-vitamin  D (OSCAL WITH D) 500-200 MG-UNIT per tablet 1 tablet  1 tablet Oral Q breakfast Larey Dresser, MD   1 tablet at 05/16/17 [id number]  . dorzolamide-timolol (COSOPT) 22.3-6.8 MG/ML ophthalmic solution 1 drop  1 drop Both Eyes BID Clegg, Amy D, NP   1 drop at 05/16/17 2207  . ferrous sulfate tablet 325 mg  325 mg Oral Q breakfast Clegg, Amy D, NP   325 mg at 05/16/17 0813  . ferumoxytol (FERAHEME) 510 mg in sodium chloride 0.9 % 100 mL IVPB  510 mg Intravenous Weekly Jettie Booze E, NP   Stopped at 05/10/17 1119  . furosemide (LASIX) injection 80 mg  80 mg Intravenous BID Larey Dresser, MD   80 mg at 05/16/17 1741  . glipiZIDE (GLUCOTROL XL) 24 hr tablet 10 mg  10 mg Oral Q breakfast Clegg, Amy D, NP   10 mg at 05/16/17 0815  . HYDROcodone-acetaminophen (NORCO) 7.5-325 MG per tablet 1 tablet  1 tablet Oral Q6H PRN Geralynn Ochs T, MD   1 tablet at 05/16/17 2207  . insulin aspart (novoLOG) injection 0-15 Units  0-15 Units Subcutaneous TID WC Clegg, Amy D, NP   2 Units at 05/16/17 1742  . latanoprost (XALATAN)  0.005 % ophthalmic solution 1 drop  1 drop Both Eyes QHS Clegg, Amy D, NP   1 drop at 05/16/17 2207  . magnesium oxide (MAG-OX) tablet 400 mg  400 mg Oral Daily Clegg, Amy D, NP   400 mg at 05/16/17 0814  . metoprolol succinate (TOPROL-XL) 24 hr tablet 75 mg  75 mg Oral BID Larey Dresser, MD   75 mg at 05/16/17 1742  . multivitamin with minerals tablet 1 tablet  1 tablet Oral Daily Clegg, Amy D, NP   1 tablet at 05/16/17 0813  . ondansetron (ZOFRAN) injection 4 mg  4 mg Intravenous Q6H PRN Clegg, Amy D, NP      . pantoprazole (PROTONIX) EC tablet 40 mg  40 mg Oral Daily Clegg, Amy D, NP   40 mg at 05/16/17 0814  . polyvinyl alcohol (LIQUIFILM TEARS) 1.4 % ophthalmic solution 1 drop  1 drop Both Eyes PRN Clegg, Amy D, NP      . potassium chloride SA (K-DUR,KLOR-CON) CR tablet 40 mEq  40 mEq Oral Daily Jettie Booze E, NP   40 mEq at 05/16/17 0814  . sodium chloride flush (NS) 0.9 % injection 3 mL  3 mL Intravenous Q12H Larey Dresser, MD   3 mL at 05/16/17 2207  . sodium chloride flush (NS) 0.9 % injection 3 mL  3 mL Intravenous PRN Larey Dresser, MD      . tiotropium [facility]) inhalation capsule 18 mcg  18 mcg Inhalation Daily Clegg, Amy D, NP   18 mcg at 05/14/17 0758  . traZODone (DESYREL) tablet 25 mg  25 mg Oral QHS PRN Bridgett Larsson, [facility]      . Warfarin - Pharmacist Dosing Inpatient   Does not apply q1800 Kris Mouton, RPH        No Known Allergies    Review of Systems:   General:  normal appetite, poor energy, has weight gain, no weight loss, no fever  Cardiac:  no chest pain with exertion, no chest pain at rest, has SOB with minimal exertion, has resting SOB, no PND, has orthopnea, has palpitations, has arrhythmia, chronic atrial fibrillation, has LE edema, no dizzy spells, no syncope  Respiratory:  Exertional and rest shortness  of breath, no home oxygen, no productive cough, no dry cough, no bronchitis, no wheezing, no hemoptysis, no asthma, no pain with inspiration or  cough, no sleep apnea, no CPAP at night  GI:   no difficulty swallowing, no reflux, no frequent heartburn, no hiatal hernia, no abdominal pain, no constipation, no diarrhea, no hematochezia, no hematemesis, no melena  GU:   no dysuria,  no frequency, no urinary tract infection, no hematuria,  no kidney stones, no kidney disease  Vascular:  no pain suggestive of claudication, no pain in feet, no leg cramps, no varicose veins, no DVT, no non-healing foot ulcer  Neuro:   no stroke, no TIA's, no seizures, no headaches, no temporary blindness one eye,  no slurred speech, no peripheral neuropathy, no chronic pain, no instability of gait, no memory/cognitive dysfunction  Musculoskeletal: has arthritis, no joint swelling, no myalgias, no difficulty walking, normal mobility   Skin:   no rash, no itching, no skin infections, no pressure sores or ulcerations  Psych:   no anxiety, no depression, no nervousness, no unusual recent stress  Eyes:   no blurry vision, no floaters, no recent vision changes,  wears glasses   ENT:   no hearing loss, no loose or painful teeth, wears dentures  Hematologic:  no easy bruising, no abnormal bleeding, no clotting disorder, no frequent epistaxis   Endocrine:  has diabetes, does check CBG's at home      Physical Exam:   BP 128/73   Pulse (!) 101   Temp 98.2 F (36.8 C) (Oral)   Resp 19   Ht 5' (1.524 m)   Wt 70.2 kg (154 lb 12.2 oz)   SpO2 97%   BMI 30.23 kg/m   General:  Elderly, frail-appearing  HEENT:  Unremarkable, NCAT, PERLA, EOMI, oropharynx clear, edentulous  Neck:   no JVD, transmitted murmur or bruit both sides of neck, no adenopathy, or thyromegaly  Chest:   clear to auscultation, diminished breath sounds both lower lobes, no wheezes, no rhonchi   CV:   RRR, grade III/VI crescendo/decrescendo murmur heard best at RSB, lll/Vl systolic murmur at apex to axilla,  no diastolic murmur  Abdomen:  soft, obese, non-tender, no masses or  organomegaly  Extremities:  warm, well-perfused, pulses not palpable in feet, mild ankle edema bilat  Rectal/GU  Deferred  Neuro:   Lethargic but grossly non-focal and symmetrical throughout. Answers my questions slowly and says she does not remember very much.   Skin:   Clean and dry, no rashes, no breakdown   Diagnostic Tests:    *[facility]*                   *[facility]*                         [address], [place] [address]                            [phone]  ------------------------------------------------------------------- Transthoracic Echocardiography  (Report amended )  Patient:    Destiny [name], Destiny [name] MR #:       [id number] Study Date: 05/03/2017 Gender:     F Age:        16 Height:     152.4 cm Weight:  78.4 kg BSA:        1.86 m^2 Pt. Status: Room:   ATTENDING    Corrington, Kip A  REFERRING    Corrington, Kip A  ORDERING     Loralie Champagne, M.D.  REFERRING    Loralie Champagne, M.D.  PERFORMING   [facility], Outpatient  SONOGRAPHER  [place]  cc:  ------------------------------------------------------------------- LV EF: 55% -   60%  ------------------------------------------------------------------- Indications:      CHF - 428.0.  ------------------------------------------------------------------- History:   PMH:   Atrial fibrillation.  Risk factors: Hypertension. Diabetes mellitus.  ------------------------------------------------------------------- Study Conclusions  - Left ventricle: The cavity size was normal. There was moderate   concentric hypertrophy. Systolic function was normal. The   estimated ejection fraction was in the range of 55% to 60%. Wall   motion was normal; there were no regional wall motion   abnormalities. The study was not technically sufficient to allow   evaluation of LV diastolic dysfunction due to atrial   fibrillation. - Aortic valve: There is  moderate to severe AS. The mean AV   gradient is 68mmHg but the calculated AVA is 0.73cm2. Visually   the valve appears moderate to severely stenotic. Valve mobility   was restricted. There was moderate to severe stenosis. There was   moderate to severe regurgitation. Mean gradient (S): 20 mm Hg.   VTI ratio of LVOT to aortic valve: 0.29. Valve area (VTI): 0.73   cm^2. Valve area (Vmax): 0.9 cm^2. Valve area (Vmean): 0.81 cm^2. - Mitral valve: Calcified annulus. There was mild to moderate   regurgitation directed eccentrically and posteriorly. Valve area   by continuity equation (using LVOT flow): 1.38 cm^2. - Left atrium: The atrium was massively dilated. - Right ventricle: The cavity size was mildly dilated. Wall   thickness was normal. - Right atrium: The atrium was massively dilated. - Atrial septum: There was a small secundum atrial septal defect.   There was a left-to-right shunt through an atrial septal defect,   in the baseline state. - Tricuspid valve: There was moderate-severe regurgitation. - Pulmonary arteries: PA peak pressure: 64 mm Hg (S). - Pericardium, extracardiac: A trivial, free-flowing pericardial   effusion was identified along the left ventricular free wall.  Impressions:  - Compared to prior echo, the aortic insufficiency has worsened, TR   has worsened and pulmonary HTN has increased. Moderate to severe   AS is present. The right ventricular systolic pressure was   increased consistent with moderate pulmonary hypertension.  ------------------------------------------------------------------- Study data:  The previous study was not available, so comparison was made to the report of 12/27/2016.  Study status:  Routine. Procedure:  Transthoracic echocardiography. Image quality was adequate.  Study completion:  There were no complications. Transthoracic echocardiography.  M-mode, complete 2D, spectral Doppler, and color Doppler.  Birthdate:  Patient  birthdate: 1935-05-24.  Age:  Patient is 82 yr old.  Sex:  Gender: female. BMI: 33.7 kg/m^2.  Blood pressure:     104/46  Patient status: Outpatient.  Study date:  Study date: 05/03/2017. Study time: 03:11 PM.  Location:  Echo laboratory.  -------------------------------------------------------------------  ------------------------------------------------------------------- Left ventricle:  The cavity size was normal. There was moderate concentric hypertrophy. Systolic function was normal. The estimated ejection fraction was in the range of 55% to 60%. Wall motion was normal; there were no regional wall motion abnormalities. Normal sinus rhythm was absent. The study was not technically sufficient to allow evaluation of LV diastolic dysfunction due to atrial fibrillation.  ------------------------------------------------------------------- Aortic  valve:  There is moderate to severe AS. The mean AV gradient is 15mmHg but the calculated AVA is 0.73cm2. Visually the valve appears moderate to severely stenotic.  Probably trileaflet; severely thickened, severely calcified leaflets. Valve mobility was restricted.  Doppler:   There was moderate to severe stenosis. There was moderate to severe regurgitation.    VTI ratio of LVOT to aortic valve: 0.29. Valve area (VTI): 0.73 cm^2. Indexed valve area (VTI): 0.39 cm^2/m^2. Peak velocity ratio of LVOT to aortic valve: 0.36. Valve area (Vmax): 0.9 cm^2. Indexed valve area (Vmax): 0.48 cm^2/m^2. Mean velocity ratio of LVOT to aortic valve: 0.32. Valve area (Vmean): 0.81 cm^2. Indexed valve area (Vmean): 0.44 cm^2/m^2.    Mean gradient (S): 20 mm Hg. Peak gradient (S): 41 mm Hg.  ------------------------------------------------------------------- Aorta:  Aortic root: The aortic root was normal in size.  ------------------------------------------------------------------- Mitral valve:   Calcified annulus. Mobility was not restricted. Doppler:   Transvalvular velocity was within the normal range. There was no evidence for stenosis. There was mild to moderate regurgitation directed eccentrically and posteriorly.    Valve area by pressure half-time: 3.19 cm^2. Indexed valve area by pressure half-time: 1.72 cm^2/m^2. Valve area by continuity equation (using LVOT flow): 1.38 cm^2. Indexed valve area by continuity equation (using LVOT flow): 0.74 cm^2/m^2.    Mean gradient (D): 4 mm Hg. Peak gradient (D): 9 mm Hg.  ------------------------------------------------------------------- Left atrium:  The atrium was massively dilated.  ------------------------------------------------------------------- Atrial septum:  There was a small secundum atrial septal defect. There was a left-to-right shunt through an atrial septal defect, in the baseline state.  ------------------------------------------------------------------- Right ventricle:  The cavity size was mildly dilated. Wall thickness was normal. Systolic function was normal.  ------------------------------------------------------------------- Pulmonic valve:    Structurally normal valve.   Cusp separation was normal.  Doppler:  Transvalvular velocity was within the normal range. There was no evidence for stenosis. There was no regurgitation.  ------------------------------------------------------------------- Tricuspid valve:   Structurally normal valve.    Doppler: Transvalvular velocity was within the normal range. There was moderate-severe regurgitation.  ------------------------------------------------------------------- Pulmonary artery:   The main pulmonary artery was normal-sized. Systolic pressure was within the normal range.  ------------------------------------------------------------------- Right atrium:  The atrium was massively dilated.  ------------------------------------------------------------------- Pericardium:  A trivial, free-flowing pericardial  effusion was identified along the left ventricular free wall.  ------------------------------------------------------------------- Systemic veins: Inferior vena cava: The vessel was dilated. The respirophasic diameter changes were blunted (< 50%), consistent with elevated central venous pressure.  ------------------------------------------------------------------- Measurements   Left ventricle                           Value          Reference  LV ID, ED, PLAX chordal                  45.5  mm       43 - 52  LV ID, ES, PLAX chordal                  30.7  mm       23 - 38  LV fx shortening, PLAX chordal           33    %        >=29  LV PW thickness, ED                      12.6  mm       ----------  IVS/LV PW ratio, ED                      1.03           <=1.3  Stroke volume, 2D                        47    ml       ----------  Stroke volume/bsa, 2D                    25    ml/m^2   ----------    Ventricular septum                       Value          Reference  IVS thickness, ED                        13    mm       ----------    LVOT                                     Value          Reference  LVOT ID, S                               18    mm       ----------  LVOT area                                2.54  cm^2     ----------  LVOT peak velocity, S                    114   cm/s     ----------  LVOT mean velocity, S                    64.6  cm/s     ----------  LVOT VTI, S                              18.4  cm       ----------  LVOT peak gradient, S                    5     mm Hg    ----------    Aortic valve                             Value          Reference  Aortic valve peak velocity, S            321   cm/s     ----------  Aortic valve mean velocity, S            203   cm/s     ----------  Aortic valve VTI, S                      63.9  cm       ----------  Aortic  mean gradient, S                  20    mm Hg    ----------  Aortic peak gradient, S                  41     mm Hg    ----------  VTI ratio, LVOT/AV                       0.29           ----------  Aortic valve area, VTI                   0.73  cm^2     ----------  Aortic valve area/bsa, VTI               0.39  cm^2/m^2 ----------  Velocity ratio, peak, LVOT/AV            0.36           ----------  Aortic valve area, peak velocity         0.9   cm^2     ----------  Aortic valve area/bsa, peak              0.48  cm^2/m^2 ----------  velocity  Velocity ratio, mean, LVOT/AV            0.32           ----------  Aortic valve area, mean velocity         0.81  cm^2     ----------  Aortic valve area/bsa, mean              0.44  cm^2/m^2 ----------  velocity    Aorta                                    Value          Reference  Aortic root ID, ED                       31    mm       ----------    Left atrium                              Value          Reference  LA ID, A-P, ES                           72    mm       ----------  LA ID/bsa, A-P                   (H)     3.88  cm/m^2   <=2.2  LA volume, S                             110   ml       ----------  LA volume/bsa, S                         59.2  ml/m^2   ----------  LA volume, ES, 1-p A4C  120   ml       ----------  LA volume/bsa, ES, 1-p A4C               64.6  ml/m^2   ----------  LA volume, ES, 1-p A2C                   95.9  ml       ----------  LA volume/bsa, ES, 1-p A2C               51.6  ml/m^2   ----------    Mitral valve                             Value          Reference  Mitral E-wave peak velocity              151   cm/s     ----------  Mitral mean velocity, D                  90.3  cm/s     ----------  Mitral deceleration time         (H)     243   ms       150 - 230  Mitral pressure half-time                69    ms       ----------  Mitral mean gradient, D                  4     mm Hg    ----------  Mitral peak gradient, D                  9     mm Hg    ----------  Mitral valve area, PHT, DP                3.19  cm^2     ----------  Mitral valve area/bsa, PHT, DP           1.72  cm^2/m^2 ----------  Mitral valve area, LVOT                  1.38  cm^2     ----------  continuity  Mitral valve area/bsa, LVOT              0.74  cm^2/m^2 ----------  continuity  Mitral annulus VTI, D                    33.9  cm       ----------    Pulmonary arteries                       Value          Reference  PA pressure, S, DP               (H)     64    mm Hg    <=30    Tricuspid valve                          Value          Reference  Tricuspid regurg peak velocity           349   cm/s     ----------  Tricuspid peak RV-RA gradient            49    mm Hg    ----------    Right atrium                             Value          Reference  RA ID, S-I, ES, A4C              (H)     69.7  mm       34 - 49  RA area, ES, A4C                 (H)     34.5  cm^2     8.3 - 19.5  RA volume, ES, A/L                       134   ml       ----------  RA volume/bsa, ES, A/L                   72.1  ml/m^2   ----------    Systemic veins                           Value          Reference  Estimated CVP                            15    mm Hg    ----------    Right ventricle                          Value          Reference  RV ID, minor axis, ED, A4C base          42.9  mm       ----------  TAPSE                                    10.2  mm       ----------  RV pressure, S, DP               (H)     64    mm Hg    <=30  RV s&', lateral, S                        6.64  cm/s     ----------  Legend: (L)  and  (H)  mark values outside specified reference range.  ------------------------------------------------------------------- Michaelle Birks, MD [id number]:[id number]    Procedure: TEE  Indication: Aortic stenosis, mitral regurgitation.   Sedation: Versed 2 mg IV, Fentanyl 25 mcg IV   Findings: Please see echo section for full report.  Severely dilated right atrium.  Severely dilated left atrium with no  LA appendage thrombus.  There was a small atrial septal defect, primarily left to right flow but with valsalva bubbles crossed on bubble study.  Suspect this is from prior septal puncture (with attempted mitral valve clipping).  The left ventricle was normal in size with mild LV hypertrophy.  EF 55-60%.  No regional wall motion abnormalities  noted.  Mildly dilated RV with normal systolic function.  The aortic valve was severely calcified and trileaflet.  AVA about 0.4 cm^2 with mean gradient 31 mmHg.  Planimetered aortic valve area about 0.3 cm^2.  Suspect low gradient severe AS.  There was no significant aortic insufficiency.  The mitral valve was mildly calcified.  There appeared to be a small flail segment of the anterior mitral leaflet.  There was very eccentric posteriorly-directed mitral regurgitation curling around to the back of the left atrium. I suspect that mitral regurgitation is still severe.  There was systolic flow reversal in the pulmonary doppler pattern on 1 out of 2 pulmonary veins sampled.  Normal caliber thoracic aorta.  Moderate to severe tricuspid regurgitation with peak RV-RA gradient 52 mmHg.    Impression:  1. Low gradient severe AS.  2. Severe, eccentric MR (similar to prior TEE), likely from flail segment of anterior leaflet.  3. Moderate to severe TR>  4. Normal LV systolic function.   Loralie Champagne 05/10/2017 3:15 PM    [facility]  CARDIAC CATHETERIZATION  Order# [id number]  Reading physician: Larey Dresser, MD Ordering physician: Larey Dresser, MD Study date: 05/15/17  Physicians   Panel Physicians Referring Physician Case Authorizing Physician  Larey Dresser, MD (Primary)    Procedures   RIGHT/LEFT HEART CATH AND CORONARY ANGIOGRAPHY  Conclusion   1. Filling pressures remain elevated, R>L.  2. Preserved cardiac output.  3. Moderate pulmonary hypertension, mixed pulmonary venous and pulmonary arterial hypertension  4. No significant CAD.    Will resume IV Lasix later this evening and will resume warfarin this evening.   Procedural Details/Technique   Technical Details Procedure: Right Heart Cath, Selective Coronary Angiography  Indication: Pre-TAVR evaluation   Procedural Details: The right groin and right brachial area were prepped, draped, and anesthetized with 1% lidocaine. There was a pre-existing peripheral IV in the right brachial area. This was replaced with a 60F venous sheath. A Swan-Ganz catheter was used for the right heart catheterization. Standard protocol was followed for recording of right heart pressures and sampling of oxygen saturations. Fick cardiac output was calculated. Using the modified Seldinger technique a 7 French sheath was placed in the right femoral vein. Standard Judkins catheters were used for selective coronary angiography. There were no immediate procedural complications. The patient was transferred to the post catheterization recovery area for further monitoring.   Estimated blood loss <50 mL.    Coronary Findings   Dominance: Right  Left Main  No significant CAD.  Left Anterior Descending  No significant CAD.  Left Circumflex  No significant CAD.  Right Coronary Artery  Shelf-like stenosis in the proximal RCA, no more than 30% luminal narrowing.  Right Heart   Right Heart Pressures RHC Procedural Findings: Hemodynamics (mmHg) RA mean 17 RV 64/16 PA 63/30, mean 44 PCWP mean 22 AO 115/58  Oxygen saturations: PA 64% AO 100%  Cardiac Output (Fick) 4.99  Cardiac Index (Fick) 2.99 PVR 4.4 WU    Wall Motion   Not done, elevated creatinine.         Implants     No implant documentation for this case.  PACS Images   Show images for CARDIAC CATHETERIZATION   Link to Procedure Log   Procedure Log    Hemo Data    Most Recent Value  Fick Cardiac Output 4.99 L/min  Fick Cardiac Output Index 2.99 (L/min)/BSA  RA A Wave 15 mmHg  RA V Wave 21 mmHg  RA Mean 17 mmHg   RV Systolic Pressure 64 mmHg  RV Diastolic Pressure 10 mmHg  RV EDP 16 mmHg  PA Systolic Pressure 63 mmHg  PA Diastolic Pressure 27 mmHg  PA Mean 41 mmHg  PW A Wave 19 mmHg  PW V Wave 26 mmHg  PW Mean 20 mmHg  AO Systolic Pressure 670 mmHg  AO Diastolic Pressure 63 mmHg  AO Mean 84 mmHg  QP/QS 1  TPVR Index 14.74 HRUI  TSVR Index 26.8 HRUI  PVR SVR Ratio 0.35  TPVR/TSVR Ratio 0.55    RISK SCORES About the STS Risk Calculator Procedure: AV Replacement  Risk of Mortality: 8.781%  Morbidity or Mortality: 36.067%  Long Length of Stay: 22.262%  Short Length of Stay: 11.572%  Permanent Stroke: 2.749%  Prolonged Ventilation: 28.256%  DSW Infection: 0.395%  Renal Failure: 13.285%  Reoperation: 10.57%    Impression:  This 82 year old woman has stage D severe symptomatic low gradient severe aortic stenosis in addition to severe MR, severe pulmonary hypertension and moderate to severe TR. I have personally reviewed her echo, TEE, and cath. The aortic valve is trileaflet, severely calcified and poorly mobile with an AVA of 0.3 cm2 by planimetry. The mean gradient was 20 mm Hg. She had severe AS documented over a year ago by echo. 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 I think TAVR is probably the best alternative at this point and may improve her symptomatically although she will still have significant MR and TR. Hopefully resolving the severe AS will decrease the amount of MR. Her cath shows no significant CAD. LV function is normal.  The patient was counseled at length regarding treatment alternatives for management of severe symptomatic aortic stenosis. The risks and benefits of surgical intervention has been discussed in detail. Long-term prognosis with medical therapy was discussed. Alternative approaches such as conventional surgical aortic valve replacement which she is not a candidate for, transcatheter aortic valve replacement, and palliative medical therapy were  compared and contrasted at length. This discussion was placed in the context of the patient's own specific clinical presentation and past medical history. All of their questions been addressed.   She has not had her gated cardiac CTA or CTA of the chest, abdomen and pelvis yet but they have been scheduled. PFT's have been scheduled. Her PFT's a year ago showed severe COPD with an FEV1 of 0.8.    Plan:  She will have her TAVR CT scans performed and then our multidisciplinary heart valve team will evaluate her for the appropriateness of TAVR.  I spent 60 minutes performing this consultation and > 50% of this time was spent face to face counseling and coordinating the care of this patient's severe aortic stenosis.    Gaye Pollack, MD 05/16/2017

## 2017-05-17 NOTE — Discharge Summary (Signed)
Advanced Heart Failure Discharge Note  Discharge Summary   Patient ID: Destiny [name] MRN: [id number], DOB/AGE: Jun 30, 1935 82 y.o. Admit date: 05/08/2017 D/C date:     05/17/2017   Primary Discharge Diagnoses:  1. Acute on chronic diastolic CHF 2. Atrial fibrillation 3. Aortic stenosis, severe 4. Pulmonary venous hypertension.  5. Mitral regurgitation 6. COPD 7. AKI    Hospital Course: Destiny [name] is a 82 year old female with a past medical history of HTN, DM, chronic atrial fibrillation on warfarin, chronic systolic CHF due to NICM, severe MR s/p failed MV clip, porcelain aorta, COPD, and severe AS.   She was seen in the heart failure clinic on 05/08/17, noted to be hypotensive and volume overloaded. She was admitted for IV diuresis with plans for TEE to further assess AS and consider possible TAVR.   TEE showed low gradient severe AS, severe eccentric MR which was similar to prior TEE likely from flail segment of the anterior leaflet, moderate to severe TR. LVEF was 55-60%. Dr. Cyndia Bent and Dr. Angelena Form were consulted for TAVR consideration. Right and left heart cath on 05/15/17 showed preserved cardiac output, no CAD. It was felt that her symptoms would improve with TAVR, but significant MR and TR would remain. She will have CT scans done outpatient in preparation for TAVR and will be seen by Dr. Roxy Manns as an outpatient. CT scans were not done in house as creatinine was 1.56 (1.26 the day prior), and the patient was adamant to return home.   PT and cardiac rehab saw her to ensure that it was safe for her to return home. She walked with a walker (350 feet) without dyspnea. Home health PT was recommended. The patient adamantly refused SNF and rehab placement. She qualified for home oxygen but refuses to wear it.   She diuresed well with IV lasix and metolazone. Weight down 13 pounds total; discharge weight was 154 pounds. Her Delene Loll was stopped with admission with normal EF and soft BP. She  was seen today by Dr. Haroldine Laws and deemed suitable for discharge. She will have close follow up in the CHF clinic.   Discharge Weight: 154 pounds.  Discharge Vitals: Blood pressure 128/73, pulse (!) 102, temperature 98.1 F (36.7 C), temperature source Oral, resp. rate (!) 21, height 5' (1.524 m), weight 154 lb 12.2 oz (70.2 kg), SpO2 98 %.  Labs: Lab Results  Component Value Date   WBC 6.0 05/17/2017   HGB 8.3 (L) 05/17/2017   HCT 28.4 (L) 05/17/2017   MCV 92.2 05/17/2017   PLT 175 05/17/2017    Recent Labs Lab 05/17/17 0735  NA 141  K 4.0  CL 95*  CO2 33*  BUN 37*  CREATININE 1.56*  CALCIUM 9.6  GLUCOSE 162*   No results found for: CHOL, HDL, LDLCALC, TRIG BNP (last 3 results)  Recent Labs  11/02/16 1228 05/08/17 1247 05/08/17 1612  BNP 324.3* 685.7* 863.4*    ProBNP (last 3 results) No results for input(s): PROBNP in the last 8760 hours.   Diagnostic Studies/Procedures   RIGHT/LEFT HEART CATH AND CORONARY ANGIOGRAPHY 05/15/17 1. Filling pressures remain elevated, R>L.  2. Preserved cardiac output.  3. Moderate pulmonary hypertension, mixed pulmonary venous and pulmonary arterial hypertension  4. No significant CAD.   RHC Procedural Findings: Hemodynamics (mmHg) RA mean 17 RV 64/16 PA 63/30, mean 44 PCWP mean 22 AO 115/58  Oxygen saturations: PA 64% AO 100%  Cardiac Output (Fick) 4.99  Cardiac Index (Fick) 2.99 PVR 4.4  WU  TEE 05/10/17 Impression:  1. Low gradient severe AS.  2. Severe, eccentric MR (similar to prior TEE), likely from flail segment of anterior leaflet.  3. Moderate to severe TR>  4. Normal LV systolic function.     Discharge Medications   Allergies as of 05/17/2017   No Known Allergies     Medication List    STOP taking these medications   carvedilol 6.25 MG tablet Commonly known as:  COREG   metFORMIN 500 MG tablet Commonly known as:  GLUCOPHAGE   sacubitril-valsartan 24-26 MG Commonly known as:  ENTRESTO     spironolactone 25 MG tablet Commonly known as:  ALDACTONE     TAKE these medications   acetaminophen 325 MG tablet Commonly known as:  TYLENOL Take 2 tablets (650 mg total) by mouth every 6 (six) hours as needed for mild pain (or Fever >/= 101).   atorvastatin 20 MG tablet Commonly known as:  LIPITOR Take 1 tablet (20 mg total) by mouth daily.   Calcium Carbonate-Vit D-Min 600-400 MG-UNIT Tabs Take 1 tablet by mouth daily.   Cinnamon 500 MG capsule Take 500 mg by mouth daily.   dorzolamide-timolol 22.3-6.8 MG/ML ophthalmic solution Commonly known as:  COSOPT Place 1 drop into both eyes 2 (two) times daily.   ferrous sulfate 325 (65 FE) MG tablet Take 325 mg by mouth daily with breakfast.   Fish Oil 1000 MG Caps Take 1 capsule by mouth daily.   gabapentin 300 MG capsule Commonly known as:  NEURONTIN Take 300 mg by mouth at bedtime.   glipiZIDE 10 MG 24 hr tablet Commonly known as:  GLUCOTROL XL Take 1 tablet (10 mg total) by mouth daily.   glucose blood test strip Commonly known as:  ACCU-CHEK AVIVA PLUS Use as instructed   linagliptin 5 MG Tabs tablet Commonly known as:  TRADJENTA Take 5 mg by mouth daily.   magnesium oxide 400 MG tablet Commonly known as:  MAG-OX Take 400 mg by mouth daily.   metoprolol succinate 25 MG 24 hr tablet Commonly known as:  TOPROL-XL Take 3 tablets (75 mg total) by mouth 2 (two) times daily.   MULTIVITAL tablet Take 1 tablet by mouth daily.   pantoprazole 40 MG tablet Commonly known as:  PROTONIX Take 1 tablet (40 mg total) by mouth daily.   potassium chloride SA 20 MEQ tablet Commonly known as:  K-DUR,KLOR-CON Take 2 tablets (40 mEq total) by mouth daily.   tiotropium 18 MCG inhalation capsule Commonly known as:  SPIRIVA HANDIHALER Place 1 capsule (18 mcg total) into inhaler and inhale daily.   torsemide 20 MG tablet Commonly known as:  DEMADEX Take 4 tablets (80 mg total) by mouth 2 (two) times daily.   vitamin E  400 UNIT capsule Take 400 Units by mouth daily.   warfarin 5 MG tablet Commonly known as:  COUMADIN Take 5 mg daily on except on Monday and Friday take 2.5 mg. What changed:  how much to take  how to take this  when to take this  additional instructions   XALATAN 0.005 % ophthalmic solution Generic drug:  latanoprost Place 1 drop into both eyes at bedtime.       Disposition   The patient will be discharged in stable condition to home. Discharge Instructions    Diet - low sodium heart healthy    Complete by:  As directed    Increase activity slowly    Complete by:  As directed  Follow-up Information    Larey Dresser, MD Follow up on 05/28/2017.   Specialty:  Cardiology Why:  at 1:40 pm for hospital follow up. Garage code [id number]. Contact information: [place] [address] [phone]        Med[facility], [facility] Follow up on 05/21/2017.   Specialty:  Family Medicine Why:  at 11:00 am for INR check        [facility], [facility] Follow up.   Why:  Home Health RN and Physical Therapy- agency will call you to arrange initial appointment Contact information: [address] [place] [address] [phone]             Duration of Discharge Encounter: Greater than 35 minutes   Signed, Arbutus Leas, NP 05/17/2017, 4:13 PM  Patient seen and examined with Jettie Booze, NP. We discussed all aspects of the encounter. I agree with the assessment and plan as stated above.   Discussed with TAVR team. [place] for discharge today, Refuses SNF. Will need close outpatient follow-up.   Glori Bickers, MD  6:03 PM

## 2017-05-17 NOTE — Progress Notes (Signed)
ANTICOAGULATION CONSULT NOTE - Follow Up Consult  Pharmacy Consult for Coumadin Indication: atrial fibrillation  No Known Allergies  Patient Measurements: Height: 5' (152.4 cm) Weight: 154 lb 12.2 oz (70.2 kg) IBW/kg (Calculated) : 45.5  Vital Signs: Temp: 98.1 F (36.7 C) (08/10 1113) Temp Source: Oral (08/10 1113) BP: 128/73 (08/10 0443) Pulse Rate: 102 (08/10 1113)  Labs:  Recent Labs  05/15/17 0240  05/15/17 0758 05/15/17 1211 05/16/17 0330 05/17/17 0253 05/17/17 0735  HGB  --   < > 8.1*  --  8.4* 8.3*  --   HCT  --   --  27.8*  --  28.5* 28.4*  --   PLT  --   --  166  --  182 175  --   LABPROT 23.6*  --   --  19.9* 18.0* 18.0*  --   INR 2.06  --   --  1.67 1.48 1.47  --   CREATININE 1.58*  --   --   --  1.28*  --  1.56*  < > = values in this interval not displayed.  Estimated Creatinine Clearance: 25.2 mL/min (A) (by C-G formula based on SCr of 1.56 mg/dL (H)).  Assessment: 30 yof on Coumadin PTA for Afib. INR 1.29 on admit. She has been off of her coumadin for a colonoscopy that was done Wednesday 7/25 and she resumed taking it 7/31. Coumadin was then held for cath on 8/8 and pharmacy to resume yesterday. She is noted s/p 2.5 mg po Vitamin K on 8/8.  -INR= 1.47 today Surprisingly not changed much from yesterday  PTA dose: 5mg  daily except 2.5mg  Mon/Fri  Goal of Therapy:  INR 2-3 Monitor platelets by anticoagulation protocol: Yes   Plan:  -Coumadin 7.5 mg PO again today, then would recommend resuming home dose as above tomorrow evening -Daily PT/INR -Monitor CBC and for signs/symptoms of bleeding  Erin Hearing PharmD., BCPS Clinical Pharmacist Pager [phone] 05/17/2017 1:04 PM

## 2017-05-17 NOTE — Discharge Instructions (Addendum)
Aortic Valve Stenosis Aortic valve stenosis is a narrowing of the aortic valve. The aortic valve opens and closes to regulate blood flow between the lower left chamber of the heart (left ventricle) and the blood vessel that leads away from the heart (aorta). When the aortic valve becomes narrow, it makes it difficult for the heart to pump blood into the aorta, which causes the heart to work harder. The extra work can weaken the heart over time. Aortic valve stenosis can range from mild to severe. If untreated, it can become more severe over time and can lead to heart failure. What are the causes? This condition may be caused by:  Buildup of calcium around and on the valve. This can occur with aging. This is the most common cause of aortic valve stenosis.  Birth defect.  Rheumatic fever.  Radiation to the chest.  What increases the risk? You may be more likely to develop this condition if:  You are over the age of 39.  You were born with an abnormal bicuspid valve.  What are the signs or symptoms? You may have no symptoms until your condition becomes severe. It may take 10-20 years for mild or moderate aortic valve stenosis to become severe. Symptoms may include:  Shortness of breath. This may get worse during physical activity.  Feeling unusually weak and tired (fatigue).  Extreme discomfort in the chest, neck, or arm (angina).  A heartbeat that is irregular or faster than normal (palpitations).  Dizziness or fainting. This may happen when you get physically tired or after you take certain heart medicines, such as nitroglycerin.  How is this diagnosed? This condition may be diagnosed with:  A physical exam.  Echocardiogram. This is a type of imaging test that uses sound waves (ultrasound) to make an image of your heart. There are two types that may be used: ? Transthoracic echocardiogram (TTE). This type of echocardiogram is noninvasive, and it is usually done  first. ? Transesophageal echocardiogram (TEE). This type of echocardiogram is done by passing a flexible tube down your esophagus. The heart and the esophagus are close to each other, so your health care provider can take very clear, detailed pictures of the heart using this type of test.  Cardiac catheterization. In this procedure, a thin, flexible tube (catheter) is passed through a large vein in your neck, groin, or arm. This procedure provides information about arteries, structures, blood pressure, and oxygen levels in your heart.  Electrocardiogram (ECG). This records the electrical impulses of your heart and assesses heart function.  Stress tests. These are tests that evaluate the blood supply to your heart and your hearts response to exercise.  Blood tests.  You may work with a health care provider who specializes in the heart (cardiologist). How is this treated? Treatment depends on how severe your condition is and what your symptoms are. You will need to have your heart checked regularly to make sure that your condition is not getting worse or causing serious problems. If your condition is mild, no treatment may be needed. Treatment may include:  Medicines that help keep your heart rate regular.  Medicines that thin your blood (anticoagulants) to prevent the formation of blood clots.  Antibiotic medicines to help prevent infection.  Surgery to replace your aortic valve. This is the most common treatment for aortic valve stenosis. Several types of surgeries are available. The surgery may be done through a large incision over your heart (open heart surgery), or it may  be done using a minimally invasive technique (transcatheter aortic valve replacement, or TAVR).  Follow these instructions at home: Lifestyle   Limit alcohol intake to no more than 1 drink per day for nonpregnant women and 2 drinks per day for men. One drink equals 12 oz of beer, 5 oz of wine, or 1 oz of hard  liquor.  Do not use any tobacco products, such as cigarettes, chewing tobacco, or e-cigarettes. If you need help quitting, ask your health care provider.  Work with your health care provider to manage your blood pressure and cholesterol.  Maintain a healthy weight. Eating and drinking  Follow instructions from your health care provider about eating or drinking restrictions. ? Limit how much caffeine you drink. Caffeine can affect your heart's rate and rhythm.  Drink enough fluid to keep your urine clear or pale yellow.  Eat a heart-healthy diet. This should include plenty of fresh fruits and vegetables. If you eat meat, it should be lean cuts. Avoid foods that are: ? High in salt, saturated fat, or sugar. ? Canned or highly processed. ? Fried. Activity  Return to your normal activities as told by your health care provider. Ask your health care provider what activities are safe for you.  Exercise regularly, as told by your health care provider. Ask your health care provider what types of exercise are safe for you.  If your aortic valve stenosis is mild, you may need to avoid only very intense physical activity. The more severe your aortic valve stenosis is, the more activities you may need to avoid. General instructions  Take over-the-counter and prescription medicines only as told by your health care provider.  If you are a woman and you plan to become pregnant, talk with your health care provider before you become pregnant.  Tell all health care providers who care for you that you have aortic valve stenosis.  Keep all follow-up visits as told by your health care provider. This is important. Contact a health care provider if:  You have a fever. Get help right away if:  You develop chest pain or tightness.  You develop shortness of breath or difficulty breathing.  You feel light-headed.  You feel like you might faint.  Your heartbeat is irregular or faster than  normal. These symptoms may represent a serious problem that is an emergency. Do not wait to see if the symptoms will go away. Get medical help right away. Call your local emergency services (911 in the [place] Do not drive yourself to the hospital. This information is not intended to replace advice given to you by your health care provider. Make sure you discuss any questions you have with your health care provider. Document Released: 06/23/2003 Document Revised: 03/01/2016 Document Reviewed: 08/28/2015 Elsevier Interactive Patient Education  2017 [place] on my medicine - Coumadin   (Warfarin)  Why was Coumadin prescribed for you? Coumadin was prescribed for you because you have a blood clot or a medical condition that can cause an increased risk of forming blood clots. Blood clots can cause serious health problems by blocking the flow of blood to the heart, lung, or brain. Coumadin can prevent harmful blood clots from forming. As a reminder your indication for Coumadin is:   Stroke Prevention Because Of Atrial Fibrillation  What test will check on my response to Coumadin? While on Coumadin (warfarin) you will need to have an INR test regularly to ensure that your dose is keeping you in the desired  range. The INR (international normalized ratio) number is calculated from the result of the laboratory test called prothrombin time (PT).  If an INR APPOINTMENT HAS NOT ALREADY BEEN MADE FOR YOU please schedule an appointment to have this lab work done by your health care provider within 7 days. Your INR goal is usually a number between:  2 to 3 or your provider may give you a more narrow range like 2-2.5.  Ask your health care provider during an office visit what your goal INR is.  What  do you need to  know  About  COUMADIN? Take Coumadin (warfarin) exactly as prescribed by your healthcare provider about the same time each day.  DO NOT stop taking without talking to the doctor who  prescribed the medication.  Stopping without other blood clot prevention medication to take the place of Coumadin may increase your risk of developing a new clot or stroke.  Get refills before you run out.  What do you do if you miss a dose? If you miss a dose, take it as soon as you remember on the same day then continue your regularly scheduled regimen the next day.  Do not take two doses of Coumadin at the same time.  Important Safety Information A possible side effect of Coumadin (Warfarin) is an increased risk of bleeding. You should call your healthcare provider right away if you experience any of the following: ? Bleeding from an injury or your nose that does not stop. ? Unusual colored urine (red or dark brown) or unusual colored stools (red or black). ? Unusual bruising for unknown reasons. ? A serious fall or if you hit your head (even if there is no bleeding).  Some foods or medicines interact with Coumadin (warfarin) and might alter your response to warfarin. To help avoid this: ? Eat a balanced diet, maintaining a consistent amount of Vitamin K. ? Notify your provider about major diet changes you plan to make. ? Avoid alcohol or limit your intake to 1 drink for women and 2 drinks for men per day. (1 drink is 5 oz. wine, 12 oz. beer, or 1.5 oz. liquor.)  Make sure that ANY health care provider who prescribes medication for you knows that you are taking Coumadin (warfarin).  Also make sure the healthcare provider who is monitoring your Coumadin knows when you have started a new medication including herbals and non-prescription products.  Coumadin (Warfarin)  Major Drug Interactions  Increased Warfarin Effect Decreased Warfarin Effect  Alcohol (large quantities) Antibiotics (esp. Septra/Bactrim, Flagyl, Cipro) Amiodarone (Cordarone) Aspirin (ASA) Cimetidine (Tagamet) Megestrol (Megace) NSAIDs (ibuprofen, naproxen, etc.) Piroxicam (Feldene) Propafenone (Rythmol  SR) Propranolol (Inderal) Isoniazid (INH) Posaconazole (Noxafil) Barbiturates (Phenobarbital) Carbamazepine (Tegretol) Chlordiazepoxide (Librium) Cholestyramine (Questran) Griseofulvin Oral Contraceptives Rifampin Sucralfate (Carafate) Vitamin K   Coumadin (Warfarin) Major Herbal Interactions  Increased Warfarin Effect Decreased Warfarin Effect  Garlic Ginseng Ginkgo biloba Coenzyme Q10 Green tea St. Johns wort    Coumadin (Warfarin) FOOD Interactions  Eat a consistent number of servings per week of foods HIGH in Vitamin K (1 serving =  cup)  Collards (cooked, or boiled & drained) Kale (cooked, or boiled & drained) Mustard greens (cooked, or boiled & drained) Parsley *serving size only =  cup Spinach (cooked, or boiled & drained) Swiss chard (cooked, or boiled & drained) Turnip greens (cooked, or boiled & drained)  Eat a consistent number of servings per week of foods MEDIUM-HIGH in Vitamin K (1 serving = 1 cup)  Asparagus (cooked,  or boiled & drained) Broccoli (cooked, boiled & drained, or raw & chopped) Brussel sprouts (cooked, or boiled & drained) *serving size only =  cup Lettuce, raw (green leaf, endive, romaine) Spinach, raw Turnip greens, raw & chopped   These websites have more information on Coumadin (warfarin):  FailFactory.se; VeganReport.com.au;

## 2017-05-19 DIAGNOSIS — I080 Rheumatic disorders of both mitral and aortic valves: Secondary | ICD-10-CM | POA: Diagnosis not present

## 2017-05-19 DIAGNOSIS — I110 Hypertensive heart disease with heart failure: Secondary | ICD-10-CM | POA: Diagnosis not present

## 2017-05-19 DIAGNOSIS — I482 Chronic atrial fibrillation: Secondary | ICD-10-CM | POA: Diagnosis not present

## 2017-05-19 DIAGNOSIS — I5033 Acute on chronic diastolic (congestive) heart failure: Secondary | ICD-10-CM | POA: Diagnosis not present

## 2017-05-21 ENCOUNTER — Other Ambulatory Visit: Payer: Self-pay | Admitting: *Deleted

## 2017-05-21 DIAGNOSIS — I350 Nonrheumatic aortic (valve) stenosis: Secondary | ICD-10-CM

## 2017-05-22 ENCOUNTER — Telehealth (HOSPITAL_COMMUNITY): Payer: Self-pay | Admitting: Pharmacist

## 2017-05-22 NOTE — Telephone Encounter (Signed)
Metoprolol succinate 25 mg (#3 tablets BID) PA approved by [employer] Part D through 05/22/18.   [name]. Velva Harman, PharmD, BCPS, CPP Clinical Pharmacist Pager: [phone] Phone: [phone] 05/22/2017 11:58 AM

## 2017-05-28 ENCOUNTER — Encounter (HOSPITAL_COMMUNITY): Payer: Medicare HMO | Admitting: Cardiology

## 2017-05-29 ENCOUNTER — Ambulatory Visit (HOSPITAL_COMMUNITY)
Admission: RE | Admit: 2017-05-29 | Discharge: 2017-05-29 | Disposition: A | Discharge status: Home / Self Care | Payer: Medicare HMO | Source: Ambulatory Visit | Attending: Cardiovascular Disease | Admitting: Cardiovascular Disease

## 2017-05-29 ENCOUNTER — Ambulatory Visit (HOSPITAL_COMMUNITY): Payer: Medicare HMO

## 2017-05-29 DIAGNOSIS — I350 Nonrheumatic aortic (valve) stenosis: Secondary | ICD-10-CM | POA: Insufficient documentation

## 2017-05-29 DIAGNOSIS — K449 Diaphragmatic hernia without obstruction or gangrene: Secondary | ICD-10-CM | POA: Insufficient documentation

## 2017-05-29 MED ORDER — IOPAMIDOL (ISOVUE-370) INJECTION 76%
INTRAVENOUS | Status: AC
  Filled 2017-05-29: qty 50

## 2017-05-29 MED ORDER — DEXTROSE 5 % IV SOLN
INTRAVENOUS | Status: DC
  Filled 2017-05-29 (×2): qty 500

## 2017-05-29 MED ORDER — SODIUM BICARBONATE BOLUS VIA INFUSION
INTRAVENOUS | Status: DC
  Filled 2017-05-29: qty 1

## 2017-05-29 MED ORDER — IOPAMIDOL (ISOVUE-370) INJECTION 76%
INTRAVENOUS | Status: AC
  Filled 2017-05-29: qty 100

## 2017-05-31 ENCOUNTER — Inpatient Hospital Stay (HOSPITAL_COMMUNITY): Payer: Medicare HMO

## 2017-05-31 ENCOUNTER — Emergency Department (HOSPITAL_COMMUNITY): Payer: Medicare HMO

## 2017-05-31 ENCOUNTER — Inpatient Hospital Stay (HOSPITAL_COMMUNITY)
Admission: EM | Admit: 2017-05-31 | Discharge: 2017-06-08 | DRG: 291 | Disposition: E | Payer: Medicare HMO | Attending: Pulmonary Disease | Admitting: Pulmonary Disease

## 2017-05-31 ENCOUNTER — Encounter (HOSPITAL_COMMUNITY): Payer: Self-pay | Admitting: Emergency Medicine

## 2017-05-31 DIAGNOSIS — E872 Acidosis: Secondary | ICD-10-CM | POA: Diagnosis present

## 2017-05-31 DIAGNOSIS — H409 Unspecified glaucoma: Secondary | ICD-10-CM | POA: Diagnosis present

## 2017-05-31 DIAGNOSIS — Z96651 Presence of right artificial knee joint: Secondary | ICD-10-CM | POA: Diagnosis present

## 2017-05-31 DIAGNOSIS — I130 Hypertensive heart and chronic kidney disease with heart failure and stage 1 through stage 4 chronic kidney disease, or unspecified chronic kidney disease: Secondary | ICD-10-CM | POA: Diagnosis present

## 2017-05-31 DIAGNOSIS — E1122 Type 2 diabetes mellitus with diabetic chronic kidney disease: Secondary | ICD-10-CM | POA: Diagnosis present

## 2017-05-31 DIAGNOSIS — J9601 Acute respiratory failure with hypoxia: Secondary | ICD-10-CM | POA: Diagnosis present

## 2017-05-31 DIAGNOSIS — Z7901 Long term (current) use of anticoagulants: Secondary | ICD-10-CM | POA: Diagnosis not present

## 2017-05-31 DIAGNOSIS — E11649 Type 2 diabetes mellitus with hypoglycemia without coma: Secondary | ICD-10-CM | POA: Diagnosis present

## 2017-05-31 DIAGNOSIS — N170 Acute kidney failure with tubular necrosis: Secondary | ICD-10-CM | POA: Diagnosis present

## 2017-05-31 DIAGNOSIS — I5033 Acute on chronic diastolic (congestive) heart failure: Secondary | ICD-10-CM | POA: Diagnosis not present

## 2017-05-31 DIAGNOSIS — Z7984 Long term (current) use of oral hypoglycemic drugs: Secondary | ICD-10-CM

## 2017-05-31 DIAGNOSIS — I350 Nonrheumatic aortic (valve) stenosis: Secondary | ICD-10-CM | POA: Diagnosis not present

## 2017-05-31 DIAGNOSIS — R570 Cardiogenic shock: Secondary | ICD-10-CM | POA: Diagnosis not present

## 2017-05-31 DIAGNOSIS — I340 Nonrheumatic mitral (valve) insufficiency: Secondary | ICD-10-CM | POA: Diagnosis not present

## 2017-05-31 DIAGNOSIS — I429 Cardiomyopathy, unspecified: Secondary | ICD-10-CM | POA: Diagnosis present

## 2017-05-31 DIAGNOSIS — N179 Acute kidney failure, unspecified: Secondary | ICD-10-CM | POA: Diagnosis not present

## 2017-05-31 DIAGNOSIS — D638 Anemia in other chronic diseases classified elsewhere: Secondary | ICD-10-CM | POA: Diagnosis present

## 2017-05-31 DIAGNOSIS — E7800 Pure hypercholesterolemia, unspecified: Secondary | ICD-10-CM | POA: Diagnosis present

## 2017-05-31 DIAGNOSIS — E875 Hyperkalemia: Secondary | ICD-10-CM | POA: Insufficient documentation

## 2017-05-31 DIAGNOSIS — Z90710 Acquired absence of both cervix and uterus: Secondary | ICD-10-CM

## 2017-05-31 DIAGNOSIS — G8929 Other chronic pain: Secondary | ICD-10-CM | POA: Diagnosis present

## 2017-05-31 DIAGNOSIS — J9602 Acute respiratory failure with hypercapnia: Secondary | ICD-10-CM | POA: Diagnosis present

## 2017-05-31 DIAGNOSIS — Z85828 Personal history of other malignant neoplasm of skin: Secondary | ICD-10-CM

## 2017-05-31 DIAGNOSIS — Z87891 Personal history of nicotine dependence: Secondary | ICD-10-CM

## 2017-05-31 DIAGNOSIS — Z66 Do not resuscitate: Secondary | ICD-10-CM | POA: Diagnosis present

## 2017-05-31 DIAGNOSIS — I482 Chronic atrial fibrillation: Secondary | ICD-10-CM | POA: Diagnosis present

## 2017-05-31 DIAGNOSIS — N183 Chronic kidney disease, stage 3 (moderate): Secondary | ICD-10-CM | POA: Diagnosis present

## 2017-05-31 DIAGNOSIS — Z515 Encounter for palliative care: Secondary | ICD-10-CM | POA: Diagnosis present

## 2017-05-31 DIAGNOSIS — I5043 Acute on chronic combined systolic (congestive) and diastolic (congestive) heart failure: Secondary | ICD-10-CM | POA: Diagnosis present

## 2017-05-31 DIAGNOSIS — J449 Chronic obstructive pulmonary disease, unspecified: Secondary | ICD-10-CM | POA: Diagnosis present

## 2017-05-31 DIAGNOSIS — I959 Hypotension, unspecified: Secondary | ICD-10-CM

## 2017-05-31 DIAGNOSIS — I272 Pulmonary hypertension, unspecified: Secondary | ICD-10-CM | POA: Diagnosis present

## 2017-05-31 DIAGNOSIS — D6489 Other specified anemias: Secondary | ICD-10-CM | POA: Diagnosis present

## 2017-05-31 DIAGNOSIS — E162 Hypoglycemia, unspecified: Secondary | ICD-10-CM

## 2017-05-31 DIAGNOSIS — R0602 Shortness of breath: Secondary | ICD-10-CM | POA: Diagnosis present

## 2017-05-31 LAB — I-STAT CG4 LACTIC ACID, ED
LACTIC ACID, VENOUS: 2.2 mmol/L — AB (ref 0.5–1.9)
LACTIC ACID, VENOUS: 2.65 mmol/L — AB (ref 0.5–1.9)

## 2017-05-31 LAB — I-STAT CHEM 8, ED
BUN: 69 mg/dL — AB (ref 6–20)
CALCIUM ION: 1.2 mmol/L (ref 1.15–1.40)
CHLORIDE: 100 mmol/L — AB (ref 101–111)
Creatinine, Ser: 3.9 mg/dL — ABNORMAL HIGH (ref 0.44–1.00)
GLUCOSE: 45 mg/dL — AB (ref 65–99)
HCT: 31 % — ABNORMAL LOW (ref 36.0–46.0)
Hemoglobin: 10.5 g/dL — ABNORMAL LOW (ref 12.0–15.0)
POTASSIUM: 6.8 mmol/L — AB (ref 3.5–5.1)
Sodium: 137 mmol/L (ref 135–145)
TCO2: 30 mmol/L (ref 22–32)

## 2017-05-31 LAB — CBC WITH DIFFERENTIAL/PLATELET
BASOS PCT: 0 %
Basophils Absolute: 0 10*3/uL (ref 0.0–0.1)
Eosinophils Absolute: 0.2 10*3/uL (ref 0.0–0.7)
Eosinophils Relative: 3 %
HEMATOCRIT: 31.7 % — AB (ref 36.0–46.0)
Hemoglobin: 9.5 g/dL — ABNORMAL LOW (ref 12.0–15.0)
LYMPHS ABS: 1 10*3/uL (ref 0.7–4.0)
LYMPHS PCT: 12 %
MCH: 28.4 pg (ref 26.0–34.0)
MCHC: 30 g/dL (ref 30.0–36.0)
MCV: 94.6 fL (ref 78.0–100.0)
MONO ABS: 0.8 10*3/uL (ref 0.1–1.0)
MONOS PCT: 10 %
NEUTROS ABS: 6.1 10*3/uL (ref 1.7–7.7)
Neutrophils Relative %: 75 %
Platelets: 228 10*3/uL (ref 150–400)
RBC: 3.35 MIL/uL — ABNORMAL LOW (ref 3.87–5.11)
RDW: 18.9 % — AB (ref 11.5–15.5)
WBC: 8.1 10*3/uL (ref 4.0–10.5)

## 2017-05-31 LAB — BASIC METABOLIC PANEL
ANION GAP: 9 (ref 5–15)
BUN: 75 mg/dL — ABNORMAL HIGH (ref 6–20)
CALCIUM: 9.5 mg/dL (ref 8.9–10.3)
CO2: 29 mmol/L (ref 22–32)
Chloride: 101 mmol/L (ref 101–111)
Creatinine, Ser: 3.96 mg/dL — ABNORMAL HIGH (ref 0.44–1.00)
GFR, EST AFRICAN AMERICAN: 11 mL/min — AB (ref >60)
GFR, EST NON AFRICAN AMERICAN: 10 mL/min — AB (ref >60)
Glucose, Bld: 118 mg/dL — ABNORMAL HIGH (ref 65–99)
Potassium: 6.9 mmol/L (ref 3.5–5.1)
SODIUM: 139 mmol/L (ref 135–145)

## 2017-05-31 LAB — COMPREHENSIVE METABOLIC PANEL
ALBUMIN: 3.3 g/dL — AB (ref 3.5–5.0)
ALT: 12 U/L — ABNORMAL LOW (ref 14–54)
ANION GAP: 7 (ref 5–15)
AST: 21 U/L (ref 15–41)
Alkaline Phosphatase: 48 U/L (ref 38–126)
BILIRUBIN TOTAL: 0.6 mg/dL (ref 0.3–1.2)
BUN: 73 mg/dL — ABNORMAL HIGH (ref 6–20)
CO2: 31 mmol/L (ref 22–32)
Calcium: 9.3 mg/dL (ref 8.9–10.3)
Chloride: 100 mmol/L — ABNORMAL LOW (ref 101–111)
Creatinine, Ser: 3.81 mg/dL — ABNORMAL HIGH (ref 0.44–1.00)
GFR, EST AFRICAN AMERICAN: 12 mL/min — AB (ref >60)
GFR, EST NON AFRICAN AMERICAN: 10 mL/min — AB (ref >60)
GLUCOSE: 51 mg/dL — AB (ref 65–99)
POTASSIUM: 6.9 mmol/L — AB (ref 3.5–5.1)
Sodium: 138 mmol/L (ref 135–145)
TOTAL PROTEIN: 7.2 g/dL (ref 6.5–8.1)

## 2017-05-31 LAB — I-STAT TROPONIN, ED: Troponin i, poc: 0.05 ng/mL (ref 0.00–0.08)

## 2017-05-31 LAB — CBG MONITORING, ED
GLUCOSE-CAPILLARY: 53 mg/dL — AB (ref 65–99)
Glucose-Capillary: 39 mg/dL — CL (ref 65–99)
Glucose-Capillary: 43 mg/dL — CL (ref 65–99)
Glucose-Capillary: 142 mg/dL — ABNORMAL HIGH (ref 65–99)
Glucose-Capillary: 160 mg/dL — ABNORMAL HIGH (ref 65–99)
Glucose-Capillary: 93 mg/dL (ref 65–99)
Glucose-Capillary: 115 mg/dL — ABNORMAL HIGH (ref 65–99)

## 2017-05-31 LAB — PROTIME-INR
INR: 2.56
PROTHROMBIN TIME: 28 s — AB (ref 11.4–15.2)

## 2017-05-31 LAB — GLUCOSE, CAPILLARY: GLUCOSE-CAPILLARY: 79 mg/dL (ref 65–99)

## 2017-05-31 LAB — BRAIN NATRIURETIC PEPTIDE: B NATRIURETIC PEPTIDE 5: 544 pg/mL — AB (ref 0.0–100.0)

## 2017-05-31 MED ORDER — DEXTROSE 50 % IV SOLN
2.0000 | Freq: Once | INTRAVENOUS | Status: AC
  Administered 2017-05-31: 100 mL via INTRAVENOUS
  Filled 2017-05-31: qty 100

## 2017-05-31 MED ORDER — ENOXAPARIN SODIUM 30 MG/0.3ML ~~LOC~~ SOLN: 30.0000 mg | SUBCUTANEOUS | Status: DC

## 2017-05-31 MED ORDER — SODIUM CHLORIDE 0.9 % IV SOLN
1.0000 g | Freq: Once | INTRAVENOUS | Status: AC
  Administered 2017-05-31: 1 g via INTRAVENOUS
  Filled 2017-05-31: qty 10

## 2017-05-31 MED ORDER — ALBUTEROL SULFATE (2.5 MG/3ML) 0.083% IN NEBU
2.5000 mg | INHALATION_SOLUTION | RESPIRATORY_TRACT | Status: DC
  Administered 2017-05-31: 2.5 mg via RESPIRATORY_TRACT
  Filled 2017-05-31: qty 3

## 2017-05-31 MED ORDER — HEPARIN SODIUM (PORCINE) 1000 UNIT/ML IJ SOLN
1000.0000 [IU] | INTRAMUSCULAR | Status: DC | PRN
  Filled 2017-05-31: qty 6

## 2017-05-31 MED ORDER — INSULIN ASPART 100 UNIT/ML ~~LOC~~ SOLN
10.0000 [IU] | Freq: Once | SUBCUTANEOUS | Status: AC
  Administered 2017-05-31: 10 [IU] via INTRAVENOUS
  Filled 2017-05-31: qty 1

## 2017-05-31 MED ORDER — HEPARIN SODIUM (PORCINE) 5000 UNIT/ML IJ SOLN
5000.0000 [IU] | Freq: Two times a day (BID) | INTRAMUSCULAR | Status: DC
  Filled 2017-05-31: qty 1

## 2017-05-31 MED ORDER — INSULIN ASPART 100 UNIT/ML ~~LOC~~ SOLN
5.0000 [IU] | Freq: Once | SUBCUTANEOUS | Status: AC
  Administered 2017-05-31: 5 [IU] via INTRAVENOUS

## 2017-05-31 MED ORDER — INSULIN ASPART 100 UNIT/ML ~~LOC~~ SOLN
5.0000 [IU] | Freq: Once | SUBCUTANEOUS | Status: DC
  Filled 2017-05-31: qty 1

## 2017-05-31 MED ORDER — SODIUM POLYSTYRENE SULFONATE 15 GM/60ML PO SUSP
30.0000 g | ORAL | Status: DC
Start: 2017-05-31 — End: 2017-06-01
  Administered 2017-05-31 – 2017-06-01 (×2): 30 g via ORAL
  Filled 2017-05-31 (×4): qty 120

## 2017-05-31 MED ORDER — WARFARIN SODIUM 2.5 MG PO TABS
2.5000 mg | ORAL_TABLET | Freq: Once | ORAL | Status: DC
  Filled 2017-05-31: qty 1

## 2017-05-31 MED ORDER — PRISMASOL BGK 0/2.5 32-2.5 MEQ/L IV SOLN
INTRAVENOUS | Status: DC
  Administered 2017-06-01 (×2): via INTRAVENOUS_CENTRAL
  Filled 2017-05-31 (×12): qty 5000

## 2017-05-31 MED ORDER — SODIUM CHLORIDE 0.9 % IV BOLUS (SEPSIS)
250.0000 mL | Freq: Once | INTRAVENOUS | Status: AC
  Administered 2017-05-31: 250 mL via INTRAVENOUS

## 2017-05-31 MED ORDER — SODIUM POLYSTYRENE SULFONATE 15 GM/60ML PO SUSP: 30.0000 g | Freq: Once | ORAL | Status: DC

## 2017-05-31 MED ORDER — PRISMASOL BGK 0/2.5 32-2.5 MEQ/L IV SOLN
INTRAVENOUS | Status: DC
  Administered 2017-06-01: 05:00:00 via INTRAVENOUS_CENTRAL
  Filled 2017-05-31 (×5): qty 5000

## 2017-05-31 MED ORDER — PRISMASOL BGK 0/2.5 32-2.5 MEQ/L IV SOLN
INTRAVENOUS | Status: DC
  Administered 2017-06-01: 05:00:00 via INTRAVENOUS_CENTRAL
  Filled 2017-05-31 (×6): qty 5000

## 2017-05-31 MED ORDER — ONDANSETRON HCL 4 MG/2ML IJ SOLN
4.0000 mg | Freq: Four times a day (QID) | INTRAMUSCULAR | Status: DC | PRN
  Administered 2017-05-31 – 2017-06-01 (×2): 4 mg via INTRAVENOUS
  Filled 2017-05-31 (×2): qty 2

## 2017-05-31 MED ORDER — DEXTROSE 50 % IV SOLN
1.0000 | Freq: Once | INTRAVENOUS | Status: AC
  Administered 2017-05-31: 50 mL via INTRAVENOUS
  Filled 2017-05-31: qty 50

## 2017-05-31 MED ORDER — ALBUTEROL SULFATE (2.5 MG/3ML) 0.083% IN NEBU
2.5000 mg | INHALATION_SOLUTION | Freq: Three times a day (TID) | RESPIRATORY_TRACT | Status: DC
  Filled 2017-05-31: qty 3

## 2017-05-31 MED ORDER — WARFARIN - PHARMACIST DOSING INPATIENT: Freq: Every day | Status: DC

## 2017-05-31 MED ORDER — SODIUM CHLORIDE 0.9 % FOR CRRT
INTRAVENOUS_CENTRAL | Status: DC | PRN
Start: 2017-05-31 — End: 2017-06-01
  Filled 2017-05-31: qty 1000

## 2017-05-31 MED ORDER — ONDANSETRON HCL 4 MG/2ML IJ SOLN
INTRAMUSCULAR | Status: AC
  Administered 2017-05-31: 4 mg via INTRAVENOUS
  Filled 2017-05-31: qty 2

## 2017-05-31 MED ORDER — INSULIN ASPART 100 UNIT/ML ~~LOC~~ SOLN: 5.0000 [IU] | Freq: Once | SUBCUTANEOUS | Status: DC

## 2017-05-31 MED ORDER — NOREPINEPHRINE BITARTRATE 1 MG/ML IV SOLN
0.0000 ug/min | INTRAVENOUS | Status: DC
  Administered 2017-05-31: 2 ug/min via INTRAVENOUS
  Filled 2017-05-31: qty 4

## 2017-05-31 NOTE — ED Provider Notes (Signed)
[place] DEPT Provider Note   CSN: [id number] Arrival date & time: 05/30/2017  1157     History   Chief Complaint Chief Complaint  Patient presents with  . Hypotension    HPI Destiny [name] is a 82 y.o. female.  HPI   82 year old female with a history of hypertension, diabetes, chronic atrial fibrillation on Coumadin, chronic systolic heart failure due to nonischemic cardiac myopathy, severe mitral regurg status post failed mitral valve clip, porcelain aorta, COPD, severe AS, recent admission for hypotension/fluid overload with ongoing assessment for possible TAVR, presents with concern for hypotension.  Patient was in hospital 8/1-8/10 for hypotension and CHF and reports having 13lb of fluid taken off. She has been undergoing outpatient work up for possible TAVR, and had CT done on Wed with IV contrast. She received significant amount of fluid per friend and pt given her elevated Cr   She reports her weight was 159 (from 154 at discharge) yesterday.  She and her friend report her leg swelling has significantly improved from when she was in the hospital and she reports her breathing has improved too. Reports mild dyspnea but that it is better than what it was.  Denies fevers, congestion, urinary symptoms, numbness/weakness, abdominal pain, chest pain, cough.  Reports she is suspicious that the new medication she began prior to discharge (?metolazone) may be contributing to her symptoms. Reports feeling fatigued. She had one episode of diarrhea today. Her home health care nurse had come to take blood work but found her blood pressure to be 85I systolic and was sent to the ED.  Past Medical History:  Diagnosis Date  . Anemia   . Aortic calcification (HCC) 02/20/2016   Involving entire thoracic and abdominal aorta  . Arthritis   . Atrial fibrillation ([place]) 1999-diagnosed  . Complication of anesthesia    slow to awaken (05/08/2017)  . Diastolic CHF, chronic ([facility]) 12/30/2014  . Edema     . Glaucoma, both eyes   . History of blood transfusion 03/2017   "related to anemia"  . History of kidney stones   . Incidental pulmonary nodule    Results of CT scan:  Lungs/Pleura: There is some progressive scarring and reticulonodular opacity in the left upper lobe and lingula extending to the anterior pleural surface. There is some associated mild traction bronchiectasis in the lingula. Findings are suggestive of postinflammatory/postinfectious changes. Recommend correlation with any active infectious symptoms. CT follow-up would be appro  . Mitral valve insufficiency and aortic valve insufficiency   . Mononeuritis of unspecified site   . Osteoarthrosis, unspecified whether generalized or localized, unspecified site    "all over" (05/08/2017)  . Pure hypercholesterolemia   . Skin cancer    left temple  . Type II diabetes mellitus ([place])   . Unspecified essential hypertension     Patient Active Problem List   Diagnosis Date Noted  . Acute renal failure (ARF) ([place]) 05/13/2017  . Acute on chronic diastolic (congestive) heart failure ([place])   . Hyperkalemia   . Severe aortic stenosis   . Severe mitral insufficiency   . Cardiogenic shock ([place])   . Acute on chronic diastolic heart failure ([place]) 05/08/2017  . Upper GI bleed 01/02/2017  . Hypotension 01/02/2017  . Normocytic anemia 11/23/2016  . Aortic calcification ([place]) 02/20/2016  . Incidental pulmonary nodule 02/02/2016  . Pulmonary hypertension ([place]) 01/02/2016  . Chronic combined systolic and diastolic congestive heart failure ([place]) 10/25/2015  . Aortic stenosis 10/25/2015  . Chronic anticoagulation 10/24/2015  .  Diabetes mellitus type 2, controlled ([place]) 07/13/2014  . Skin lesion 07/02/2014  . Carotid bruit 11/03/2013  . CARDIAC MURMUR 06/21/2009  . HYPERCHOLESTEROLEMIA 06/20/2009  . NEUROPATHY 06/20/2009  . GLAUCOMA 06/20/2009  . MITRAL INSUFFICIENCY 06/20/2009  . Essential hypertension 06/20/2009  . Atrial fibrillation  ([place]) 06/20/2009  . OSTEOARTHRITIS 06/20/2009    Past Surgical History:  Procedure Laterality Date  . ABDOMINAL HYSTERECTOMY  2000   partial  . APPENDECTOMY  1954  . CARDIAC CATHETERIZATION N/A 11/16/2015   Procedure: Right/Left Heart Cath and Coronary Angiography;  Surgeon: Larey Dresser, MD;  Location: [facility];  Service: Cardiovascular;  Laterality: N/A;  . CATARACT EXTRACTION W/ INTRAOCULAR LENS IMPLANT Left   . CYSTOSCOPY W/ RETROGRADES Right 11/26/2012   Procedure: CYSTOSCOPY WITH RETROGRADE PYELOGRAM;  Surgeon: Alexis Frock, MD;  Location: [facility];  Service: Urology;  Laterality: Right;  . ESOPHAGOGASTRODUODENOSCOPY (EGD) WITH PROPOFOL N/A 01/03/2017   Procedure: ESOPHAGOGASTRODUODENOSCOPY (EGD) WITH PROPOFOL;  Surgeon: Clarene Essex, MD;  Location: [facility] ENDOSCOPY;  Service: Endoscopy;  Laterality: N/A;  . HOLMIUM LASER APPLICATION Right 6/96/2952   Procedure: HOLMIUM LASER APPLICATION;  Surgeon: Alexis Frock, MD;  Location[facility];  Service: Urology;  Laterality: Right;  . RIGHT/LEFT HEART CATH AND CORONARY ANGIOGRAPHY N/A 05/15/2017   Procedure: RIGHT/LEFT HEART CATH AND CORONARY ANGIOGRAPHY;  Surgeon: Larey Dresser, MD;  Location: [place] [facility];  Service: Cardiovascular;  Laterality: N/A;  . SKIN CANCER EXCISION Left    temple  . TEE WITHOUT CARDIOVERSION N/A 12/08/2015   Procedure: TRANSESOPHAGEAL ECHOCARDIOGRAM (TEE);  Surgeon: Larey Dresser, MD;  Location: [facility];  Service: Cardiovascular;  Laterality: N/A;  . TEE WITHOUT CARDIOVERSION N/A 05/10/2017   Procedure: TRANSESOPHAGEAL ECHOCARDIOGRAM (TEE);  Surgeon: Larey Dresser, MD;  Location: [facility] ENDOSCOPY;  Service: Cardiovascular;  Laterality: N/A;  . TONSILLECTOMY  1943  . TOTAL KNEE ARTHROPLASTY Right 2009  . TRANSESOPHAGEAL ECHOCARDIOGRAM  02/2012    OB History    No data available       Home Medications    Prior to Admission medications   Medication Sig  Start Date End Date Taking? Authorizing Provider  acetaminophen (TYLENOL) 325 MG tablet Take 2 tablets (650 mg total) by mouth every 6 (six) hours as needed for mild pain (or Fever >/= 101). 01/03/17  Yes Regalado, Belkys A, MD  atorvastatin (LIPITOR) 20 MG tablet Take 1 tablet (20 mg total) by mouth daily. 02/18/17  Yes Larey Dresser, MD  Calcium Carbonate-Vit D-Min 600-400 MG-UNIT TABS Take 1 tablet by mouth daily.    Yes [provider]  Cinnamon 500 MG capsule Take 500 mg by mouth daily.     Yes [provider]  dorzolamide-timolol (COSOPT) 22.3-6.8 MG/ML ophthalmic solution Place 1 drop into both eyes 2 (two) times daily.    Yes [provider]  ferrous sulfate 325 (65 FE) MG tablet Take 325 mg by mouth daily with breakfast.   Yes [provider]  gabapentin (NEURONTIN) 300 MG capsule Take 300 mg by mouth at bedtime.    Yes [provider]  glipiZIDE (GLUCOTROL XL) 10 MG 24 hr tablet Take 1 tablet (10 mg total) by mouth daily. 10/24/15  Yes Kuneff, Renee A, DO  glucose blood (ACCU-CHEK AVIVA PLUS) test strip Use as instructed 10/28/15  Yes Kuneff, Renee A, DO  latanoprost (XALATAN) 0.005 % ophthalmic solution Place 1 drop into both eyes at bedtime.    Yes [provider]  linagliptin (TRADJENTA) 5 MG TABS tablet Take 5 mg by mouth daily.   Yes [provider]  magnesium oxide (MAG-OX) 400 MG tablet Take 400 mg by mouth daily.   Yes [provider]  metoprolol succinate (TOPROL-XL) 25 MG 24 hr tablet Take 3 tablets (75 mg total) by mouth 2 (two) times daily. 05/17/17  Yes Arbutus Leas, NP  Multiple Vitamins-Minerals (MULTIVITAL) tablet Take 1 tablet by mouth daily.     Yes [provider]  Omega-3 Fatty Acids (FISH OIL) 1000 MG CAPS Take 1 capsule by mouth daily.    Yes [provider]  pantoprazole (PROTONIX) 40 MG tablet Take 1 tablet (40 mg total) by mouth daily. 01/03/17  Yes Regalado, Belkys A, MD    potassium chloride SA (K-DUR,KLOR-CON) 20 MEQ tablet Take 2 tablets (40 mEq total) by mouth daily. 05/18/17  Yes Arbutus Leas, NP  tiotropium (SPIRIVA HANDIHALER) 18 MCG inhalation capsule Place 1 capsule (18 mcg total) into inhaler and inhale daily. 03/01/16  Yes Larey Dresser, MD  torsemide (DEMADEX) 20 MG tablet Take 4 tablets (80 mg total) by mouth 2 (two) times daily. 05/02/17  Yes Larey Dresser, MD  vitamin E 400 UNIT capsule Take 400 Units by mouth daily.     Yes [provider]  warfarin (COUMADIN) 5 MG tablet Take 5 mg daily on except on Monday and Friday take 2.5 mg. 05/17/17  Yes Arbutus Leas, NP    Family History Family History  Problem Relation Age of Onset  . Cancer Mother 73       leukemia   . Breast cancer Neg Hx   . Colon cancer Neg Hx     Social History Social History  Substance Use Topics  . Smoking status: Former Smoker    Packs/day: 3.50    Years: 34.00    Types: Cigarettes    Quit date: 07/11/1988  . Smokeless tobacco: Never Used  . Alcohol use Yes     Comment: 05/08/2017 "none in ages"     Allergies   Patient has no known allergies.   Review of Systems Review of Systems  Constitutional: Positive for fatigue. Negative for fever.  HENT: Negative for sore throat.   Eyes: Negative for visual disturbance.  Respiratory: Positive for shortness of breath (reports mild, improved from prior). Negative for cough.   Cardiovascular: Negative for chest pain. Leg swelling: reports improved from prior.  Gastrointestinal: Positive for diarrhea (1 episode today). Negative for abdominal pain, nausea and vomiting.  Genitourinary: Negative for difficulty urinating and dysuria.  Musculoskeletal: Negative for back pain and neck pain.  Skin: Negative for rash.  Neurological: Negative for syncope and headaches.     Physical Exam Updated Vital Signs BP (!) 101/57 (BP Location: Right Arm)   Pulse 99   Temp 98 F (36.7 C) (Oral)   Resp 16   Ht 5' (1.524  m)   Wt 69.9 kg (154 lb)   SpO2 96%   BMI 30.08 kg/m   Physical Exam  Constitutional: She is oriented to person, place, and time. She appears well-developed and well-nourished. She has a sickly appearance. She appears ill. No distress.  HENT:  Head: Normocephalic and atraumatic.  Eyes: Conjunctivae and EOM are normal.  Neck: Normal range of motion.  Cardiovascular: Normal rate, regular rhythm and intact distal pulses.  Exam reveals no gallop and no friction rub.   Murmur heard. Pulmonary/Chest: Effort normal and breath sounds normal. No respiratory distress. She  has no wheezes. She has no rales.  Abdominal: Soft. She exhibits no distension. There is no tenderness. There is no guarding.  Musculoskeletal: She exhibits edema (2+ bilateral). She exhibits no tenderness.  Neurological: She is alert and oriented to person, place, and time.  Skin: Skin is warm and dry. No rash noted. She is not diaphoretic. No erythema.  Nursing note and vitals reviewed.    ED Treatments / Results  Labs (all labs ordered are listed, but only abnormal results are displayed) Labs Reviewed  CBC WITH DIFFERENTIAL/PLATELET - Abnormal; Notable for the following:       Result Value   RBC 3.35 (*)    Hemoglobin 9.5 (*)    HCT 31.7 (*)    RDW 18.9 (*)    All other components within normal limits  COMPREHENSIVE METABOLIC PANEL - Abnormal; Notable for the following:    Potassium 6.9 (*)    Chloride 100 (*)    Glucose, Bld 51 (*)    BUN 73 (*)    Creatinine, Ser 3.81 (*)    Albumin 3.3 (*)    ALT 12 (*)    GFR calc non Af Amer 10 (*)    GFR calc Af Amer 12 (*)    All other components within normal limits  BRAIN NATRIURETIC PEPTIDE - Abnormal; Notable for the following:    B Natriuretic Peptide 544.0 (*)    All other components within normal limits  PROTIME-INR - Abnormal; Notable for the following:    Prothrombin Time 28.0 (*)    All other components within normal limits  BASIC METABOLIC PANEL -  Abnormal; Notable for the following:    Potassium 6.9 (*)    Glucose, Bld 118 (*)    BUN 75 (*)    Creatinine, Ser 3.96 (*)    GFR calc non Af Amer 10 (*)    GFR calc Af Amer 11 (*)    All other components within normal limits  BLOOD GAS, VENOUS - Abnormal; Notable for the following:    pCO2, Ven 61.6 (*)    All other components within normal limits  CBG MONITORING, ED - Abnormal; Notable for the following:    Glucose-Capillary 39 (*)    All other components within normal limits  I-STAT CG4 LACTIC ACID, ED - Abnormal; Notable for the following:    Lactic Acid, Venous 2.20 (*)    All other components within normal limits  I-STAT CHEM 8, ED - Abnormal; Notable for the following:    Potassium 6.8 (*)    Chloride 100 (*)    BUN 69 (*)    Creatinine, Ser 3.90 (*)    Glucose, Bld 45 (*)    Hemoglobin 10.5 (*)    HCT 31.0 (*)    All other components within normal limits  CBG MONITORING, ED - Abnormal; Notable for the following:    Glucose-Capillary 43 (*)    All other components within normal limits  CBG MONITORING, ED - Abnormal; Notable for the following:    Glucose-Capillary 142 (*)    All other components within normal limits  CBG MONITORING, ED - Abnormal; Notable for the following:    Glucose-Capillary 160 (*)    All other components within normal limits  I-STAT CG4 LACTIC ACID, ED - Abnormal; Notable for the following:    Lactic Acid, Venous 2.65 (*)    All other components within normal limits  CBG MONITORING, ED - Abnormal; Notable for the following:    Glucose-Capillary 53 (*)  All other components within normal limits  URINALYSIS, ROUTINE W REFLEX MICROSCOPIC  CBC  BASIC METABOLIC PANEL  PROTIME-INR  COMPREHENSIVE METABOLIC PANEL  PHOSPHORUS  IRON AND TIBC  PARATHYROID HORMONE, INTACT (NO CA)  HEPATITIS B SURFACE ANTIBODY  HEPATITIS B SURFACE ANTIGEN  HEPATITIS B CORE ANTIBODY, IGM  HEPATITIS C ANTIBODY (REFLEX)  RENAL FUNCTION PANEL  MAGNESIUM  I-STAT  TROPONIN, ED  I-STAT VENOUS BLOOD GAS, ED  CBG MONITORING, ED    EKG  EKG Interpretation  Date/Time:  Friday May 31 2017 12:23:21 EDT Ventricular Rate:  99 PR Interval:    QRS Duration: 128 QT Interval:  421 QTC Calculation: 541 R Axis:   -78 Text Interpretation:  Atrial fibrillation Nonspecific IVCD with LAD LVH with secondary repolarization abnormality No significant change since last tracing Confirmed by Gareth Morgan [phone]) on 05/23/2017 1:43:27 PM       Radiology Dg Chest Portable 1 View  Result Date: 06/04/2017 CLINICAL DATA:  Shortness of breath. EXAM: PORTABLE CHEST 1 VIEW COMPARISON:  CT 05/29/2017.  Chest x-ray 10/20/2015. FINDINGS: Cardiomegaly with pulmonary vascular prominence. Low lung volumes with basilar atelectasis. Mild basilar/infiltrates infiltrates. Elevation left hemidiaphragm. Small left pleural effusion. IMPRESSION: 1. Cardiomegaly with pulmonary vascular prominence, bilateral interstitial prominence, small left pleural effusion. Findings suggest mild CHF. 2. Low lung volumes with basilar atelectasis. Stable elevation left hemidiaphragm. Electronically Signed   By: Marcello Moores  Register   On: 06/05/2017 14:31   CRITICAL CARE: hypoglycemia, hyperkalemia Performed by: Alvino Chapel   Total critical care time: 60 minutes  Critical care time was exclusive of separately billable procedures and treating other patients.  Critical care was necessary to treat or prevent imminent or life-threatening deterioration.  Critical care was time spent personally by me on the following activities: development of treatment plan with patient and/or surrogate as well as nursing, discussions with consultants, evaluation of patient's response to treatment, examination of patient, obtaining history from patient or surrogate, ordering and performing treatments and interventions, ordering and review of laboratory studies, ordering and review of radiographic studies,  pulse oximetry and re-evaluation of patient's condition.  Procedures Procedures (including critical care time)  Medications Ordered in ED Medications  norepinephrine (LEVOPHED) 4 mg in dextrose 5 % 250 mL (0.016 mg/mL) infusion (2 mcg/min Intravenous New Bag/Given 06/06/2017 1919)  sodium polystyrene (KAYEXALATE) 15 GM/60ML suspension 30 g (30 g Oral Given 05/23/2017 2012)  albuterol (PROVENTIL) (2.5 MG/3ML) 0.083% nebulizer solution 2.5 mg (not administered)  ondansetron (ZOFRAN) injection 4 mg (not administered)  warfarin (COUMADIN) tablet 2.5 mg (not administered)  Warfarin - Pharmacist Dosing Inpatient (not administered)  heparin injection 1,000-6,000 Units (not administered)  sodium chloride 0.9 % primer fluid for CRRT (not administered)  prismasol BGK 0/2.5 5,000 mL dialysis replacement fluid (not administered)  prismasol BGK 0/2.5 5,000 mL dialysis replacement fluid (not administered)  prismasol BGK 0/2.5 5,000 mL dialysis solution (not administered)  dextrose 50 % solution 50 mL (not administered)  dextrose 50 % solution 100 mL (100 mLs Intravenous Given 05/26/2017 1347)  calcium gluconate 1 g in sodium chloride 0.9 % 100 mL IVPB (0 g Intravenous Stopped 05/13/2017 1443)  sodium chloride 0.9 % bolus 250 mL (0 mLs Intravenous Stopped 05/10/2017 1420)  insulin aspart (novoLOG) injection 5 Units (5 Units Intravenous Given 05/19/2017 1449)  insulin aspart (novoLOG) injection 10 Units (10 Units Intravenous Given 05/12/2017 1746)  dextrose 50 % solution 100 mL (100 mLs Intravenous Given 05/14/2017 1747)  ondansetron (ZOFRAN) 4 MG/2ML injection (4 mg  Given  05/08/2017 1745)     Initial Impression / Assessment and Plan / ED Course  I have reviewed the triage vital signs and the nursing notes.  Pertinent labs & imaging results that were available during my care of the patient were reviewed by me and considered in my medical decision making (see chart for details).     82 year old female with a history of  hypertension, diabetes, chronic atrial fibrillation on Coumadin, chronic systolic heart failure due to nonischemic cardiac myopathy, severe mitral regurg status post failed mitral valve clip, porcelain aorta, COPD, severe AS, recent admission for hypotension/fluid overload with ongoing assessment for possible TAVR, presents with concern for hypotension after it was checked by home health care.  Glucose 39 on arrival to the ED, with patient alert, tolerating po, given orange juice/crackers with continued hypoglycemia and given D50 with improvement.  Labs returned concerning for acute on chronic kidney injury with Cr of 3.9, and hyperkalemia with K of 6.9.  No sign of hyperkalemic EKG changes.  Suspect patient with multifactorial AKI including recent diuresis, recent CT with contrast, diarrhea and hypotension.  Volume status difficult on exam, with weight being slightly increased from discharge as of check yesterday, however on my exam no crackles, edema reportedly improved from prior per pt and friend.  Reportedly patient qualified for O2 but declined, and it is unclear if O2 requirement today is new.  Given my feeling she is slightly volume down but with tenuous fluid status given hx, concern for baseline HF, ordered 250cc NS for hydration.    For hyperkalemia, no ECG changes. Given Ca gluconate given significant elevation, given insulin and dextrose.  Given tenuous fluid status and blood pressures I do not feel comfortable with continued fluid bolus or diuresis.  Discussed with Nephrology, however given patient does not desire dialysis, I feel we can consult as needed.  Patient says she does not want dialysis, even if it was temporary.   Suspect hypotension secondary to worsening cardiac output, low output AS and initially suspected in setting of mild dehydration with diarrhea and recent diuresis in hospital, new medication.  Doubt sepsis given no infection source by hx, no leukocytosis. Doubt acute GI bleed.   Her MAPs have been stable with blood pressures fluctuating to 161W systolic down to 86 at lowest, however she does show possible signs of hypoperfusion with elevated lactic acid and AKI.  Mental status worsened in ED with patient becoming more sleepy however she is still answering questions appropriately.  Discussed patient initially with HF team given her history of low output AS, cardiomyopathy, recent admission to their service. Discussed with Dr. Haroldine Laws who recommends patient stay at [facility] for AKI, inotropic support as needed with pulmonary critical care team.  Discussed with pulmonary critical care, however given blood pressures at this time, do not feel she requires pressors or critical care and recommend hospitalist admission.  Consulted Cardiology general at [facility] and hospitalist.   Will admit for concern for acute renal failure, hyperkalemia with concern for cardiogenic etiology of low blood pressures.    Patient reports she is DNR/DNI and does not want dialysis even if temporary.    Dr. Radford Pax evaluated from Cardiology and recommends transfer to [facility].  Final Clinical Impressions(s) / ED Diagnoses   Final diagnoses:  Hyperkalemia  Hypotension, unspecified hypotension type  Aortic valve stenosis, etiology of cardiac valve disease unspecified  Acute renal failure, unspecified acute renal failure type ([place])  Hypoglycemia    New Prescriptions New Prescriptions  No medications on file     Gareth Morgan, MD 05/16/2017 2034

## 2017-05-31 NOTE — ED Notes (Signed)
[facility] arrived. Pt leaving with them

## 2017-05-31 NOTE — ED Notes (Signed)
Patient will be approved for 1233. Spoke to patient's RN and let her know that we are getting a stat clean on that room and we will call as soon as EVS is finished. Thank you for your understanding.

## 2017-05-31 NOTE — Consult Note (Addendum)
Cardiology Consultation:   Patient ID: [name]; [id number]; January 29, 1935   Admit date: 05/16/2017 Date of Consult: 05/25/2017  Primary Care Provider: Curly Rim, MD Primary Cardiologist: Dr. Haroldine Laws Primary Electrophysiologist:  none   Patient Profile:   Destiny [name] is a 82 y.o. female with a hx of HTN, DM, chronic atrial fibrillation on warfarin, chronic systolic CHF secondary to NIDCM, severe MR s/p failed MV clip, porcelain aorta, severe AS and COPD recently discharged from [facility] after admission for CHF with hypotension and volume overload and underwent TEE for assessment of low output low gradient severe AS with severe MR who is being seen today for the evaluation of hypotension at the request of Dr. Billy Fischer .  History of Present Illness:   Destiny [name] is a 82 year old female with a past medical history of HTN, DM, chronic atrial fibrillation on warfarin, chronic systolic CHF due to NICM, severe MR s/p failed MV clip, porcelain aorta, COPD, and severe AS. She was seen in the heart failure clinic on 05/08/17, noted to be hypotensive and volume overloaded. She was admitted for IV diuresis and underwent TEE to further assess AS and consider possible TAVR. TEE showed low gradient severe AS, severe eccentric MR which was similar to prior TEE likely from flail segment of the anterior leaflet, moderate to severe TR. LVEF was 55-60%. Dr. Cyndia Bent and Dr. Angelena Form were consulted for TAVR consideration. Right and left heart cath on 05/15/17 showed preserved cardiac output, no CAD. It was felt that her symptoms would improve with TAVR, but significant MR and TR would remain. She was scheduled to have CT scans done outpatient in preparation for TAVR as well as followup with Dr. Roxy Manns as an outpatient. CT scans were not done in house as creatinine was 1.56 (1.26 the day prior), and the patient was adamant to return home.   This has not occurred yet.  She also qualified for home oxygen  but refused to wear it.  During that hospitalization she diuresed well with IV lasix and metolazone. Weight went down 13 pounds total; discharge weight was 154 pounds. Her Delene Loll was stopped on admission due to normal EF and soft BP.   She says that she thinks she has been doing well since her discharge.  She says that she saw her PCP yesterday and he ordered blood work and a UA.  She went to take a shower today and her home health nurse showed up to assess her and found her BP to be 80/38mHg and called EMS.  Repeat BP was 100/27mmHg.  She admits that she has had some SOB and dizziness since her BP med was changed. In ER she was also noted to have a BS of 39.  She denies any chest pain or pressure.  She says that she thinks her LE edema has actually improved since her discharge and she really has not had any problems with PND or orthopnea although I'm not sure how good of a historian she is.  In ER K+ elevated at 6.9 and reported not to be hemolyzed.  Her creatinine is elevated at 3.96 (up from 1.56 at discharge). BNP elevated at 544 (down from 685 on 8/1) and lactic acid 2.20.  Currently BP 102/37mmHg and O2 sats 93% on 2L [place].    Past Medical History:  Diagnosis Date  . Anemia   . Aortic calcification (HCC) 02/20/2016   Involving entire thoracic and abdominal aorta  . Arthritis   . Atrial  fibrillation ([place]) 1999-diagnosed  . Complication of anesthesia    slow to awaken (05/08/2017)  . Diastolic CHF, chronic ([place]) 12/30/2014  . Edema   . Glaucoma, both eyes   . History of blood transfusion 03/2017   "related to anemia"  . History of kidney stones   . Incidental pulmonary nodule    Results of CT scan:  Lungs/Pleura: There is some progressive scarring and reticulonodular opacity in the left upper lobe and lingula extending to the anterior pleural surface. There is some associated mild traction bronchiectasis in the lingula. Findings are suggestive of postinflammatory/postinfectious changes.  Recommend correlation with any active infectious symptoms. CT follow-up would be appro  . Mitral valve insufficiency and aortic valve insufficiency   . Mononeuritis of unspecified site   . Osteoarthrosis, unspecified whether generalized or localized, unspecified site    "all over" (05/08/2017)  . Pure hypercholesterolemia   . Skin cancer    left temple  . Type II diabetes mellitus ([place])   . Unspecified essential hypertension     Past Surgical History:  Procedure Laterality Date  . ABDOMINAL HYSTERECTOMY  2000   partial  . APPENDECTOMY  1954  . CARDIAC CATHETERIZATION N/A 11/16/2015   Procedure: Right/Left Heart Cath and Coronary Angiography;  Surgeon: Larey Dresser, MD;  Location: [place] [facility];  Service: Cardiovascular;  Laterality: N/A;  . CATARACT EXTRACTION W/ INTRAOCULAR LENS IMPLANT Left   . CYSTOSCOPY W/ RETROGRADES Right 11/26/2012   Procedure: CYSTOSCOPY WITH RETROGRADE PYELOGRAM;  Surgeon: Alexis Frock, MD;  Location: [facility];  Service: Urology;  Laterality: Right;  . ESOPHAGOGASTRODUODENOSCOPY (EGD) WITH PROPOFOL N/A 01/03/2017   Procedure: ESOPHAGOGASTRODUODENOSCOPY (EGD) WITH PROPOFOL;  Surgeon: Clarene Essex, MD;  Location: [facility] ENDOSCOPY;  Service: Endoscopy;  Laterality: N/A;  . HOLMIUM LASER APPLICATION Right 3/97/6734   Procedure: HOLMIUM LASER APPLICATION;  Surgeon: Alexis Frock, MD;  Location: [facility];  Service: Urology;  Laterality: Right;  . RIGHT/LEFT HEART CATH AND CORONARY ANGIOGRAPHY N/A 05/15/2017   Procedure: RIGHT/LEFT HEART CATH AND CORONARY ANGIOGRAPHY;  Surgeon: Larey Dresser, MD;  Location: [place] [facility];  Service: Cardiovascular;  Laterality: N/A;  . SKIN CANCER EXCISION Left    temple  . TEE WITHOUT CARDIOVERSION N/A 12/08/2015   Procedure: TRANSESOPHAGEAL ECHOCARDIOGRAM (TEE);  Surgeon: Larey Dresser, MD;  Location: [place];  Service: Cardiovascular;  Laterality: N/A;  . TEE WITHOUT CARDIOVERSION N/A  05/10/2017   Procedure: TRANSESOPHAGEAL ECHOCARDIOGRAM (TEE);  Surgeon: Larey Dresser, MD;  Location: [facility] ENDOSCOPY;  Service: Cardiovascular;  Laterality: N/A;  . TONSILLECTOMY  1943  . TOTAL KNEE ARTHROPLASTY Right 2009  . TRANSESOPHAGEAL ECHOCARDIOGRAM  02/2012     Home Medications:  Prior to Admission medications   Medication Sig Start Date End Date Taking? Authorizing Provider  acetaminophen (TYLENOL) 325 MG tablet Take 2 tablets (650 mg total) by mouth every 6 (six) hours as needed for mild pain (or Fever >/= 101). 01/03/17  Yes Regalado, Belkys A, MD  atorvastatin (LIPITOR) 20 MG tablet Take 1 tablet (20 mg total) by mouth daily. 02/18/17  Yes Larey Dresser, MD  Calcium Carbonate-Vit D-Min 600-400 MG-UNIT TABS Take 1 tablet by mouth daily.    Yes [provider]  Cinnamon 500 MG capsule Take 500 mg by mouth daily.     Yes [provider]  dorzolamide-timolol (COSOPT) 22.3-6.8 MG/ML ophthalmic solution Place 1 drop into both eyes 2 (two) times daily.    Yes [provider]  ferrous sulfate 325 (65 FE) MG tablet Take 325 mg by mouth daily with breakfast.   Yes [provider]  gabapentin (NEURONTIN) 300 MG capsule Take 300 mg by mouth at bedtime.    Yes [provider]  glipiZIDE (GLUCOTROL XL) 10 MG 24 hr tablet Take 1 tablet (10 mg total) by mouth daily. 10/24/15  Yes Kuneff, Renee A, DO  glucose blood (ACCU-CHEK AVIVA PLUS) test strip Use as instructed 10/28/15  Yes Kuneff, Renee A, DO  latanoprost (XALATAN) 0.005 % ophthalmic solution Place 1 drop into both eyes at bedtime.    Yes [provider]  linagliptin (TRADJENTA) 5 MG TABS tablet Take 5 mg by mouth daily.   Yes [provider]  magnesium oxide (MAG-OX) 400 MG tablet Take 400 mg by mouth daily.   Yes [provider]  metoprolol succinate (TOPROL-XL) 25 MG 24 hr tablet Take 3 tablets (75 mg total) by mouth 2 (two) times daily. 05/17/17  Yes Arbutus Leas, NP    Multiple Vitamins-Minerals (MULTIVITAL) tablet Take 1 tablet by mouth daily.     Yes [provider]  Omega-3 Fatty Acids (FISH OIL) 1000 MG CAPS Take 1 capsule by mouth daily.    Yes [provider]  pantoprazole (PROTONIX) 40 MG tablet Take 1 tablet (40 mg total) by mouth daily. 01/03/17  Yes Regalado, Belkys A, MD  potassium chloride SA (K-DUR,KLOR-CON) 20 MEQ tablet Take 2 tablets (40 mEq total) by mouth daily. 05/18/17  Yes Arbutus Leas, NP  tiotropium (SPIRIVA HANDIHALER) 18 MCG inhalation capsule Place 1 capsule (18 mcg total) into inhaler and inhale daily. 03/01/16  Yes Larey Dresser, MD  torsemide (DEMADEX) 20 MG tablet Take 4 tablets (80 mg total) by mouth 2 (two) times daily. 05/02/17  Yes Larey Dresser, MD  vitamin E 400 UNIT capsule Take 400 Units by mouth daily.     Yes [provider]  warfarin (COUMADIN) 5 MG tablet Take 5 mg daily on except on Monday and Friday take 2.5 mg. 05/17/17  Yes Arbutus Leas, NP    Inpatient Medications: Scheduled Meds: . ondansetron      . dextrose  2 ampule Intravenous Once  . heparin  5,000 Units Subcutaneous Q12H  . insulin aspart  10 Units Intravenous Once   Continuous Infusions:  PRN Meds:   Allergies:   No Known Allergies  Social History:   Social History   Social History  . Marital status: Widowed    Spouse name: N/A  . Number of children: N/A  . Years of education: N/A   Occupational History  . Not on file.   Social History Main Topics  . Smoking status: Former Smoker    Packs/day: 3.50    Years: 34.00    Types: Cigarettes    Quit date: 07/11/1988  . Smokeless tobacco: Never Used  . Alcohol use Yes     Comment: 05/08/2017 "none in ages"  . Drug use: No  . Sexual activity: No   Other Topics Concern  . Not on file   Social History Narrative   Widow. Lives alone. Attended business college.   Lives in one story home. Has living will.    Walker and/or four pronged cane for assistance.     Former smoker of 29 years. Quit smoking 1989.   Patient drinks caffeinated beverages, takes a daily vitamin.   Wears her seatbelt, wears dentures, smoke detector located in the home.  Family History:    Family History  Problem Relation Age of Onset  . Cancer Mother 34       leukemia   . Breast cancer Neg Hx   . Colon cancer Neg Hx      ROS:  Please see the history of present illness.  ROS  All other ROS reviewed and negative.     Physical Exam/Data:   Vitals:   05/09/2017 1500 05/10/2017 1530 05/27/2017 1600 05/30/2017 1701  BP: 96/69 (!) 88/58 96/72 102/70  Pulse:  86 95 89  Resp: 11 13 18 16   Temp:      TempSrc:      SpO2:  95% 95% 94%  Weight:      Height:        Intake/Output Summary (Last 24 hours) at 05/29/2017 1743 Last data filed at 05/14/2017 1443  Gross per 24 hour  Intake              360 ml  Output                0 ml  Net              360 ml   Filed Weights   05/20/2017 1401  Weight: 154 lb (69.9 kg)   Body mass index is 30.08 kg/m.  General:  Well nourished, well developed, in no acute distress HEENT: normal Lymph: no adenopathy Neck: JVD difficult to assess Endocrine:  No thryomegaly Vascular: No carotid bruits; FA pulses 2+ bilaterally without bruits  Cardiac:  normal S1, S2; RRR; no murmur  Lungs:  Crackles at bases bilaterally  Abd: soft, nontender, no hepatomegaly  Ext: no edema Musculoskeletal:  No deformities, BUE and BLE strength normal and equal Skin: warm and dry  Neuro:  CNs 2-12 intact, no focal abnormalities noted Psych:  Normal affect   EKG:  The EKG was personally reviewed and demonstrates:  Atrial fibrillation with IVCD and LAD Telemetry:  Telemetry was personally reviewed and demonstrates:  Atrial fibrilaltion  Relevant CV Studies: Cardiac cath 05/15/2017 Conclusion   1. Filling pressures remain elevated, R>L.  2. Preserved cardiac output.  3. Moderate pulmonary hypertension, mixed pulmonary venous and pulmonary arterial  hypertension  4. No significant CAD.   Will resume IV Lasix later this evening and will resume warfarin this evening.    TEE 05/10/2017 Impression:  1. Low gradient severe AS.  2. Severe, eccentric MR (similar to prior TEE), likely from flail segment of anterior leaflet.  3. Moderate to severe TR>  4. Normal LV systolic function.   Laboratory Data:  Chemistry Recent Labs Lab 05/13/2017 1325 05/14/2017 1335 05/15/2017 1617  NA 138 137 139  K 6.9* 6.8* 6.9*  CL 100* 100* 101  CO2 31  --  29  GLUCOSE 51* 45* 118*  BUN 73* 69* 75*  CREATININE 3.81* 3.90* 3.96*  CALCIUM 9.3  --  9.5  GFRNONAA 10*  --  10*  GFRAA 12*  --  11*  ANIONGAP 7  --  9     Recent Labs Lab 05/15/2017 1325  PROT 7.2  ALBUMIN 3.3*  AST 21  ALT 12*  ALKPHOS 48  BILITOT 0.6   Hematology Recent Labs Lab 05/09/2017 1325 05/08/2017 1335  WBC 8.1  --   RBC 3.35*  --   HGB 9.5* 10.5*  HCT 31.7* 31.0*  MCV 94.6  --   MCH 28.4  --   MCHC 30.0  --   RDW 18.9*  --  PLT 228  --    Cardiac EnzymesNo results for input(s): TROPONINI in the last 168 hours.  Recent Labs Lab 05/28/2017 1334  TROPIPOC 0.05    BNP Recent Labs Lab 06/06/2017 1325  BNP 544.0*    DDimer No results for input(s): DDIMER in the last 168 hours.  Radiology/Studies:  Ct Coronary Morph W/cta Cor W/score W/ca W/cm &/or Wo/cm  Result Date: 05/29/2017 EXAM: OVER-READ INTERPRETATION  CT CHEST The following report is an over-read performed by radiologist Dr. Alvino Blood [facility] on 05/29/2017. This over-read does not include interpretation of cardiac or coronary anatomy or pathology. The coronary CTA interpretation by the cardiologist is attached. COMPARISON:  Chest CT 02/02/2016 FINDINGS: Limited view of the lung parenchyma demonstrates branching nodularity in the with upper lobe mildly improved fropm comparison exam. Mild atelectasis also mildly improved. No new pulmonary abnormality. Linear scarring at the RIGHT lung  base. Airways are normal. Limited view of the mediastinum demonstrates no adenopathy. Small low moderate size hiatal hernia. Limited view of the upper abdomen is unremarkable. Reflux of contrast into the hepatic veins consistent with RIGHT heart failure. Limited view of the skeleton and chest wall is unremarkable. IMPRESSION: 1. Improvement in chronic infectious pattern in the LEFT upper lobe (consider mycobacterium avium intracellular). 2. Small to moderate hiatal hernia. Electronically Signed   By: Suzy Bouchard M.D.   On: 05/29/2017 16:35   Dg Chest Portable 1 View  Result Date: 05/12/2017 CLINICAL DATA:  Shortness of breath. EXAM: PORTABLE CHEST 1 VIEW COMPARISON:  CT 05/29/2017.  Chest x-ray 10/20/2015. FINDINGS: Cardiomegaly with pulmonary vascular prominence. Low lung volumes with basilar atelectasis. Mild basilar/infiltrates infiltrates. Elevation left hemidiaphragm. Small left pleural effusion. IMPRESSION: 1. Cardiomegaly with pulmonary vascular prominence, bilateral interstitial prominence, small left pleural effusion. Findings suggest mild CHF. 2. Low lung volumes with basilar atelectasis. Stable elevation left hemidiaphragm. Electronically Signed   By: Marcello Moores  Register   On: 05/28/2017 14:31    Assessment and Plan:   1. Acute on chronic diastolicCHF: Echo (3/82) with EF 30-35%, diffuse hypokinesis, improved to 45% on 3/17 TEE and then to 60-65% on 3/18 echo. EF remains normal at 55-60% on echo done in 7/18. TEE 05/2017 confirmed preserved LVF.  Nonischemic cardiomyopathy, no significant coronary disease on angiography last admission. Acute decompensation likely related to her valvular disease with worsening AS and ongoing MR. She was admitted 8/1 with hypotension, AKI and volume overload and diuresed. She is now readmitted again with hypotension and now much worse AKI with hyperkalemia.  - Weight 154lbs on discharge and 154lbs today.  - her home dose of torsemide is 80 mg BID  - Entresto  and aldactone stopped last hospitalization due to normal LVF.  - Hold Toprol due to hypotension - she still appears volume overloaded with crackles at bases, elevated BNP and cxray with mild CHF - discussed case with Dr. Haroldine Laws.  I feel she should be transferred to [name] ICU for further management.  Goals of care going forward need to be addressed given turn of events.  Her hyperkalemia has not improved with insulin/Ca+.  She has interstitial edema due to her severe AS but low cardiac output likely playing a role in worsening renal failure.  I think she may need CVVHD to reverse her hyperkalemia and will likely need pressor support with IV levo in the mean time to maintain adequate BP.  Her BP currently is [id number] systolic.   - start Levophed to maintain MAP 65 - transfer to  ICU at [facility] on CCM service for management of acute renal failure and metabolic derangements.    2. Permanent Atrial fibrillation: HR well controlled - Will need to hold Toprol for now due to hypotension - continue warfarin - INR 2.56 today - will have pharmacy dose while in hospital  3. Low gradient low output severe AS with normal LVF on TEE 8/3, no AI.   Likely that valvular heart disease with AS and severe MR are triggering her decompensation.  - recent cath showed normal coronary arteries - TEE and cath confirmed severe AS and severe MR - workup for TAVR in process but has not seen Dr. Roxy Manns back or had Chest CT - need to have family conversation in regards to goals of care going forward.  She is likely not a candidate for TAVR at this time.  4. Pulmonary hypertension: Moderate pulmonary hypertension on RHC but PVR not significantly elevated, suspect pulmonary venous hypertension.    5. Severe Mitral regurgitation: with flail segment of anterior leaflet likely from rupture of an anterior leaflet chord with very eccentric mitral regurgitation. MR was severe on 3/17 TEE and recent TEE. Was seen for surgery eval for MV  repair and AoV replacement. However, given porcelain aorta, she was deemed not a candidate for open heart surgery. She was sent for percutaneous MV clipping. This was attempted by Dr Bridgette Habermann at [facility] but procedure failed due to the pattern of MV calcification.  Unfortunately, there is now way to repair this.  May improve with correction of severe AS.   6.  Severe hyperkalemia secondary to acute renal failure.   - no EKG changes of hyperkalemia - repeat BMET and K still 6.9. - stop Kdur - given Insulin/D50 and calcium gluconate in ER x 2 with persistent hyperkalemia - Will give Kayexalate now  7. Anemia: Upper GI bleed in 3/18, possibly from gastric ulcer. She is back on warfarin. Recent colonoscopy with no bleeding source. Stool is still dark, hemoglobin is stably low.  - hgb stable at 10.5.   8. Type II diabetes: - Continue SSI - Linagliptin stopped last admission with CHF.  - Hold glipizide due to severe hypoglycemia  9. Acute on chronic kidney disease - Creatinine markedly worsened since discharge (1.56>3.96) likely related to hypotension and low output severe AS - Will have CCM consider CVVHD due to creatinine of almost 4 and hyperkalemia not responsive to meds  The patient is critically ill with multiple organ systems failure and requires high complexity decision making for assessment and support, frequent evaluation and titration of therapies, application of advanced monitoring technologies and extensive interpretation of multiple databases. Critical Care Time devoted to patient care services described in this note independent of APP time is 60 minutes with >50% of time spent in direct patient care.    Signed, Fransico Him, MD  05/19/2017 5:43 PM

## 2017-05-31 NOTE — ED Triage Notes (Signed)
Per EMS, Home health nurse called to report hypotensive episiode. BP reading 80/60. BP on EMS arrival was 100/70. Patient states that BP meds have been recently changed and she has experienced periods of SOB and vertigo daily since the change. Patient has hx of Afib and renal insufficiency. No fluids or meds given during transport.

## 2017-05-31 NOTE — ED Notes (Signed)
Pt experienced one episode of vomiting. [facility] made aware/

## 2017-05-31 NOTE — Consult Note (Signed)
Reason for Consult:AKI Referring Physician: Dr. Lucky Cowboy is an 82 y.o. female.  HPI: 82 yr female with hx DM , DJD, anemia , afib , Aortic stenosis, MR, Hx stones by notes (but she denies), skin Ca, HTN .  Recurrent CHF from AS and MR, being considered for TAVR.  No known hx renal dz.  Brought to ED today with lethargy, low BS, SOB, low bps.  Cr recently 1.27-1.5.  Cr today 3.8, k 6.9.    Feels weak, sleepy, N, V, D.  No urine since yesterday.  Chills, no cough, or CP.  Knows ^ankle edema. Constitutional: weak, tired, and as above Eyes: vision stable, no loss, no pain Ears, nose, mouth, throat, and face: no  Respiratory: negative Cardiovascular: as above, edema, more SOB Gastrointestinal: N, V ,D Genitourinary:no urine since yesterday Integument/breast: negative Hematologic/lymphatic: known anemia Musculoskeletal:negative, but hx DJD Neurological: more sleepy, weak Behavioral/Psych: negative Allergic/Immunologic: negative   Past Medical History:  Diagnosis Date  . Anemia   . Aortic calcification (HCC) 02/20/2016   Involving entire thoracic and abdominal aorta  . Arthritis   . Atrial fibrillation ([place]) 1999-diagnosed  . Complication of anesthesia    slow to awaken (05/08/2017)  . Diastolic CHF, chronic ([place]) 12/30/2014  . Edema   . Glaucoma, both eyes   . History of blood transfusion 03/2017   "related to anemia"  . History of kidney stones   . Incidental pulmonary nodule    Results of CT scan:  Lungs/Pleura: There is some progressive scarring and reticulonodular opacity in the left upper lobe and lingula extending to the anterior pleural surface. There is some associated mild traction bronchiectasis in the lingula. Findings are suggestive of postinflammatory/postinfectious changes. Recommend correlation with any active infectious symptoms. CT follow-up would be appro  . Mitral valve insufficiency and aortic valve insufficiency   . Mononeuritis of unspecified site    . Osteoarthrosis, unspecified whether generalized or localized, unspecified site    "all over" (05/08/2017)  . Pure hypercholesterolemia   . Skin cancer    left temple  . Type II diabetes mellitus ([place])   . Unspecified essential hypertension     Past Surgical History:  Procedure Laterality Date  . ABDOMINAL HYSTERECTOMY  2000   partial  . APPENDECTOMY  1954  . CARDIAC CATHETERIZATION N/A 11/16/2015   Procedure: Right/Left Heart Cath and Coronary Angiography;  Surgeon: Larey Dresser, MD;  Location: [place] [facility];  Service: Cardiovascular;  Laterality: N/A;  . CATARACT EXTRACTION W/ INTRAOCULAR LENS IMPLANT Left   . CYSTOSCOPY W/ RETROGRADES Right 11/26/2012   Procedure: CYSTOSCOPY WITH RETROGRADE PYELOGRAM;  Surgeon: Alexis Frock, MD;  Location: [facility];  Service: Urology;  Laterality: Right;  . ESOPHAGOGASTRODUODENOSCOPY (EGD) WITH PROPOFOL N/A 01/03/2017   Procedure: ESOPHAGOGASTRODUODENOSCOPY (EGD) WITH PROPOFOL;  Surgeon: Clarene Essex, MD;  Location: [facility] ENDOSCOPY;  Service: Endoscopy;  Laterality: N/A;  . HOLMIUM LASER APPLICATION Right 3/81/0175   Procedure: HOLMIUM LASER APPLICATION;  Surgeon: Alexis Frock, MD;  Location: [facility];  Service: Urology;  Laterality: Right;  . RIGHT/LEFT HEART CATH AND CORONARY ANGIOGRAPHY N/A 05/15/2017   Procedure: RIGHT/LEFT HEART CATH AND CORONARY ANGIOGRAPHY;  Surgeon: Larey Dresser, MD;  Location: [place] [facility];  Service: Cardiovascular;  Laterality: N/A;  . SKIN CANCER EXCISION Left    temple  . TEE WITHOUT CARDIOVERSION N/A 12/08/2015   Procedure: TRANSESOPHAGEAL ECHOCARDIOGRAM (TEE);  Surgeon: Larey Dresser, MD;  Location: [place];  Service:  Cardiovascular;  Laterality: N/A;  . TEE WITHOUT CARDIOVERSION N/A 05/10/2017   Procedure: TRANSESOPHAGEAL ECHOCARDIOGRAM (TEE);  Surgeon: Larey Dresser, MD;  Location: [facility] ENDOSCOPY;  Service: Cardiovascular;  Laterality: N/A;  . TONSILLECTOMY  1943   . TOTAL KNEE ARTHROPLASTY Right 2009  . TRANSESOPHAGEAL ECHOCARDIOGRAM  02/2012    Family History  Problem Relation Age of Onset  . Cancer Mother 46       leukemia   . Breast cancer Neg Hx   . Colon cancer Neg Hx     Social History:  reports that she quit smoking about 28 years ago. Her smoking use included Cigarettes. She has a 119.00 pack-year smoking history. She has never used smokeless tobacco. She reports that she drinks alcohol. She reports that she does not use drugs.  Allergies: No Known Allergies  Medications: I have reviewed the patient's current medications. Prior to Admission:  (Not in a hospital admission)   Results for orders placed or performed during the hospital encounter of 06/03/2017 (from the past 48 hour(s))  CBG monitoring, ED     Status: Abnormal   Collection Time: 05/27/2017 12:31 PM  Result Value Ref Range   Glucose-Capillary 39 (LL) 65 - 99 mg/dL  CBG monitoring, ED     Status: Abnormal   Collection Time: 05/08/2017  1:20 PM  Result Value Ref Range   Glucose-Capillary 43 (LL) 65 - 99 mg/dL  CBC with Differential     Status: Abnormal   Collection Time: 06/04/2017  1:25 PM  Result Value Ref Range   WBC 8.1 4.0 - 10.5 K/uL   RBC 3.35 (L) 3.87 - 5.11 MIL/uL   Hemoglobin 9.5 (L) 12.0 - 15.0 g/dL   HCT 31.7 (L) 36.0 - 46.0 %   MCV 94.6 78.0 - 100.0 fL   MCH 28.4 26.0 - 34.0 pg   MCHC 30.0 30.0 - 36.0 g/dL   RDW 18.9 (H) 11.5 - 15.5 %   Platelets 228 150 - 400 K/uL   Neutrophils Relative % 75 %   Neutro Abs 6.1 1.7 - 7.7 K/uL   Lymphocytes Relative 12 %   Lymphs Abs 1.0 0.7 - 4.0 K/uL   Monocytes Relative 10 %   Monocytes Absolute 0.8 0.1 - 1.0 K/uL   Eosinophils Relative 3 %   Eosinophils Absolute 0.2 0.0 - 0.7 K/uL   Basophils Relative 0 %   Basophils Absolute 0.0 0.0 - 0.1 K/uL  Comprehensive metabolic panel     Status: Abnormal   Collection Time: 05/18/2017  1:25 PM  Result Value Ref Range   Sodium 138 135 - 145 mmol/L   Potassium 6.9 (HH) 3.5 -  5.1 mmol/L    Comment: CRITICAL RESULT CALLED TO, READ BACK BY AND VERIFIED WITH: M.QUICK RN 05/11/2017 1408 J.ROCK    Chloride 100 (L) 101 - 111 mmol/L   CO2 31 22 - 32 mmol/L   Glucose, Bld 51 (L) 65 - 99 mg/dL   BUN 73 (H) 6 - 20 mg/dL   Creatinine, Ser 3.81 (H) 0.44 - 1.00 mg/dL   Calcium 9.3 8.9 - 10.3 mg/dL   Total Protein 7.2 6.5 - 8.1 g/dL   Albumin 3.3 (L) 3.5 - 5.0 g/dL   AST 21 15 - 41 U/L   ALT 12 (L) 14 - 54 U/L   Alkaline Phosphatase 48 38 - 126 U/L   Total Bilirubin 0.6 0.3 - 1.2 mg/dL   GFR calc non Af Amer 10 (L) >60 mL/min   GFR calc  Af Amer 12 (L) >60 mL/min    Comment: (NOTE) The eGFR has been calculated using the CKD EPI equation. This calculation has not been validated in all clinical situations. eGFR's persistently <60 mL/min signify possible Chronic Kidney Disease.    Anion gap 7 5 - 15  Brain natriuretic peptide     Status: Abnormal   Collection Time: 05/30/2017  1:25 PM  Result Value Ref Range   B Natriuretic Peptide 544.0 (H) 0.0 - 100.0 pg/mL  Protime-INR     Status: Abnormal   Collection Time: 05/30/2017  1:25 PM  Result Value Ref Range   Prothrombin Time 28.0 (H) 11.4 - 15.2 seconds   INR 2.56   I-Stat Troponin, ED (not at [facility])     Status: None   Collection Time: 06/06/2017  1:34 PM  Result Value Ref Range   Troponin i, poc 0.05 0.00 - 0.08 ng/mL   Comment 3            Comment: Due to the release kinetics of cTnI, a negative result within the first hours of the onset of symptoms does not rule out myocardial infarction with certainty. If myocardial infarction is still suspected, repeat the test at appropriate intervals.   I-Stat CG4 Lactic Acid, ED     Status: Abnormal   Collection Time: 05/23/2017  1:35 PM  Result Value Ref Range   Lactic Acid, Venous 2.20 (HH) 0.5 - 1.9 mmol/L  I-Stat Chem 8, ED     Status: Abnormal   Collection Time: 06/04/2017  1:35 PM  Result Value Ref Range   Sodium 137 135 - 145 mmol/L   Potassium 6.8 (HH) 3.5 - 5.1  mmol/L   Chloride 100 (L) 101 - 111 mmol/L   BUN 69 (H) 6 - 20 mg/dL   Creatinine, Ser 3.90 (H) 0.44 - 1.00 mg/dL   Glucose, Bld 45 (L) 65 - 99 mg/dL   Calcium, Ion 1.20 1.15 - 1.40 mmol/L   TCO2 30 22 - 32 mmol/L   Hemoglobin 10.5 (L) 12.0 - 15.0 g/dL   HCT 31.0 (L) 36.0 - 46.0 %   Comment NOTIFIED PHYSICIAN   CBG monitoring, ED     Status: Abnormal   Collection Time: 05/10/2017  2:33 PM  Result Value Ref Range   Glucose-Capillary 142 (H) 65 - 99 mg/dL  CBG monitoring, ED     Status: Abnormal   Collection Time: 05/13/2017  3:19 PM  Result Value Ref Range   Glucose-Capillary 160 (H) 65 - 99 mg/dL  I-Stat CG4 Lactic Acid, ED     Status: Abnormal   Collection Time: 05/29/2017  4:11 PM  Result Value Ref Range   Lactic Acid, Venous 2.65 (HH) 0.5 - 1.9 mmol/L   Comment NOTIFIED PHYSICIAN   Basic metabolic panel     Status: Abnormal   Collection Time: 05/10/2017  4:17 PM  Result Value Ref Range   Sodium 139 135 - 145 mmol/L   Potassium 6.9 (HH) 3.5 - 5.1 mmol/L    Comment: CRITICAL RESULT CALLED TO, READ BACK BY AND VERIFIED WITH: BRAWNER,M. RN _0  ON 08.24.18 BY COHEN,K    Chloride 101 101 - 111 mmol/L   CO2 29 22 - 32 mmol/L   Glucose, Bld 118 (H) 65 - 99 mg/dL   BUN 75 (H) 6 - 20 mg/dL   Creatinine, Ser 3.96 (H) 0.44 - 1.00 mg/dL   Calcium 9.5 8.9 - 10.3 mg/dL   GFR calc non Af Amer 10 (L) >60 mL/min  GFR calc Af Amer 11 (L) >60 mL/min    Comment: (NOTE) The eGFR has been calculated using the CKD EPI equation. This calculation has not been validated in all clinical situations. eGFR's persistently <60 mL/min signify possible Chronic Kidney Disease.    Anion gap 9 5 - 15  Blood gas, venous     Status: Abnormal (Preliminary result)   Collection Time: 05/25/2017  4:18 PM  Result Value Ref Range   O2 Content PENDING L/min   pH, Ven 7.266 7.250 - 7.430   pCO2, Ven 61.6 (H) 44.0 - 60.0 mmHg   pO2, Ven BELOW REPORTABLE RANG. 32.0 - 45.0 mmHg    Comment: CRITICAL RESULT CALLED TO,  READ BACK BY AND VERIFIED WITH: E.SCHLOSSMAN, MD AT 5638 BY M.JESTER, RRT, RCP ON 8 BELOW REPORTABLE RANGE. CRITICAL RESULT CALLED TO, READ BACK BY AND VERIFIED WITH: E.SCHLOSSMAN,MD AT 1421 BY M.JESTER RRT,RCP ON 8    Bicarbonate 27.1 20.0 - 28.0 mmol/L   Acid-Base Excess 0.1 0.0 - 2.0 mmol/L   O2 Saturation 33.4 %   Patient temperature 98.6    Collection site VENOUS    Drawn by DRAWN BY RN    Sample type VENOUS   POC CBG, ED     Status: None   Collection Time: 05/17/2017  5:14 PM  Result Value Ref Range   Glucose-Capillary 93 65 - 99 mg/dL    Dg Chest Portable 1 View  Result Date: 05/08/2017 CLINICAL DATA:  Shortness of breath. EXAM: PORTABLE CHEST 1 VIEW COMPARISON:  CT 05/29/2017.  Chest x-ray 10/20/2015. FINDINGS: Cardiomegaly with pulmonary vascular prominence. Low lung volumes with basilar atelectasis. Mild basilar/infiltrates infiltrates. Elevation left hemidiaphragm. Small left pleural effusion. IMPRESSION: 1. Cardiomegaly with pulmonary vascular prominence, bilateral interstitial prominence, small left pleural effusion. Findings suggest mild CHF. 2. Low lung volumes with basilar atelectasis. Stable elevation left hemidiaphragm. Electronically Signed   By: Marcello Moores  Register   On: 05/16/2017 14:31    ROS Blood pressure (!) 101/57, pulse 99, temperature 98 F (36.7 C), temperature source Oral, resp. rate 16, height 5' (1.524 m), weight 69.9 kg (154 lb), SpO2 96 %. Physical Exam Physical Examination: General appearance - pale , does not open eyes, lethargic Mental status - oriented but slow Eyes - pupils equal and reactive, extraocular eye movements intact, funduscopic exam normal, discs flat and sharp Ears - bilateral TM's and external ear canals normal Mouth - edentulous Neck - adenopathy noted PCL Lymphatics - posterior cervical nodes Chest - rales noted bibasilar, decreased air entry noted bilat Heart - irregularly irregular rhythm with rate 75I - 433, systolic murmur IR5/1  at 2nd left intercostal space, Gr 2/6 holosys M at apex Abdomen - pos bs, liver down 8 cm Musculoskeletal - no joint tenderness, deformity or swelling Extremities - pedal edema 2 +, feet and hands cool, Tr DP Skin - pale, trophic changes in feet, erythema on calves  Assessment/Plan: 1 AKI ^ K, ^ vol. .  Suspect cardiogenic.  ^ with po suppl.  Not diuresing.  Suspect low output. R/o obstruction. Needs CRRT to lower K.  Hemodynamically unstable 2 AS severe 3 MR 4 Afib. 5 DM 6 Anemia 7 CKD 3 suspect DM will eval P Have discussed options with her and she wants to go ahead with CRRt. . Get U/S, Fe, PTH, slowly lower vol. Will need pressors.  Need Goals of care,         Ridge Lafond L 05/28/2017, 8:15 PM

## 2017-05-31 NOTE — ED Notes (Signed)
Pt provided a urine sample but it was contaminated with stool. Pt is aware a urine sample is needed.

## 2017-05-31 NOTE — ED Notes (Signed)
Bed: WA21 Expected date:  Expected time:  Means of arrival:  Comments: 82 yo hypotension, IV established

## 2017-05-31 NOTE — ED Notes (Signed)
MD notified of CBG of 39. Patient given orange juice. Patient alert and oriented x4.

## 2017-05-31 NOTE — ED Notes (Signed)
MD notified of CBG 43. Patient given graham crackers orange juice and peanut butter.

## 2017-05-31 NOTE — H&P (Signed)
History and Physical    Destiny [name] ACZ:[id number] DOB: 06-May-1935 DOA: 05/30/2017  PCP: Curly Rim, MD Patient coming from: home  I have personally briefly reviewed patient's old medical records in [place]  Chief Complaint: hypotension  HPI: Destiny [name] is a 82 y.o. female with medical history significant for chronic systolic heart failure, severe MR, COPD severe aortic stenosis admitted with hypotension. She has a complex medical history. She lives at home alone. Home health came to check on her and found that her blood pressure was low so she was sent to the ER. She also complains that she is she is lightheaded she short of breath.. She denies any chest pain. Denies any nausea vomiting diarrhea denies any cough headaches or changes with her vision. The ER doctor communicated with Dr. Haroldine Laws and with CCU and with nephrology. Patient is a DO NOT RESUSCITATE and patient does not want dialysis. Patient was seen by cardiology in the emergency room after I saw the patient and requested to transfer this patient to [facility]. Her creatinine is elevated at 3.8 which is new. The last creatinine I have is 1.56 which is up from 1.26.Marland Kitchen She was discharged home on Lasix and metolazone.  ED Course: In the ER she was found to have acute kidney injury with a creatinine of 3.8 potassium of 6.8 she was pharmacy replace [place] SMA-20 hi this is Dr. Zigmund Daniel causes on 2 different actually 1 patient Shavelle last name is HALSTORS yes she isn't anyone so her potassium is very high she has severe hyperkalemia given IV hydration. She was given calcium gluconate for the hyperkalemia. She was also given dextrose and insulin. Her chest x-ray showed cardiomegaly with pulmonary vascular prominence, bilateral interstitial prominence, and small left pleural effusion. Her findings were consistent with mild congestive heart failure.  Review of Systems: As per HPI otherwise 10 point review of systems negative.     Past Medical History:  Diagnosis Date  . Anemia   . Aortic calcification (HCC) 02/20/2016   Involving entire thoracic and abdominal aorta  . Arthritis   . Atrial fibrillation ([place]) 1999-diagnosed  . Complication of anesthesia    slow to awaken (05/08/2017)  . Diastolic CHF, chronic ([place]) 12/30/2014  . Edema   . Glaucoma, both eyes   . History of blood transfusion 03/2017   "related to anemia"  . History of kidney stones   . Incidental pulmonary nodule    Results of CT scan:  Lungs/Pleura: There is some progressive scarring and reticulonodular opacity in the left upper lobe and lingula extending to the anterior pleural surface. There is some associated mild traction bronchiectasis in the lingula. Findings are suggestive of postinflammatory/postinfectious changes. Recommend correlation with any active infectious symptoms. CT follow-up would be appro  . Mitral valve insufficiency and aortic valve insufficiency   . Mononeuritis of unspecified site   . Osteoarthrosis, unspecified whether generalized or localized, unspecified site    "all over" (05/08/2017)  . Pure hypercholesterolemia   . Skin cancer    left temple  . Type II diabetes mellitus ([place])   . Unspecified essential hypertension     Past Surgical History:  Procedure Laterality Date  . ABDOMINAL HYSTERECTOMY  2000   partial  . APPENDECTOMY  1954  . CARDIAC CATHETERIZATION N/A 11/16/2015   Procedure: Right/Left Heart Cath and Coronary Angiography;  Surgeon: Larey Dresser, MD;  Location: [facility];  Service: Cardiovascular;  Laterality: N/A;  . CATARACT EXTRACTION W/  INTRAOCULAR LENS IMPLANT Left   . CYSTOSCOPY W/ RETROGRADES Right 11/26/2012   Procedure: CYSTOSCOPY WITH RETROGRADE PYELOGRAM;  Surgeon: Alexis Frock, MD;  Location: [facility];  Service: Urology;  Laterality: Right;  . ESOPHAGOGASTRODUODENOSCOPY (EGD) WITH PROPOFOL N/A 01/03/2017   Procedure: ESOPHAGOGASTRODUODENOSCOPY (EGD) WITH PROPOFOL;   Surgeon: Clarene Essex, MD;  Location: [facility];  Service: Endoscopy;  Laterality: N/A;  . HOLMIUM LASER APPLICATION Right 9/44/9675   Procedure: HOLMIUM LASER APPLICATION;  Surgeon: Alexis Frock, MD;  Location: [facility];  Service: Urology;  Laterality: Right;  . RIGHT/LEFT HEART CATH AND CORONARY ANGIOGRAPHY N/A 05/15/2017   Procedure: RIGHT/LEFT HEART CATH AND CORONARY ANGIOGRAPHY;  Surgeon: Larey Dresser, MD;  Location: [facility];  Service: Cardiovascular;  Laterality: N/A;  . SKIN CANCER EXCISION Left    temple  . TEE WITHOUT CARDIOVERSION N/A 12/08/2015   Procedure: TRANSESOPHAGEAL ECHOCARDIOGRAM (TEE);  Surgeon: Larey Dresser, MD;  Location: [place] [facility];  Service: Cardiovascular;  Laterality: N/A;  . TEE WITHOUT CARDIOVERSION N/A 05/10/2017   Procedure: TRANSESOPHAGEAL ECHOCARDIOGRAM (TEE);  Surgeon: Larey Dresser, MD;  Location: [facility];  Service: Cardiovascular;  Laterality: N/A;  . TONSILLECTOMY  1943  . TOTAL KNEE ARTHROPLASTY Right 2009  . TRANSESOPHAGEAL ECHOCARDIOGRAM  02/2012     reports that she quit smoking about 28 years ago. Her smoking use included Cigarettes. She has a 119.00 pack-year smoking history. She has never used smokeless tobacco. She reports that she drinks alcohol. She reports that she does not use drugs.  No Known Allergies  Family History  Problem Relation Age of Onset  . Cancer Mother 2       leukemia   . Breast cancer Neg Hx   . Colon cancer Neg Hx    Unacceptable: Noncontributory, unremarkable, or negative. Acceptable: Family history reviewed and not pertinent (If you reviewed it)  Prior to Admission medications   Medication Sig Start Date End Date Taking? Authorizing Provider  acetaminophen (TYLENOL) 325 MG tablet Take 2 tablets (650 mg total) by mouth every 6 (six) hours as needed for mild pain (or Fever >/= 101). 01/03/17  Yes Regalado, Belkys A, MD  atorvastatin (LIPITOR) 20 MG tablet Take 1 tablet (20 mg  total) by mouth daily. 02/18/17  Yes Larey Dresser, MD  Calcium Carbonate-Vit D-Min 600-400 MG-UNIT TABS Take 1 tablet by mouth daily.    Yes [provider]  Cinnamon 500 MG capsule Take 500 mg by mouth daily.     Yes [provider]  dorzolamide-timolol (COSOPT) 22.3-6.8 MG/ML ophthalmic solution Place 1 drop into both eyes 2 (two) times daily.    Yes [provider]  ferrous sulfate 325 (65 FE) MG tablet Take 325 mg by mouth daily with breakfast.   Yes [provider]  gabapentin (NEURONTIN) 300 MG capsule Take 300 mg by mouth at bedtime.    Yes [provider]  glipiZIDE (GLUCOTROL XL) 10 MG 24 hr tablet Take 1 tablet (10 mg total) by mouth daily. 10/24/15  Yes Kuneff, Renee A, DO  glucose blood (ACCU-CHEK AVIVA PLUS) test strip Use as instructed 10/28/15  Yes Kuneff, Renee A, DO  latanoprost (XALATAN) 0.005 % ophthalmic solution Place 1 drop into both eyes at bedtime.    Yes [provider]  linagliptin (TRADJENTA) 5 MG TABS tablet Take 5 mg by mouth daily.   Yes [provider]  magnesium oxide (MAG-OX) 400 MG tablet Take 400 mg by mouth daily.  Yes [provider]  metoprolol succinate (TOPROL-XL) 25 MG 24 hr tablet Take 3 tablets (75 mg total) by mouth 2 (two) times daily. 05/17/17  Yes Arbutus Leas, NP  Multiple Vitamins-Minerals (MULTIVITAL) tablet Take 1 tablet by mouth daily.     Yes [provider]  Omega-3 Fatty Acids (FISH OIL) 1000 MG CAPS Take 1 capsule by mouth daily.    Yes [provider]  pantoprazole (PROTONIX) 40 MG tablet Take 1 tablet (40 mg total) by mouth daily. 01/03/17  Yes Regalado, Belkys A, MD  potassium chloride SA (K-DUR,KLOR-CON) 20 MEQ tablet Take 2 tablets (40 mEq total) by mouth daily. 05/18/17  Yes Arbutus Leas, NP  tiotropium (SPIRIVA HANDIHALER) 18 MCG inhalation capsule Place 1 capsule (18 mcg total) into inhaler and inhale daily. 03/01/16  Yes Larey Dresser, MD    torsemide (DEMADEX) 20 MG tablet Take 4 tablets (80 mg total) by mouth 2 (two) times daily. 05/02/17  Yes Larey Dresser, MD  vitamin E 400 UNIT capsule Take 400 Units by mouth daily.     Yes [provider]  warfarin (COUMADIN) 5 MG tablet Take 5 mg daily on except on Monday and Friday take 2.5 mg. 05/17/17  Yes Arbutus Leas, NP    Physical Exam: Vitals:   05/19/2017 1530 05/18/2017 1600 05/11/2017 1701 05/13/2017 1742  BP: (!) 88/58 96/72 102/70 102/65  Pulse: 86 95 89 92  Resp: _0 Temp:      TempSrc:      SpO2: 95% 95% 94% 93%  Weight:      Height:        Constitutional: NAD, calm, comfortable Vitals:   05/11/2017 1530 05/08/2017 1600 05/10/2017 1701 05/20/2017 1742  BP: (!) 88/58 96/72 102/70 102/65  Pulse: 86 95 89 92  Resp: _1 Temp:      TempSrc:      SpO2: 95% 95% 94% 93%  Weight:      Height:       Eyes: PERRL, lids and conjunctivae normal ENMT: Mucous membranes are moist. Posterior pharynx clear of any exudate or lesions.Normal dentition.  Neck: normal, supple, no masses, no thyromegaly Respiratory: clear to auscultation bilaterally, no wheezing, no crackles. Normal respiratory effort. No accessory muscle use.  Cardiovascular: Regular rate and rhythm, no murmurs / rubs / gallops. No extremity edema. 2+ pedal pulses. No carotid bruits.  Abdomen: no tenderness, no masses palpated. No hepatosplenomegaly. Bowel sounds positive.  Musculoskeletal: no clubbing / cyanosis. No joint deformity upper and lower extremities. Good ROM, no contractures. Normal muscle tone.  Skin: no rashes, lesions, ulcers. No induration Neurologic: CN 2-12 grossly intact. Sensation intact, DTR normal. Strength 5/5 in all 4.  Psychiatric: Normal judgment and insight. Alert and oriented x 3. Normal mood.   (Anything < 9 systems with 2 bullets each down codes to level 1) (If patient refuses exam can't bill higher level) (Make sure to document decubitus ulcers present on admission --  if possible -- and whether patient has chronic indwelling catheter at time of admission)  Labs on Admission: I have personally reviewed following labs and imaging studies  CBC:  Recent Labs Lab 05/20/2017 1325 05/14/2017 1335  WBC 8.1  --   NEUTROABS 6.1  --   HGB 9.5* 10.5*  HCT 31.7* 31.0*  MCV 94.6  --   PLT 228  --    Basic Metabolic Panel:  Recent Labs Lab 05/15/2017 1325 06/02/2017 1335  05/12/2017 1617  NA 138 137 139  K 6.9* 6.8* 6.9*  CL 100* 100* 101  CO2 31  --  29  GLUCOSE 51* 45* 118*  BUN 73* 69* 75*  CREATININE 3.81* 3.90* 3.96*  CALCIUM 9.3  --  9.5   GFR: Estimated Creatinine Clearance: 9.9 mL/min (A) (by C-G formula based on SCr of 3.96 mg/dL (H)). Liver Function Tests:  Recent Labs Lab 06/04/2017 1325  AST 21  ALT 12*  ALKPHOS 48  BILITOT 0.6  PROT 7.2  ALBUMIN 3.3*   No results for input(s): LIPASE, AMYLASE in the last 168 hours. No results for input(s): AMMONIA in the last 168 hours. Coagulation Profile:  Recent Labs Lab 05/17/2017 1325  INR 2.56   Cardiac Enzymes: No results for input(s): CKTOTAL, CKMB, CKMBINDEX, TROPONINI in the last 168 hours. BNP (last 3 results) No results for input(s): PROBNP in the last 8760 hours. HbA1C: No results for input(s): HGBA1C in the last 72 hours. CBG:  Recent Labs Lab 05/18/2017 1231 05/21/2017 1320 05/10/2017 1433 05/30/2017 1519 05/20/2017 1714  GLUCAP 39* 43* 142* 160* 93   Lipid Profile: No results for input(s): CHOL, HDL, LDLCALC, TRIG, CHOLHDL, LDLDIRECT in the last 72 hours. Thyroid Function Tests: No results for input(s): TSH, T4TOTAL, FREET4, T3FREE, THYROIDAB in the last 72 hours. Anemia Panel: No results for input(s): VITAMINB12, FOLATE, FERRITIN, TIBC, IRON, RETICCTPCT in the last 72 hours. Urine analysis:    Component Value Date/Time   COLORURINE AMBER BIOCHEMICALS MAY BE AFFECTED BY COLOR (A) 09/15/2008 0935   APPEARANCEUR CLOUDY (A) 09/15/2008 0935   LABSPEC 1.031 (H) 09/15/2008 0935    PHURINE 5.5 09/15/2008 0935   GLUCOSEU NEGATIVE 09/15/2008 0935   HGBUR NEGATIVE 09/15/2008 0935   BILIRUBINUR SMALL (A) 09/15/2008 0935   KETONESUR TRACE (A) 09/15/2008 0935   PROTEINUR NEGATIVE 09/15/2008 0935   UROBILINOGEN 0.2 09/15/2008 0935   NITRITE NEGATIVE 09/15/2008 0935   LEUKOCYTESUR  09/15/2008 0935    NEGATIVE MICROSCOPIC NOT DONE ON URINES WITH NEGATIVE PROTEIN, BLOOD, LEUKOCYTES, NITRITE, OR GLUCOSE <1000 mg/dL.    Radiological Exams on Admission: Dg Chest Portable 1 View  Result Date: 05/13/2017 CLINICAL DATA:  Shortness of breath. EXAM: PORTABLE CHEST 1 VIEW COMPARISON:  CT 05/29/2017.  Chest x-ray 10/20/2015. FINDINGS: Cardiomegaly with pulmonary vascular prominence. Low lung volumes with basilar atelectasis. Mild basilar/infiltrates infiltrates. Elevation left hemidiaphragm. Small left pleural effusion. IMPRESSION: 1. Cardiomegaly with pulmonary vascular prominence, bilateral interstitial prominence, small left pleural effusion. Findings suggest mild CHF. 2. Low lung volumes with basilar atelectasis. Stable elevation left hemidiaphragm. Electronically Signed   By: Marcello Moores  Register   On: 05/23/2017 14:31    EKG: Independently reviewed.   AHigh wiping U page me about it since DVT prophylaxis assessment/Plan Active Problems:   Acute renal failure (ARF) (HCC)   Acute renal failure with hyperkalemia.    DVT prophylaxis:  Code Status:  dnr Famil and he agrees to the patient in critical dnr nephrology has been associated with no alcohol is just poor or so change ICU walking she he y Communication:  Disposition Plan: icu Consults called: Admission status:   Georgette Shell MD [facility] igh discussed with Dr. is okay pulmonary t-coverage [email or web address] Password [facility]  05/30/2017, 6:09 PM

## 2017-05-31 NOTE — Progress Notes (Signed)
Destiny [name] for Coumadin Indication: atrial fibrillation  No Known Allergies  Patient Measurements: Height: 5' (152.4 cm) Weight: 154 lb (69.9 kg) IBW/kg (Calculated) : 45.5  Vital Signs: Temp: 98 F (36.7 C) (08/24 1229) Temp Source: Oral (08/24 1229) BP: 86/56 (08/24 1900) Pulse Rate: 92 (08/24 1900)  Labs:  Recent Labs  06/05/2017 1325 05/20/2017 1335 05/16/2017 1617  HGB 9.5* 10.5*  --   HCT 31.7* 31.0*  --   PLT 228  --   --   LABPROT 28.0*  --   --   INR 2.56  --   --   CREATININE 3.81* 3.90* 3.96*    Estimated Creatinine Clearance: 9.9 mL/min (A) (by C-G formula based on SCr of 3.96 mg/dL (H)).   Medical History: Past Medical History:  Diagnosis Date  . Anemia   . Aortic calcification (HCC) 02/20/2016   Involving entire thoracic and abdominal aorta  . Arthritis   . Atrial fibrillation ([place]) 1999-diagnosed  . Complication of anesthesia    slow to awaken (05/08/2017)  . Diastolic CHF, chronic ([place]) 12/30/2014  . Edema   . Glaucoma, both eyes   . History of blood transfusion 03/2017   "related to anemia"  . History of kidney stones   . Incidental pulmonary nodule    Results of CT scan:  Lungs/Pleura: There is some progressive scarring and reticulonodular opacity in the left upper lobe and lingula extending to the anterior pleural surface. There is some associated mild traction bronchiectasis in the lingula. Findings are suggestive of postinflammatory/postinfectious changes. Recommend correlation with any active infectious symptoms. CT follow-up would be appro  . Mitral valve insufficiency and aortic valve insufficiency   . Mononeuritis of unspecified site   . Osteoarthrosis, unspecified whether generalized or localized, unspecified site    "all over" (05/08/2017)  . Pure hypercholesterolemia   . Skin cancer    left temple  . Type II diabetes mellitus ([name])   . Unspecified essential hypertension     Medications:   (Not in a  hospital admission) Scheduled:  . albuterol  2.5 mg Nebulization Q4H  . sodium polystyrene  30 g Oral Q4H  . warfarin  2.5 mg Oral Once  . [START ON Jun 18, 2017] Warfarin - Pharmacist Dosing Inpatient   Does not apply q1800    Assessment: 61 yoF with PMH HTN, DM, chronic atrial fibrillation on warfarin, chronic systolic CHF secondary to NIDCM, severe MR s/p failed MV clip, porcelain aorta, severe AS and COPD, admitted 8/24 for CHF exacerbation, hyperkalemia, AKI. Pharmacy to continue warfarin while inpatient   Baseline INR therapeutic  Prior anticoagulation: warfarin 5 mg daily except 2.5 mg Mon and Fri.  Significant events: 8/24: [facility]xfer to [facility] for cardiac ICU  Today, 05/09/2017:  CBC: hgb low at baseline, per Cards note Hx GIB in 12/2016 with low but stable hgb since. Plt wnl  INR therapeutic  Major drug interactions: none noted  No bleeding issues per nursing  Diet ordered  Goal of Therapy: INR 2-3  Plan:  Warfarin 2.5 mg PO tonight once transferred to [facility]  Daily INR  CBC at least q72 hr while on warfarin  Monitor for signs of bleeding or thrombosis   Reuel Boom, PharmD Pager: [phone] 06/05/2017, 7:27 PM

## 2017-05-31 NOTE — Progress Notes (Addendum)
Discussed case with Dr. Ashok Cordia with CCM in [place].  He agrees to accept patient to CCM service at [facility] in ICU with AHF following.  Called [facility] who will try to get bed assignment and arrange transfer.  Also, nephrology has been consulted by ER at [facility] for consult. I have discussed case with Dr. Jimmy Footman who will see patient.

## 2017-06-01 DIAGNOSIS — R570 Cardiogenic shock: Secondary | ICD-10-CM

## 2017-06-01 DIAGNOSIS — J9601 Acute respiratory failure with hypoxia: Secondary | ICD-10-CM

## 2017-06-01 DIAGNOSIS — Z515 Encounter for palliative care: Secondary | ICD-10-CM

## 2017-06-01 DIAGNOSIS — N170 Acute kidney failure with tubular necrosis: Secondary | ICD-10-CM

## 2017-06-01 DIAGNOSIS — N179 Acute kidney failure, unspecified: Secondary | ICD-10-CM

## 2017-06-01 LAB — URINALYSIS, ROUTINE W REFLEX MICROSCOPIC
BILIRUBIN URINE: NEGATIVE
GLUCOSE, UA: NEGATIVE mg/dL
KETONES UR: NEGATIVE mg/dL
Nitrite: NEGATIVE
PH: 5 (ref 5.0–8.0)
PROTEIN: 100 mg/dL — AB
Specific Gravity, Urine: 1.028 (ref 1.005–1.030)
Squamous Epithelial / LPF: NONE SEEN

## 2017-06-01 LAB — BLOOD GAS, ARTERIAL
ACID-BASE DEFICIT: 0.4 mmol/L (ref 0.0–2.0)
Bicarbonate: 26.3 mmol/L (ref 20.0–28.0)
Drawn by: 418751
O2 CONTENT: 6 L/min
O2 SAT: 84.9 %
PCO2 ART: 65.2 mmHg — AB (ref 32.0–48.0)
Patient temperature: 98.6
pH, Arterial: 7.23 — ABNORMAL LOW (ref 7.350–7.450)
pO2, Arterial: 55.9 mmHg — ABNORMAL LOW (ref 83.0–108.0)

## 2017-06-01 LAB — CBC
HEMATOCRIT: 32.7 % — AB (ref 36.0–46.0)
HEMOGLOBIN: 9.7 g/dL — AB (ref 12.0–15.0)
MCH: 28.1 pg (ref 26.0–34.0)
MCHC: 29.7 g/dL — ABNORMAL LOW (ref 30.0–36.0)
MCV: 94.8 fL (ref 78.0–100.0)
Platelets: 238 10*3/uL (ref 150–400)
RBC: 3.45 MIL/uL — AB (ref 3.87–5.11)
RDW: 18.7 % — ABNORMAL HIGH (ref 11.5–15.5)
WBC: 7.9 10*3/uL (ref 4.0–10.5)

## 2017-06-01 LAB — COMPREHENSIVE METABOLIC PANEL
ALT: 13 U/L — ABNORMAL LOW (ref 14–54)
ANION GAP: 11 (ref 5–15)
AST: 22 U/L (ref 15–41)
Albumin: 3.2 g/dL — ABNORMAL LOW (ref 3.5–5.0)
Alkaline Phosphatase: 47 U/L (ref 38–126)
BUN: 74 mg/dL — AB (ref 6–20)
CHLORIDE: 100 mmol/L — AB (ref 101–111)
CO2: 27 mmol/L (ref 22–32)
Calcium: 9.5 mg/dL (ref 8.9–10.3)
Creatinine, Ser: 4.29 mg/dL — ABNORMAL HIGH (ref 0.44–1.00)
GFR, EST AFRICAN AMERICAN: 10 mL/min — AB (ref >60)
GFR, EST NON AFRICAN AMERICAN: 9 mL/min — AB (ref >60)
Glucose, Bld: 67 mg/dL (ref 65–99)
Sodium: 138 mmol/L (ref 135–145)
TOTAL PROTEIN: 7 g/dL (ref 6.5–8.1)
Total Bilirubin: 0.7 mg/dL (ref 0.3–1.2)

## 2017-06-01 LAB — TYPE AND SCREEN
ABO/RH(D): A POS
Antibody Screen: NEGATIVE

## 2017-06-01 LAB — GLUCOSE, CAPILLARY
GLUCOSE-CAPILLARY: 119 mg/dL — AB (ref 65–99)
Glucose-Capillary: 35 mg/dL — CL (ref 65–99)

## 2017-06-01 LAB — PROTIME-INR
INR: 1.42
INR: 2.63
Prothrombin Time: 28.6 seconds — ABNORMAL HIGH (ref 11.4–15.2)
Prothrombin Time: 17.5 seconds — ABNORMAL HIGH (ref 11.4–15.2)

## 2017-06-01 LAB — LACTIC ACID, PLASMA: LACTIC ACID, VENOUS: 2.2 mmol/L — AB (ref 0.5–1.9)

## 2017-06-01 LAB — TROPONIN I: TROPONIN I: 0.03 ng/mL — AB (ref <0.03)

## 2017-06-01 LAB — MRSA PCR SCREENING: MRSA by PCR: NEGATIVE

## 2017-06-01 MED ORDER — CHLORHEXIDINE GLUCONATE CLOTH 2 % EX PADS: 6.0000 | MEDICATED_PAD | Freq: Every day | CUTANEOUS | Status: DC

## 2017-06-01 MED ORDER — PROTHROMBIN COMPLEX CONC HUMAN 500 UNITS IV KIT
2154.0000 [IU] | PACK | Status: AC
  Administered 2017-06-01: 2154 [IU] via INTRAVENOUS
  Filled 2017-06-01: qty 86

## 2017-06-01 MED ORDER — POLYVINYL ALCOHOL 1.4 % OP SOLN
1.0000 [drp] | Freq: Four times a day (QID) | OPHTHALMIC | Status: DC | PRN
  Filled 2017-06-01: qty 15

## 2017-06-01 MED ORDER — SODIUM CHLORIDE 0.9% FLUSH: 10.0000 mL | Freq: Two times a day (BID) | INTRAVENOUS | Status: DC

## 2017-06-01 MED ORDER — LEVALBUTEROL HCL 0.63 MG/3ML IN NEBU
INHALATION_SOLUTION | RESPIRATORY_TRACT | Status: AC
  Filled 2017-06-01: qty 3

## 2017-06-01 MED ORDER — GLYCOPYRROLATE 0.2 MG/ML IJ SOLN: 0.2000 mg | INTRAMUSCULAR | Status: DC | PRN

## 2017-06-01 MED ORDER — MIDAZOLAM HCL 2 MG/2ML IJ SOLN
5.0000 mg | Freq: Once | INTRAMUSCULAR | Status: DC
  Filled 2017-06-01: qty 6

## 2017-06-01 MED ORDER — SODIUM CHLORIDE 0.9 % IV SOLN
5.0000 mg/h | INTRAVENOUS | Status: DC
  Filled 2017-06-01: qty 10

## 2017-06-01 MED ORDER — GLYCOPYRROLATE 1 MG PO TABS
1.0000 mg | ORAL_TABLET | ORAL | Status: DC | PRN
  Filled 2017-06-01: qty 1

## 2017-06-01 MED ORDER — DEXTROSE 50 % IV SOLN
1.0000 | Freq: Once | INTRAVENOUS | Status: AC
  Administered 2017-06-01: 50 mL via INTRAVENOUS

## 2017-06-01 MED ORDER — SODIUM CHLORIDE 0.9% FLUSH: 10.0000 mL | INTRAVENOUS | Status: DC | PRN

## 2017-06-01 MED ORDER — FENTANYL CITRATE (PF) 100 MCG/2ML IJ SOLN
200.0000 ug | Freq: Once | INTRAMUSCULAR | Status: AC
  Administered 2017-06-01: 200 ug via INTRAVENOUS
  Filled 2017-06-01: qty 4

## 2017-06-01 MED ORDER — DEXTROSE 50 % IV SOLN
INTRAVENOUS | Status: AC
  Filled 2017-06-01: qty 50

## 2017-06-01 MED ORDER — ONDANSETRON HCL 4 MG/2ML IJ SOLN: 4.0000 mg | Freq: Four times a day (QID) | INTRAMUSCULAR | Status: DC | PRN

## 2017-06-01 MED ORDER — MIDAZOLAM HCL 5 MG/ML IJ SOLN: 5.0000 mg | Freq: Once | INTRAMUSCULAR | Status: DC

## 2017-06-01 MED ORDER — LEVALBUTEROL HCL 0.63 MG/3ML IN NEBU
0.6300 mg | INHALATION_SOLUTION | Freq: Three times a day (TID) | RESPIRATORY_TRACT | Status: DC
  Administered 2017-06-01: 0.63 mg via RESPIRATORY_TRACT

## 2017-06-01 MED ORDER — VITAMIN K1 10 MG/ML IJ SOLN: 10.0000 mg | INTRAVENOUS | Status: DC

## 2017-06-01 MED ORDER — PHENTOLAMINE MESYLATE 5 MG IJ SOLR
5.0000 mg | Freq: Once | INTRAMUSCULAR | Status: AC
  Administered 2017-06-01: 5 mg via SUBCUTANEOUS
  Filled 2017-06-01: qty 5

## 2017-06-01 MED ORDER — FENTANYL 2500MCG IN NS 250ML (10MCG/ML) PREMIX INFUSION
200.0000 ug/h | INTRAVENOUS | Status: DC
  Filled 2017-06-01: qty 250

## 2017-06-01 MED ORDER — ONDANSETRON 4 MG PO TBDP: 4.0000 mg | ORAL_TABLET | Freq: Four times a day (QID) | ORAL | Status: DC | PRN

## 2017-06-01 MED ORDER — FENTANYL CITRATE (PF) 100 MCG/2ML IJ SOLN
25.0000 ug | Freq: Once | INTRAMUSCULAR | Status: AC
  Administered 2017-06-01: 25 ug via INTRAVENOUS
  Filled 2017-06-01: qty 2

## 2017-06-04 ENCOUNTER — Encounter: Payer: Medicare HMO | Admitting: Thoracic Surgery (Cardiothoracic Vascular Surgery)

## 2017-06-05 ENCOUNTER — Telehealth: Payer: Self-pay

## 2017-06-05 NOTE — Telephone Encounter (Signed)
On 06/05/2017 I received a death certificate from [place] ([place] original). The death certificate is for burial. The patient is a patient of Doctor Nelda Marseille. The death certificate will be taken to [facility] this pm for signature.  On June 29, 2017 I received the death certificate back from Doctor Nelda Marseille. I got the death certificate ready and called the funeral home to let them know I mailed the death certificate to [employer] per the funeral home request.

## 2017-06-08 NOTE — Procedures (Signed)
Central Venous Catheter Insertion Procedure Note KIYANI JERNIGAN [id number] 18-Dec-1934  Procedure: Insertion of Central Venous Catheter Indications: CRRT  Procedure Details Consent: Risks of procedure as well as the alternatives and risks of each were explained to the (patient/caregiver).  Consent for procedure obtained. Time Out: Verified patient identification, verified procedure, site/side was marked, verified correct patient position, special equipment/implants available, medications/allergies/relevent history reviewed, required imaging and test results available.  Performed  Maximum sterile technique was used including antiseptics, cap, gloves, gown, hand hygiene, mask and sheet. Skin prep: Chlorhexidine; local anesthetic administered A antimicrobial bonded/coated triple lumen catheter was placed in the left femoral vein due to emergent situation using the Seldinger technique.  Evaluation Blood flow good Complications: No apparent complications Patient did tolerate procedure well. Chest X-ray ordered to verify placement.  CXR: femoral line.  Charlesetta Garibaldi 26-Jun-2017, 4:43 AM  Patient anxious, restless, hypotensive, coagulopathic - neck line poor choice.

## 2017-06-08 NOTE — Progress Notes (Signed)
Pt pronounced dead at 1055, absence of pulse for 1 minute auscultation verified by Virgie Dad , RN  And Etta Quill ,RN. Dr. Haroldine Laws made aware. Etta Quill

## 2017-06-08 NOTE — Progress Notes (Signed)
Advanced Heart Failure Rounding Note   Subjective:    Admitted overnight with cardiogenic shock and ARF. Now on CRRT.   Dyspneic at rest. Refusing bipap. Now on FM. On norepi 2.    Objective:   Weight Range:  Vital Signs:   Temp:  [97 F (36.1 C)-98 F (36.7 C)] 97 F (36.1 C) (08/25 0800) Pulse Rate:  [32-125] 120 (08/25 0800) Resp:  [9-25] 18 (08/25 0800) BP: (83-225)/(34-197) 129/91 (08/25 0800) SpO2:  [63 %-100 %] 97 % (08/25 0859) FiO2 (%):  [100 %] 100 % (08/25 0859) Weight:  [69.9 kg (154 lb)-75.2 kg (165 lb 12.6 oz)] 75.2 kg (165 lb 12.6 oz) (08/25 0500) Last BM Date: 05/18/2017  Weight change: Filed Weights   05/23/2017 1401 05/19/2017 2315 28-Jun-2017 0500  Weight: 69.9 kg (154 lb) 75.1 kg (165 lb 9.1 oz) 75.2 kg (165 lb 12.6 oz)    Intake/Output:   Intake/Output Summary (Last 24 hours) at 06/28/17 1002 Last data filed at 06/28/2017 0900  Gross per 24 hour  Intake           431.38 ml  Output              314 ml  Net           117.38 ml     Physical Exam: General:  Lethargic but awake. Dyspneic at rest. On FM  HEENT: normal Neck: supple. JVP jaw .  Cor: PMI nondisplaced. IRR tachy. 2/6 AS 2/6 MR  Lungs: crackles Abdomen: soft, nontender, nondistended. No hepatosplenomegaly. No bruits or masses. Good bowel sounds. Extremities: no cyanosis, clubbing, rash, 2+ edema left femoral trialysis cath Neuro: lethargic orientedx3, cranial nerves grossly intact. moves all 4 extremities w/o difficulty. Affect flat  Telemetry: AF 120s Personally reviewed   Labs: Basic Metabolic Panel:  Recent Labs Lab 05/30/2017 1325 05/19/2017 1335 05/17/2017 1617 28-Jun-2017 0050  NA 138 137 139 138  K 6.9* 6.8* 6.9* >7.5*  CL 100* 100* 101 100*  CO2 31  --  29 27  GLUCOSE 51* 45* 118* 67  BUN 73* 69* 75* 74*  CREATININE 3.81* 3.90* 3.96* 4.29*  CALCIUM 9.3  --  9.5 9.5    Liver Function Tests:  Recent Labs Lab 06/05/2017 1325 2017/06/28 0050  AST 21 22  ALT 12* 13*    ALKPHOS 48 47  BILITOT 0.6 0.7  PROT 7.2 7.0  ALBUMIN 3.3* 3.2*   No results for input(s): LIPASE, AMYLASE in the last 168 hours. No results for input(s): AMMONIA in the last 168 hours.  CBC:  Recent Labs Lab 05/14/2017 1325 05/16/2017 1335 06-28-2017 0050  WBC 8.1  --  7.9  NEUTROABS 6.1  --   --   HGB 9.5* 10.5* 9.7*  HCT 31.7* 31.0* 32.7*  MCV 94.6  --  94.8  PLT 228  --  238    Cardiac Enzymes:  Recent Labs Lab 06-28-2017 0050  TROPONINI 0.03*    BNP: BNP (last 3 results)  Recent Labs  05/08/17 1247 05/08/17 1612 05/12/2017 1325  BNP 685.7* 863.4* 544.0*    ProBNP (last 3 results) No results for input(s): PROBNP in the last 8760 hours.    Other results:  Imaging: US Renal  Result Date: 05/28/2017 CLINICAL DATA:  Acute kidney injury. EXAM: RENAL / URINARY TRACT ULTRASOUND COMPLETE COMPARISON:  CT 04/26/2016 FINDINGS: Right Kidney: Length: 12.6 cm. Echogenicity within normal limits. Mild thinning of the renal parenchyma. No mass or hydronephrosis visualized. Left Kidney: Length: 10.9  cm. No hydronephrosis. Shadowing stone in the lower pole measures 9 mm. 2.9 cm cyst in the lower pole. No evidence of solid lesion. Bladder: Not visualized likely nondistended. IMPRESSION: 1. No obstructive uropathy. 2. Nonobstructing left renal stone.  Left renal cyst. Electronically Signed   By: Jeb Levering M.D.   On: 05/13/2017 21:28   Dg Chest Portable 1 View  Result Date: 05/20/2017 CLINICAL DATA:  Shortness of breath. EXAM: PORTABLE CHEST 1 VIEW COMPARISON:  CT 05/29/2017.  Chest x-ray 10/20/2015. FINDINGS: Cardiomegaly with pulmonary vascular prominence. Low lung volumes with basilar atelectasis. Mild basilar/infiltrates infiltrates. Elevation left hemidiaphragm. Small left pleural effusion. IMPRESSION: 1. Cardiomegaly with pulmonary vascular prominence, bilateral interstitial prominence, small left pleural effusion. Findings suggest mild CHF. 2. Low lung volumes with basilar  atelectasis. Stable elevation left hemidiaphragm. Electronically Signed   By: Marcello Moores  Register   On: 05/19/2017 14:31      Medications:     Scheduled Medications: . Chlorhexidine Gluconate Cloth  6 each Topical Daily  . fentaNYL (SUBLIMAZE) injection  200 mcg Intravenous Once  . levalbuterol  0.63 mg Nebulization TID  . midazolam  5 mg Intravenous Once  . sodium chloride flush  10-40 mL Intracatheter Q12H     Infusions: . fentaNYL infusion INTRAVENOUS    . midazolam (VERSED) infusion       PRN Medications:  glycopyrrolate **OR** glycopyrrolate **OR** glycopyrrolate, ondansetron **OR** ondansetron (ZOFRAN) IV, polyvinyl alcohol, sodium chloride flush   Assessment:   82 y/o woman with severe AS, COPD, MR and porcelain aorta admitted with cardiogenic shock, ARFm hyperkalemia and renal failure  Plan/Discussion:    1. Cardiogenic shock 2. Critical aortic stenosis 3. Acute renal failure 4. Acute respiratory failure 5. Hyperkalemia 6. COPD 7. Severe MR  8. DNR  She is critically ill with multisystem organ failure. Now on CRRT. Respiratory status very tenuous. ABG with pH 7.2. Refusing bipap. Wants to be full DNR.    I discussed her case with TAVR team. Given comorbidities and porcelain aorta she is not good candidate for TAVR (already failed MV clip).   Although she is lethargic, she is clearly able to comprehend and have a discussion. We discussed the fact that even if we are able to improve her current status, without the ability to fix her AoV, this will only happen again.   She told me that she is uncomfortable and is now "done" and does not want any more aggressive therapy. We discussed the possibility of stopping CRRT and switching to full comfort care which will likely lead to in-hospital death. She wants to proceed with this. I spoke with her daughter and her friend by phone who agree.   Will stop CRRT. Initiate fentanyl/versed drips.   CRITICAL CARE Performed  by: Glori Bickers  Total critical care time: 45 minutes  Critical care time was exclusive of separately billable procedures and treating other patients.   Critical care was time spent personally by me (independent of midlevel providers or residents) on the following activities: development of treatment plan with patient and/or surrogate as well as nursing, discussions with consultants, evaluation of patient's response to treatment, examination of patient, obtaining history from patient or surrogate, ordering and performing treatments and interventions, ordering and review of laboratory studies, ordering and review of radiographic studies, pulse oximetry and re-evaluation of patient's condition.   Glori Bickers, MD  10:14 AM Advanced Heart Failure Team Pager [phone] (M-F; 7a - 4p)  Please contact [facility] for night-coverage after hours (4p -  7a ) and weekends on [email or web address]

## 2017-06-08 NOTE — Death Summary Note (Signed)
Destiny [name] was a 82 y.o. female who presented to [facility] ER on 05/16/2017.  She was being assessed for TAVR.  She was found to have low blood pressure at home by home health nurse.  She was also dizzy and short of breath.  She was found to have low blood sugar.  In the ER she was found to have elevated potassium and acute renal failure.  She was seen by cardiology and nephrology.  She was transferred to [facility].  CRRT was started.  After assessment by heart failure team it was determined that she would not be a candidate for TAVR.  As such, the patient opted for DNR status and transition to comfort measures.  She expired on 2017-06-08 at 10:55 AM.  Final diagnoses: Acute on chronic systolic CHF with non ischemic CM Acute on chronic diastolic CHF Permanent Atrial fibrillation Severe mitral regurgitation Severe aortic stenosis WHO group 2 and 3 pulmonary hypertension Hyperkalemia Acute renal failure 2nd to ATN CKD 3 Metabolic acidosis with lactic acidosis Hypoglycemia Anemia of critical illness and chronic disease Acute hypoxic, hypercapnic respiratory failure Hx of COPD Hx of Hypertension Hx of DM type II Hx of upper GI bleeding  Chesley Mires, MD [facility] [address] Pulmonary/Critical Care 06/05/2017, 11:31 AM

## 2017-06-08 NOTE — Progress Notes (Signed)
Pt took bipap off at 0800 stating she did not want it. I told her it was because her oxygen was low and she still didn't want it. Pt is full DNR, placed NRB on pt and pt tolerating that. Etta Quill

## 2017-06-08 NOTE — Consult Note (Signed)
PULMONARY / CRITICAL CARE MEDICINE   Name: Destiny [name] MRN: [id number] DOB: 1935-08-14    ADMISSION DATE:  06/04/2017 CONSULTATION DATE:  2017/06/27  REFERRING MD:  Dr. Zigmund Daniel   CHIEF COMPLAINT:  Acute on Chronic HF  HISTORY OF PRESENT ILLNESS:   82 year old female with PMH of chronic systolic heart failure, severe MR, COPD severe aortic stenosis, DM 2   Presents to ED 8/24 from home with hypotension. Found in ED to have acute kidney injury with creatinine of 3.8 and potassium of 6.8. Was given calcium gluconate and kayexalate, Potassium increased to 7.5. Patient states she is a full DNR. However is willing to try short term dialysis. Nephrology consulted and patient transferred to [facility] to start on CRRT.   PAST MEDICAL HISTORY :  She  has a past medical history of Anemia; Aortic calcification (HCC) (02/20/2016); Arthritis; Atrial fibrillation ([place]) (1999-diagnosed); Complication of anesthesia; Diastolic CHF, chronic ([place]) (12/30/2014); Edema; Glaucoma, both eyes; History of blood transfusion (03/2017); History of kidney stones; Incidental pulmonary nodule; Mitral valve insufficiency and aortic valve insufficiency; Mononeuritis of unspecified site; Osteoarthrosis, unspecified whether generalized or localized, unspecified site; Pure hypercholesterolemia; Skin cancer; Type II diabetes mellitus ([place]); and Unspecified essential hypertension.  PAST SURGICAL HISTORY: She  has a past surgical history that includes Tonsillectomy (1943); Appendectomy (1954); transesophageal echocardiogram (02/2012); Holmium laser application (Right, 7/85/8850); Cystoscopy w/ retrogrades (Right, 11/26/2012); Cardiac catheterization (N/A, 11/16/2015); TEE without cardioversion (N/A, 12/08/2015); Esophagogastroduodenoscopy (egd) with propofol (N/A, 01/03/2017); Total knee arthroplasty (Right, 2009); Skin cancer excision (Left); Cataract extraction w/ intraocular lens implant (Left); Abdominal hysterectomy (2000); TEE  without cardioversion (N/A, 05/10/2017); and RIGHT/LEFT HEART CATH AND CORONARY ANGIOGRAPHY (N/A, 05/15/2017).  No Known Allergies  No current facility-administered medications on file prior to encounter.    Current Outpatient Prescriptions on File Prior to Encounter  Medication Sig  . acetaminophen (TYLENOL) 325 MG tablet Take 2 tablets (650 mg total) by mouth every 6 (six) hours as needed for mild pain (or Fever >/= 101).  Marland Kitchen atorvastatin (LIPITOR) 20 MG tablet Take 1 tablet (20 mg total) by mouth daily.  . Calcium Carbonate-Vit D-Min 600-400 MG-UNIT TABS Take 1 tablet by mouth daily.   . Cinnamon 500 MG capsule Take 500 mg by mouth daily.    . dorzolamide-timolol (COSOPT) 22.3-6.8 MG/ML ophthalmic solution Place 1 drop into both eyes 2 (two) times daily.   . ferrous sulfate 325 (65 FE) MG tablet Take 325 mg by mouth daily with breakfast.  . gabapentin (NEURONTIN) 300 MG capsule Take 300 mg by mouth at bedtime.   Marland Kitchen glipiZIDE (GLUCOTROL XL) 10 MG 24 hr tablet Take 1 tablet (10 mg total) by mouth daily.  Marland Kitchen glucose blood (ACCU-CHEK AVIVA PLUS) test strip Use as instructed  . latanoprost (XALATAN) 0.005 % ophthalmic solution Place 1 drop into both eyes at bedtime.   Marland Kitchen linagliptin (TRADJENTA) 5 MG TABS tablet Take 5 mg by mouth daily.  . magnesium oxide (MAG-OX) 400 MG tablet Take 400 mg by mouth daily.  . metoprolol succinate (TOPROL-XL) 25 MG 24 hr tablet Take 3 tablets (75 mg total) by mouth 2 (two) times daily.  . Multiple Vitamins-Minerals (MULTIVITAL) tablet Take 1 tablet by mouth daily.    . Omega-3 Fatty Acids (FISH OIL) 1000 MG CAPS Take 1 capsule by mouth daily.   . pantoprazole (PROTONIX) 40 MG tablet Take 1 tablet (40 mg total) by mouth daily.  . potassium chloride SA (K-DUR,KLOR-CON) 20 MEQ tablet Take 2 tablets (40  mEq total) by mouth daily.  Marland Kitchen tiotropium (SPIRIVA HANDIHALER) 18 MCG inhalation capsule Place 1 capsule (18 mcg total) into inhaler and inhale daily.  Marland Kitchen torsemide (DEMADEX)  20 MG tablet Take 4 tablets (80 mg total) by mouth 2 (two) times daily.  . vitamin E 400 UNIT capsule Take 400 Units by mouth daily.    Marland Kitchen warfarin (COUMADIN) 5 MG tablet Take 5 mg daily on except on Monday and Friday take 2.5 mg.    FAMILY HISTORY:  Her indicated that her mother is deceased. She indicated that her father is deceased. She indicated that her sister is alive. She indicated that the status of her neg hx is unknown.    SOCIAL HISTORY: She  reports that she quit smoking about 28 years ago. Her smoking use included Cigarettes. She has a 119.00 pack-year smoking history. She has never used smokeless tobacco. She reports that she drinks alcohol. She reports that she does not use drugs.  REVIEW OF SYSTEMS:   All negative; except for those that are bolded, which indicate positives.  Constitutional: weight loss, weight gain, night sweats, fevers, chills, fatigue, weakness.  HEENT: headaches, sore throat, sneezing, nasal congestion, post nasal drip, difficulty swallowing, tooth/dental problems, visual complaints, visual changes, ear aches. Neuro: difficulty with speech, weakness, numbness, ataxia. CV:  chest pain, orthopnea, PND, swelling in lower extremities, dizziness, palpitations, syncope.  Resp: cough, hemoptysis, dyspnea, wheezing. GI: heartburn, indigestion, abdominal pain, nausea, vomiting, diarrhea, constipation, change in bowel habits, loss of appetite, hematemesis, melena, hematochezia.  GU: dysuria, change in color of urine, urgency or frequency, flank pain, hematuria. MSK: joint pain or swelling, decreased range of motion. Psych: change in mood or affect, depression, anxiety, suicidal ideations, homicidal ideations. Skin: rash, itching, bruising.   SUBJECTIVE:  States she continues to have chronic back pain  VITAL SIGNS: BP (!) 129/91   Pulse (!) 120   Temp (!) 97 F (36.1 C) (Oral)   Resp 18   Ht 5' (1.524 m)   Wt 75.2 kg (165 lb 12.6 oz)   SpO2 97%   BMI  32.38 kg/m   HEMODYNAMICS:    VENTILATOR SETTINGS: FiO2 (%):  [100 %] 100 %  INTAKE / OUTPUT: I/O last 3 completed shifts: In: 431.4 [I.V.:71.4; IV Piggyback:360] Out: 219 [Urine:185; Other:34]  PHYSICAL EXAMINATION: General:  Elderly female, mod respiratory distress  Neuro:  Alert, oriented, follows commands  HEENT:  Dry MM  Cardiovascular:  Tachy, Irregular, no MRG Lungs:  Labored, clear breath sounds Abdomen:  Non-distended, active bowel sounds  Musculoskeletal:  -edema  Skin:  Warm, dry, intact   LABS:  BMET  Recent Labs Lab 05/19/2017 1325 05/25/2017 1335 05/28/2017 1617 06-24-2017 0050  NA 138 137 139 138  K 6.9* 6.8* 6.9* >7.5*  CL 100* 100* 101 100*  CO2 31  --  29 27  BUN 73* 69* 75* 74*  CREATININE 3.81* 3.90* 3.96* 4.29*  GLUCOSE 51* 45* 118* 67    Electrolytes  Recent Labs Lab 05/30/2017 1325 05/17/2017 1617 June 24, 2017 0050  CALCIUM 9.3 9.5 9.5    CBC  Recent Labs Lab 05/19/2017 1325 05/28/2017 1335 2017-06-24 0050  WBC 8.1  --  7.9  HGB 9.5* 10.5* 9.7*  HCT 31.7* 31.0* 32.7*  PLT 228  --  238    Coag's  Recent Labs Lab 06/05/2017 1325 06/24/2017 0050 June 24, 2017 0518  INR 2.56 2.63 1.42    Sepsis Markers  Recent Labs Lab 05/31/17 1335 05/31/17 1611 June 24, 2017 0050  LATICACIDVEN 2.20*  2.65* 2.2*    ABG  Recent Labs Lab July 01, 2017 0630  PHART 7.230*  PCO2ART 65.2*  PO2ART 55.9*    Liver Enzymes  Recent Labs Lab 06/03/2017 1325 07/01/17 0050  AST 21 22  ALT 12* 13*  ALKPHOS 48 47  BILITOT 0.6 0.7  ALBUMIN 3.3* 3.2*    Cardiac Enzymes  Recent Labs Lab 07-01-17 0050  TROPONINI 0.03*    Glucose  Recent Labs Lab 05/16/2017 1714 05/25/2017 2016 05/08/2017 2129 05/09/2017 2332 2017/07/01 0356 Jul 01, 2017 0500  GLUCAP 93 53* 115* 79 35* 119*    Imaging US Renal  Result Date: 05/10/2017 CLINICAL DATA:  Acute kidney injury. EXAM: RENAL / URINARY TRACT ULTRASOUND COMPLETE COMPARISON:  CT 04/26/2016 FINDINGS: Right Kidney: Length:  12.6 cm. Echogenicity within normal limits. Mild thinning of the renal parenchyma. No mass or hydronephrosis visualized. Left Kidney: Length: 10.9 cm. No hydronephrosis. Shadowing stone in the lower pole measures 9 mm. 2.9 cm cyst in the lower pole. No evidence of solid lesion. Bladder: Not visualized likely nondistended. IMPRESSION: 1. No obstructive uropathy. 2. Nonobstructing left renal stone.  Left renal cyst. Electronically Signed   By: Jeb Levering M.D.   On: 05/13/2017 21:28   Dg Chest Portable 1 View  Result Date: 05/30/2017 CLINICAL DATA:  Shortness of breath. EXAM: PORTABLE CHEST 1 VIEW COMPARISON:  CT 05/29/2017.  Chest x-ray 10/20/2015. FINDINGS: Cardiomegaly with pulmonary vascular prominence. Low lung volumes with basilar atelectasis. Mild basilar/infiltrates infiltrates. Elevation left hemidiaphragm. Small left pleural effusion. IMPRESSION: 1. Cardiomegaly with pulmonary vascular prominence, bilateral interstitial prominence, small left pleural effusion. Findings suggest mild CHF. 2. Low lung volumes with basilar atelectasis. Stable elevation left hemidiaphragm. Electronically Signed   By: Marcello Moores  Register   On: 05/24/2017 14:31     STUDIES:  CXR 8/24 >Cardiomegaly with pulmonary vascular prominence, bilateral interstitial prominence, small left pleural effusion. Low lung volumes with basilar atelectasis, stable elevation of let  Hemidiaphragm  US Renal 8/24 > No obstructive uropathy, nonobstructing left renal stone. Left renal cyst   CULTURES: MRSA PCP 8/24 > Negative   ANTIBIOTICS: None.   SIGNIFICANT EVENTS: 8/24 > Presents to ED hypotensive   LINES/TUBES: PIV Left Femoral HD Catheter 8/25 >>   DISCUSSION: 82 year old female with extensive cardiac history presents to ED hypotensive in acute renal failure. Transferred to [facility] for CRRT   ASSESSMENT / PLAN:  Acute Hypoxic Respiratory Failure in setting of pleural effusion  COPD severe aortic stenosis  Acute on  Chronic Systolic Heart Failure  Cardiogenic Shock  Severe MR Acute Renal Failure  Hyperkalemia  Anemia of Chronic disease  DM  Patient is a full DNR, last night was okay with short term dialysis in the hopes of resolving acute kidney injury. However after speaking with Cardiology, understanding that she would most likely not survive TAVR, she is ready to convert to full comfort care measures. Plan to stop CRRT this morning and place on full comfort measures.   Plan  -D/C CRRT -D/C Lab Draws  -D/C glucose Checks  -Will start fentanyl gtt when family arrives  -PRN Robinul, Zofran, Eye Drops    Hayden Pedro, AGACNP-BC [place] [facility]  Pgr: [phone]  PCCM Pgr: [phone]  Attending Note:  82 year old female with extensive cardiac medical history presenting with acute pulmonary edema and heart failure and now respiratory failure and cardiogenic shock.  Developed renal failure due to shock.  Spoke with the patient extensively.  Wishes to proceed with comfort  care.  Will not intubate.  Start fentanyl and versed and make full DNR.    The patient is critically ill with multiple organ systems failure and requires high complexity decision making for assessment and support, frequent evaluation and titration of therapies, application of advanced monitoring technologies and extensive interpretation of multiple databases.   Critical Care Time devoted to patient care services described in this note is  35  Minutes. This time reflects time of care of this signee Dr Jennet Maduro. This critical care time does not reflect procedure time, or teaching time or supervisory time of PA/NP/Med student/Med Resident etc but could involve care discussion time.  Rush Farmer, M.D. [facility] Pulmonary/Critical Care Medicine. Pager: [phone]. After hours pager: [phone].

## 2017-06-08 DEATH — deceased

## 2017-06-10 LAB — ECHO TEE
AO mean calculated velocity dopler: 262 cm/s
AOPV: 0.21 m/s
AV Area VTI index: 0.24 cm2/m2
AV Area mean vel: 0.57 cm2
AV Mean grad: 31 mmHg
AV Peak grad: 48 mmHg
AV area mean vel ind: 0.31 cm2/m2
AV vel: 0.44
AVAREAVTI: 0.67 cm2
AVCELMEANRAT: 0.18
AVPKVEL: 348 cm/s
CHL CUP AV PEAK INDEX: 0.37
LDCA: 3.14 cm2
LVOT SV: 45 mL
LVOT VTI: 14.4 cm
LVOT diameter: 20 mm
LVOT peak grad rest: 2 mmHg
LVOT peak vel: 73.9 cm/s
LVOTVTI: 0.14 cm
VTI: 103 cm
Valve area index: 0.24
Valve area: 0.44 cm2

## 2017-12-01 IMAGING — US US RENAL
1 series · 14 of 23 positions shown · non-contrast
Comparison: CT 04/26/2016

CLINICAL DATA: Acute kidney injury.

EXAM:
RENAL / URINARY TRACT ULTRASOUND COMPLETE

[Series 1: us renal · 0.26mm/px · 14 of 23 slices shown]
[im 1/23]
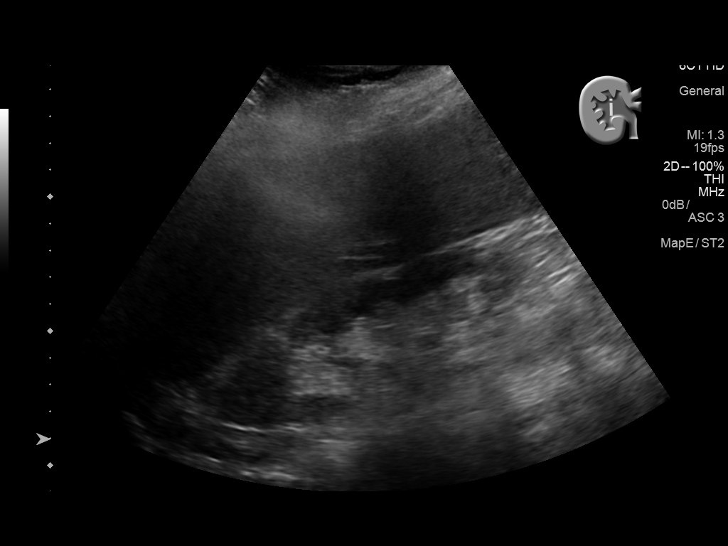
[im 3/23]
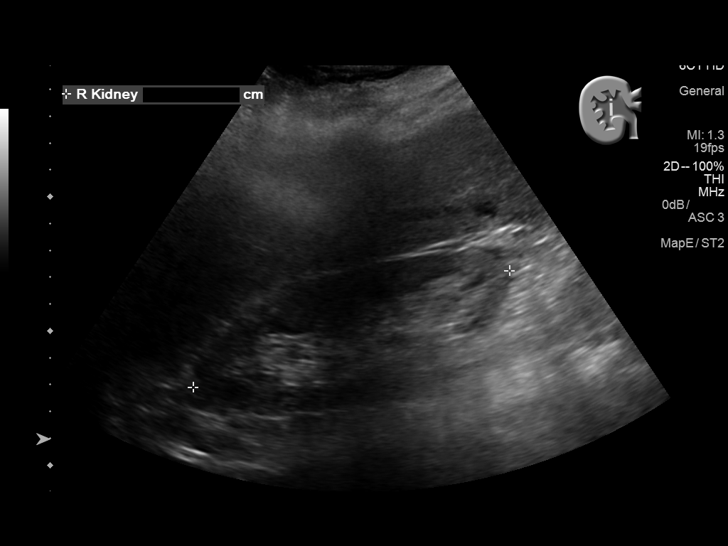
[im 5/23]
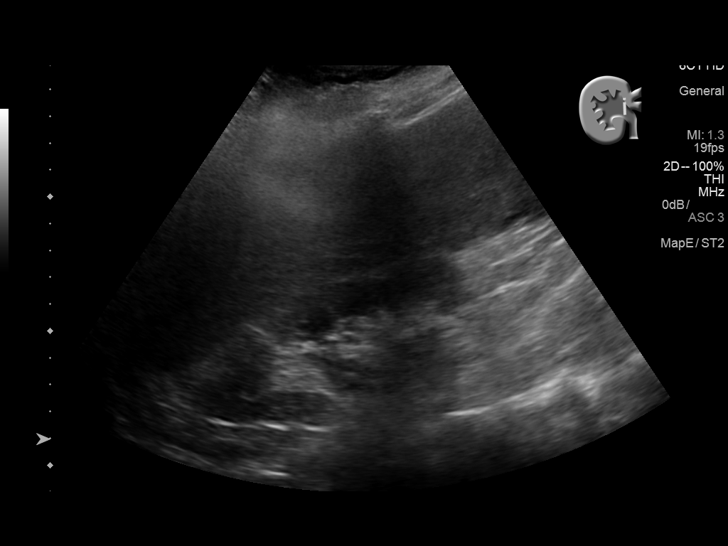
[im 6/23]
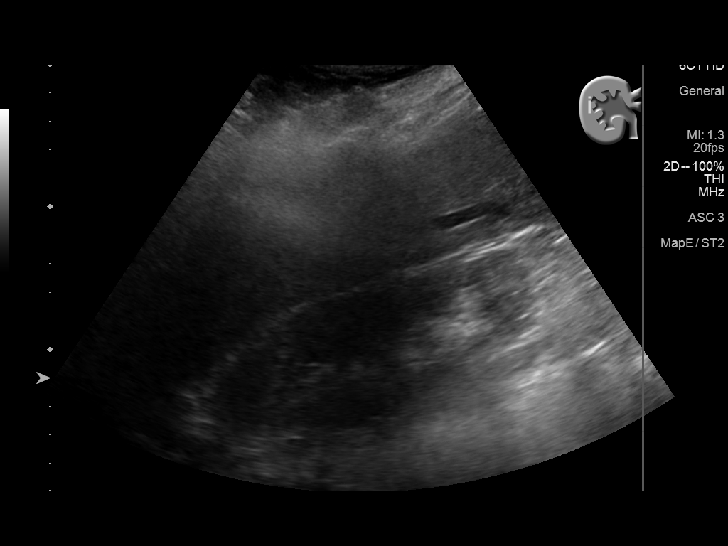
[im 8/23]
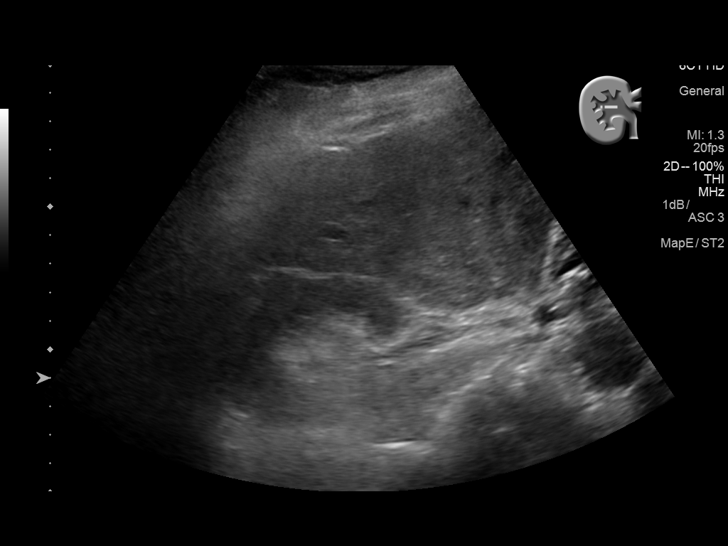
[im 10/23]
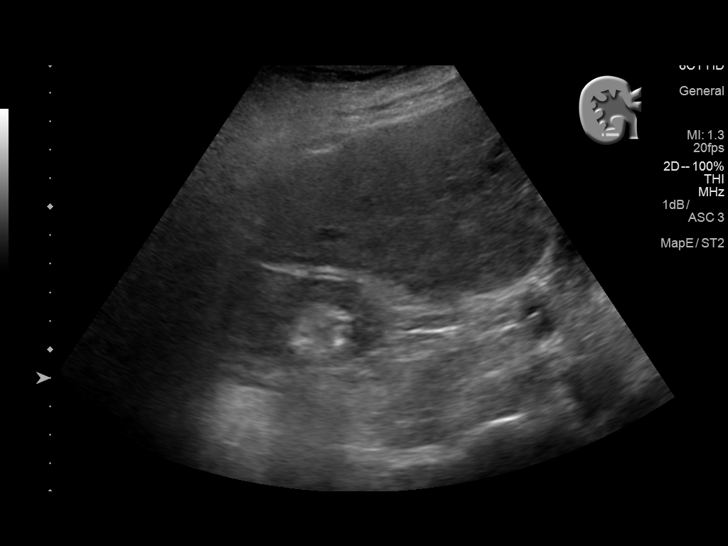
[im 11/23]
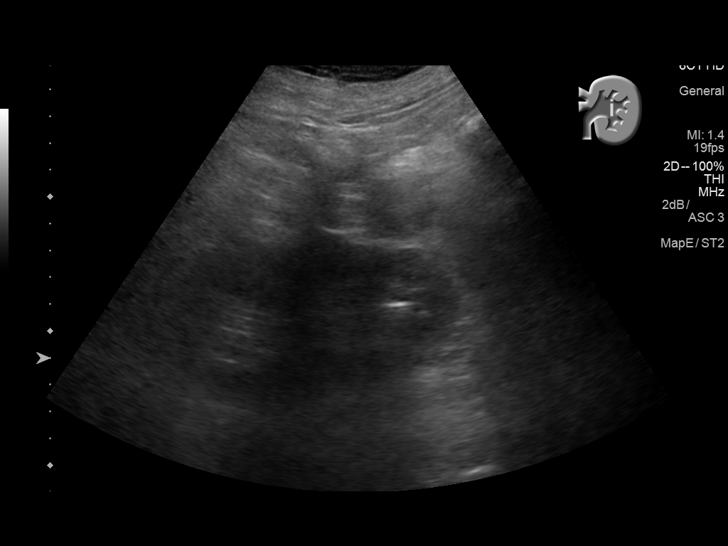
[im 13/23]
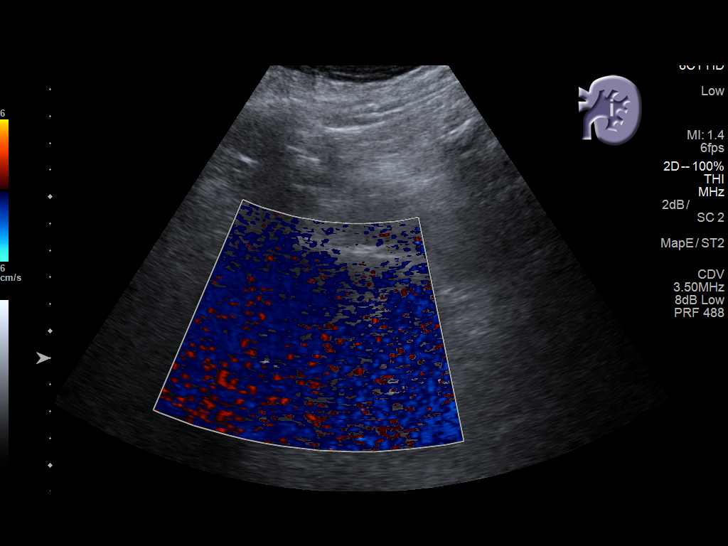
[im 14/23]
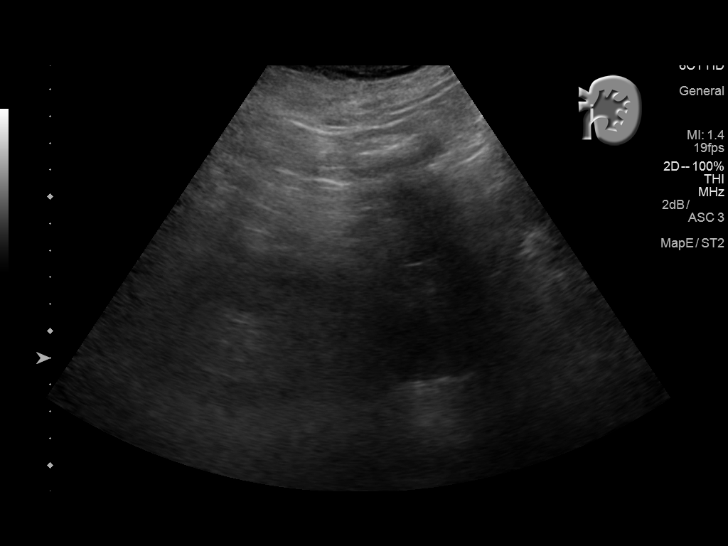
[im 16/23]
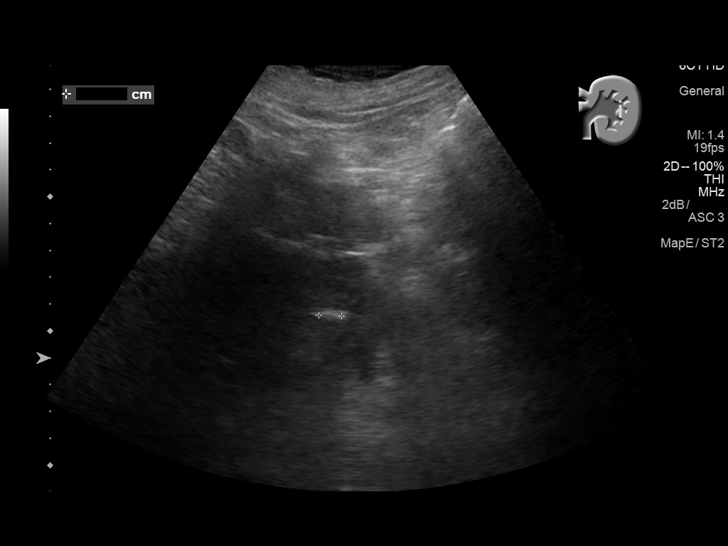
[im 18/23]
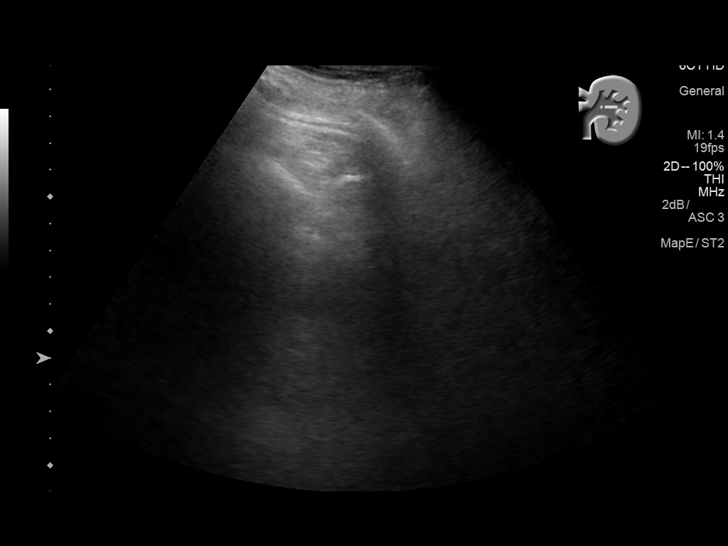
[im 19/23]
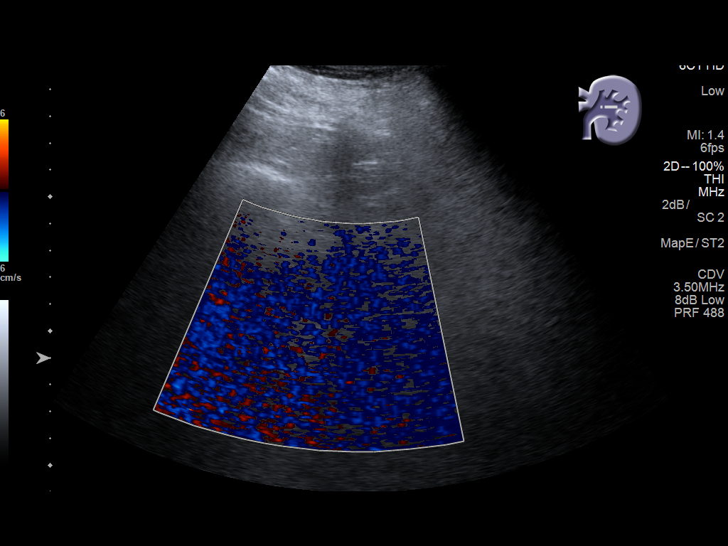
[im 21/23]
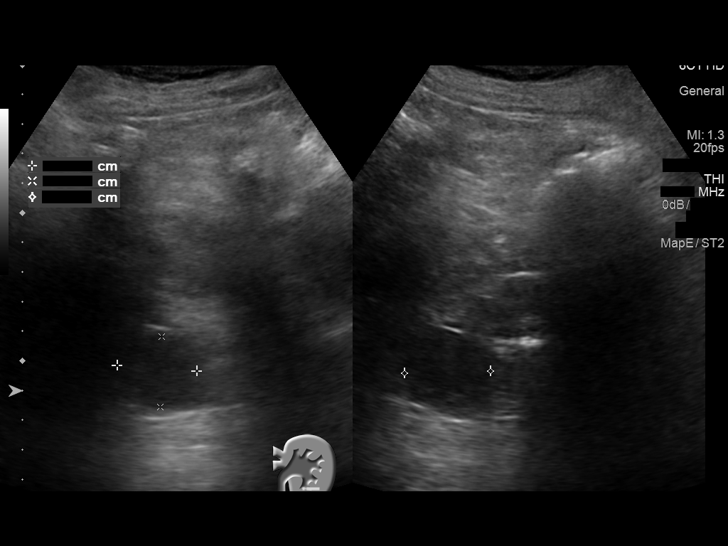
[im 23/23]
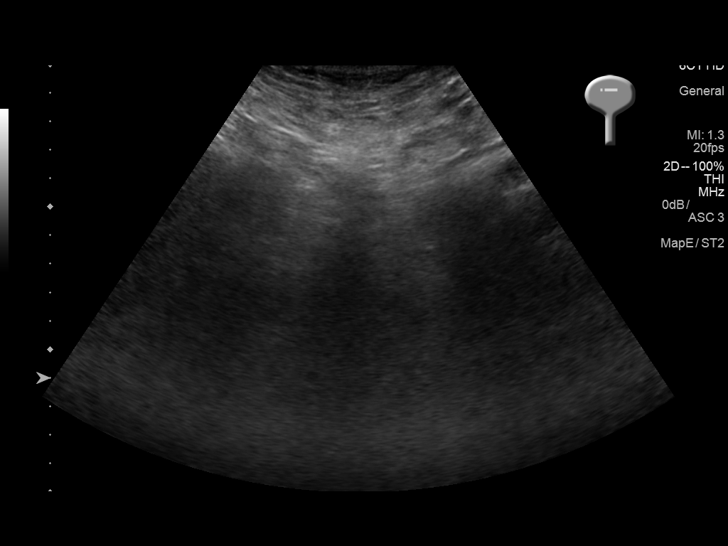

[14 of 23 positions shown; findings below may reference images not displayed]

FINDINGS: Right Kidney:

Length: 12.6 cm. Echogenicity within normal limits. Mild thinning of
the renal parenchyma. No mass or hydronephrosis visualized.

Left Kidney:

Length: 10.9 cm. No hydronephrosis. Shadowing stone in the lower
pole measures 9 mm. 2.9 cm cyst in the lower pole. No evidence of
solid lesion.

Bladder:

Not visualized likely nondistended.
IMPRESSION: 1. No obstructive uropathy.
2. Nonobstructing left renal stone.  Left renal cyst.

## 2018-01-07 IMAGING — CT CT ANGIO CHEST
2 of 7 series · 14 of 36 positions shown · IV contrast (APPLIED)
Comparison: None.

CLINICAL DATA: 82-year-old female with history of severe aortic
stenosis. Preprocedural study prior to potential transcatheter
aortic valve replacement (TAVR) procedure.

EXAM:
CT ANGIOGRAPHY CHEST, ABDOMEN AND PELVIS
TECHNIQUE: Multidetector CT imaging through the chest, abdomen and pelvis was
performed using the standard protocol during bolus administration of
intravenous contrast. Multiplanar reconstructed images and MIPs were
obtained and reviewed to evaluate the vascular anatomy.
CONTRAST:  50 mL of Isovue 370.

[Series 3: ax thins · axial · 0.59mm/px · z∈[-509,-1]mm · 13 of 572 slices shown]
[im 32/572  lung]
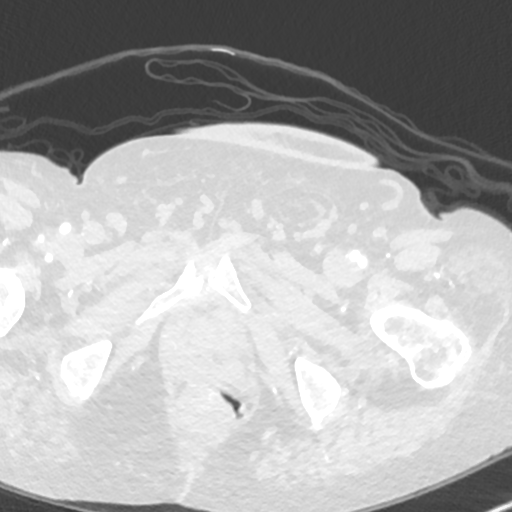
[im 64/572  mediastinal]
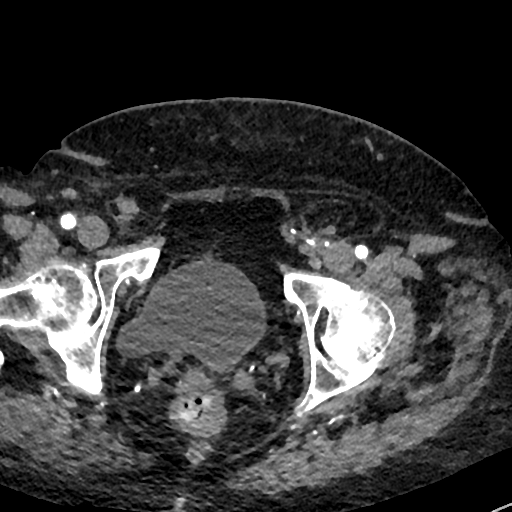
[im 127/572  lung]
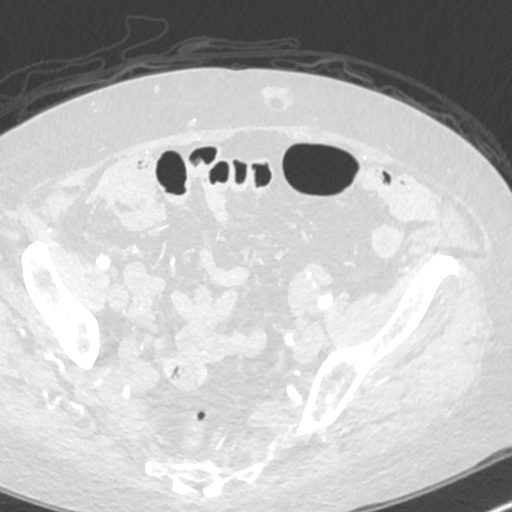
[im 159/572  mediastinal]
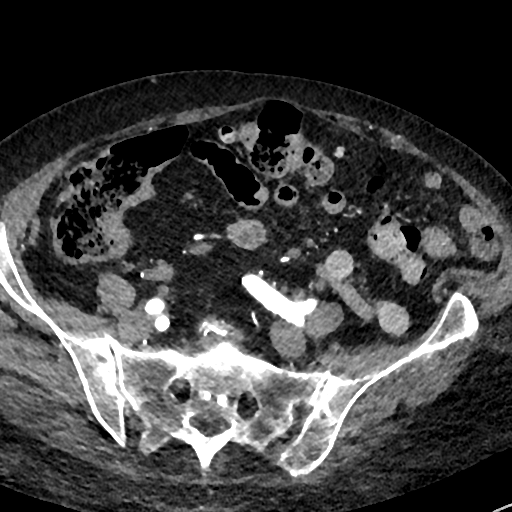
[im 191/572  lung]
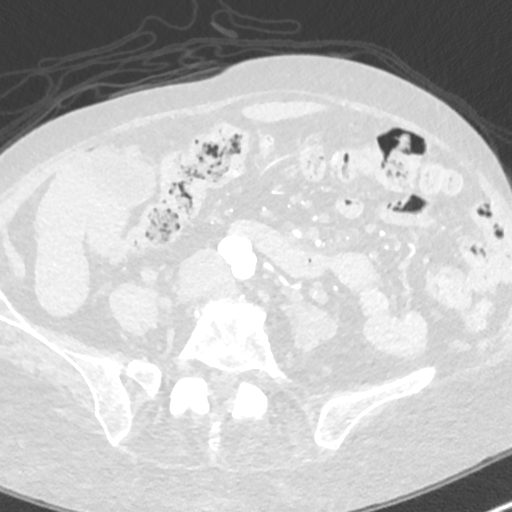
[im 254/572  mediastinal]
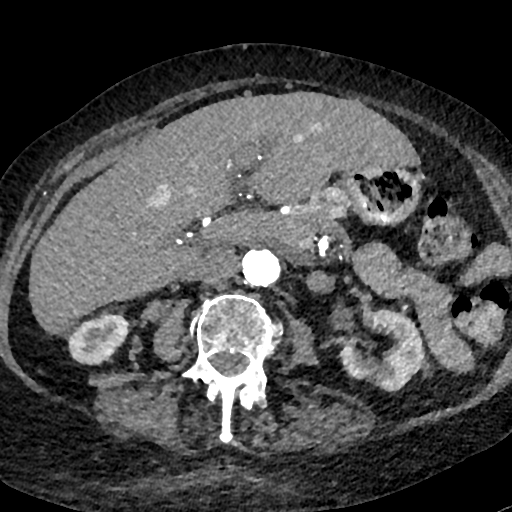
[im 286/572  lung]
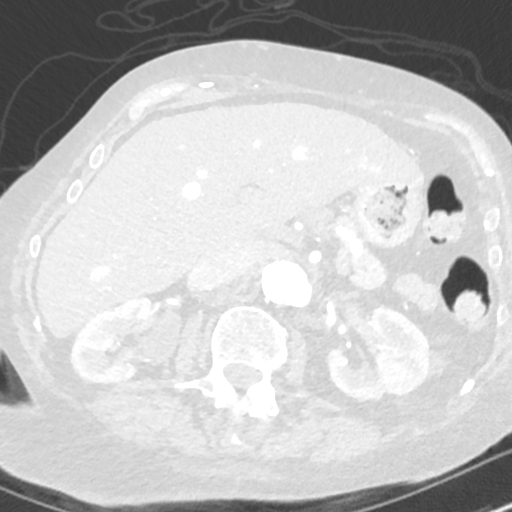
[im 318/572  mediastinal]
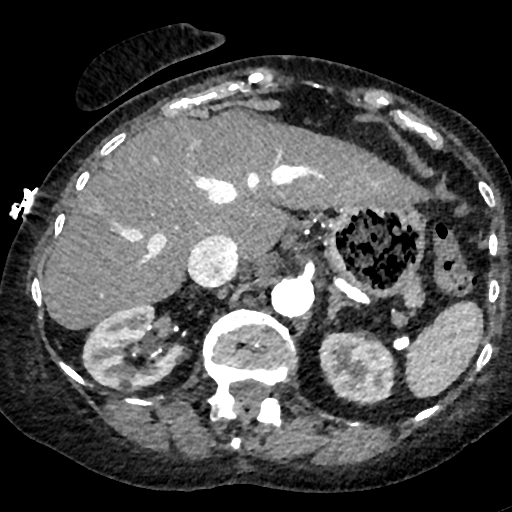
[im 381/572  lung]
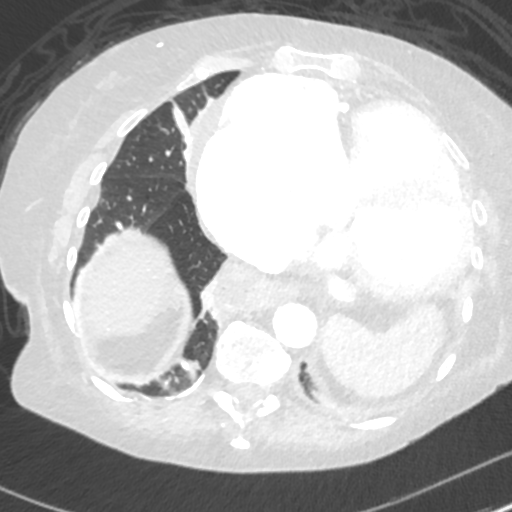
[im 413/572  mediastinal]
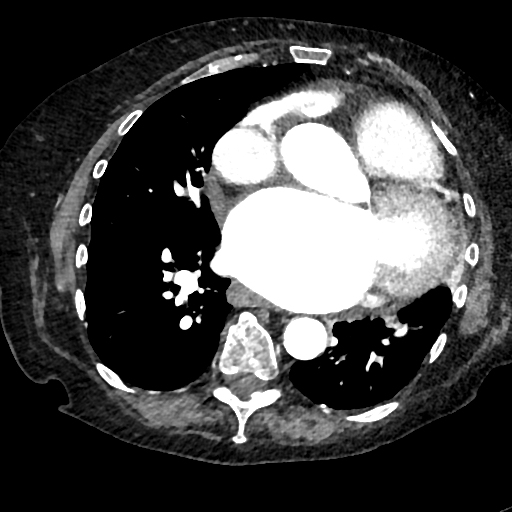
[im 445/572  lung]
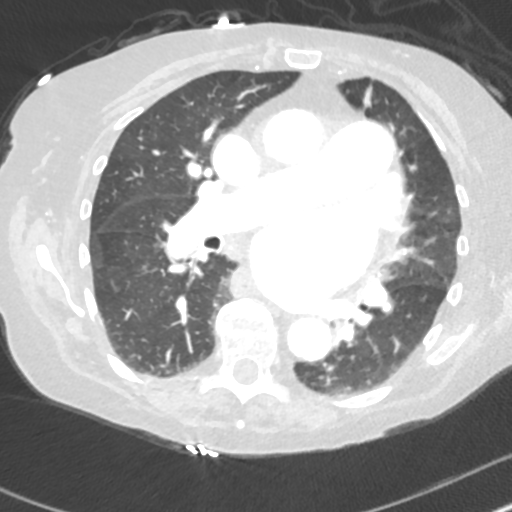
[im 508/572  mediastinal]
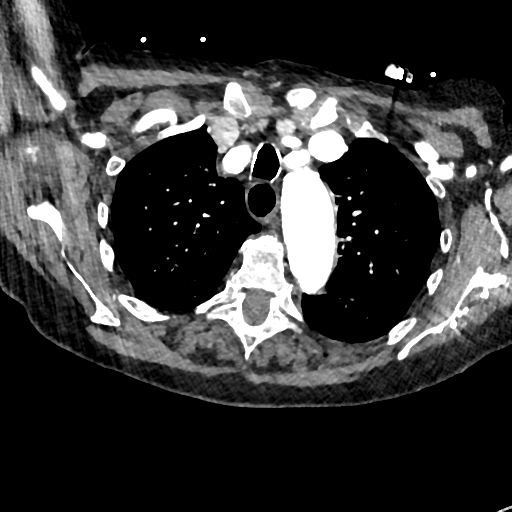
[im 540/572  lung]
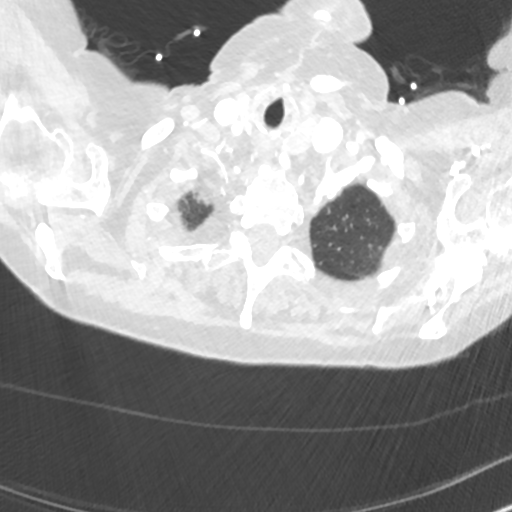

[Series 6: cor · coronal · 0.63mm/px · 1 of 101 slices shown]
[im 51/101  mediastinal]
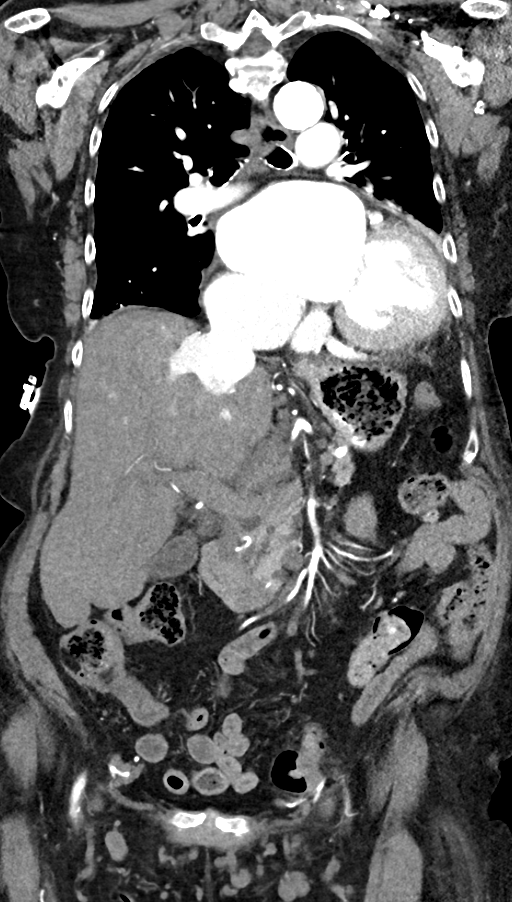

[14 of 36 positions shown; findings below may reference images not displayed]

FINDINGS: CTA CHEST FINDINGS

Cardiovascular: Heart size is severely enlarged with severe biatrial
dilatation. There is no significant pericardial fluid, thickening or
pericardial calcification. There is aortic atherosclerosis, as well
as atherosclerosis of the great vessels of the mediastinum and the
coronary arteries, including calcified atherosclerotic plaque in the
left anterior descending, left circumflex and right coronary
arteries. Severe thickening calcification of the aortic valve.

Mediastinum/Lymph Nodes: No pathologically enlarged mediastinal or
hilar lymph nodes. None small hiatal hernia. No axillary
lymphadenopathy.

Lungs/Pleura: Scattered areas of linear scarring are noted
throughout the lungs bilaterally, similar to the prior study. There
is some peribronchovascular micronodularity again noted in the left
upper lobe, also similar to the prior study, presumably areas of
chronic mucoid impaction. There is a background of mild diffuse
interlobular septal thickening, presumably indicative of mild
interstitial pulmonary edema. No confluent consolidative airspace
disease. No pleural effusions. Small calcified pleural plaques are
noted in the thorax bilaterally, indicative of asbestos related
pleural disease.

Musculoskeletal/Soft Tissues: There are no aggressive appearing
lytic or blastic lesions noted in the visualized portions of the
skeleton.

CTA ABDOMEN AND PELVIS FINDINGS

Hepatobiliary: Liver has a slightly nodular contour, indicative of
underlying cirrhosis. No discrete cystic or solid hepatic lesions.
No intra or extrahepatic biliary ductal dilatation. 11 mm calcified
gallstone lying dependently in the gallbladder. Gallbladder is not
distended. Trace volume of perihepatic ascites.

Pancreas: Multiple small low-attenuation lesions are again noted in
the pancreatic head and tail, largest of which measure 8 x 14 mm in
the uncinate process (axial image 115 of series 4), similar to the
prior examination. No larger more suspicious appearing pancreatic
mass. No pancreatic ductal dilatation. No pancreatic or
peripancreatic fluid or inflammatory changes.

Spleen: Unremarkable.

Adrenals/Urinary Tract: Exophytic 3.2 cm low-attenuation lesion in
the lower pole of the left kidney does not enhance, compatible with
a simple cyst. Multiple subcentimeter low-attenuation lesions in
both kidneys are too small to definitively characterize, but are
similar to the prior examination, likely to represent tiny cysts.
Small dystrophic cortical calcification measuring 4 mm in the
posterior aspect of the lower pole of the left kidney is unchanged.
Nonobstructive calculus in the lower pole collecting system of left
kidney measuring 7 mm. No hydroureteronephrosis. Urinary bladder is
normal in appearance. Bilateral adrenal glands are normal in
appearance.

Stomach/Bowel: Normal appearance of the stomach. No pathologic
dilatation of small bowel or colon. Numerous colonic diverticulae
are noted, without surrounding inflammatory changes to suggest an
acute diverticulitis at this time. Normal appendix.

Vascular/Lymphatic: Aortic atherosclerosis, without evidence of
aneurysm or dissection in the abdominal or pelvic vasculature.
Vascular findings and measurements pertinent to potential TAVR
procedure, as detailed below. No lymphadenopathy noted in the
abdomen or pelvis.

Reproductive: Status posthysterectomy.  Ovaries are atrophic.

Other: Trace volume of perihepatic ascites.  No pneumoperitoneum.

Musculoskeletal: Chronic compression fracture of L1 with
approximately 40% loss of anterior vertebral body height, similar to
the prior study. There are no aggressive appearing lytic or blastic
lesions noted in the visualized portions of the skeleton.

VASCULAR MEASUREMENTS PERTINENT TO TAVR:

AORTA:

Minimal Aortic Diameter -  16 x 16 mm

Severity of Aortic Calcification -  severe

RIGHT PELVIS:

Right Common Iliac Artery -

Minimal Diameter - 12.1 x 11.0 mm

Tortuosity - mild

Calcification - moderate to severe

Right External Iliac Artery -

Minimal Diameter - 6.9 x 6.6 mm

Tortuosity - severe

Calcification - minimal

Right Common Femoral Artery -

Minimal Diameter - 7.2 x 7.9 mm

Tortuosity - mild

Calcification - minimal

LEFT PELVIS:

Left Common Iliac Artery -

Minimal Diameter - 9.4 x 9.7 mm

Tortuosity - moderate

Calcification - moderate

Left External Iliac Artery -

Minimal Diameter - 6.4 x 6.3 mm

Tortuosity - severe

Calcification - none

Left Common Femoral Artery -

Minimal Diameter - 6.6 x 7.1 mm

Tortuosity - mild

Calcification - minimal

Review of the MIP images confirms the above findings.
IMPRESSION: 1. Vascular findings and measurements pertinent to potential TAVR
procedure, as detailed above. This patient does appear to have
suitable pelvic arterial access bilaterally.
2. Severe thickening and calcification of the aortic valve,
compatible with reported clinical history of severe aortic stenosis.
3. Severe cardiomegaly with severe biatrial dilatation. Evidence of
mild interstitial pulmonary edema in the lungs, indicative of mild
congestive heart failure.
4. Morphologic changes in the liver compatible with underlying
cirrhosis. Trace volume of perihepatic ascites.
5. Cholelithiasis.
6. Colonic diverticulosis without evidence of acute diverticulitis
at this time.
7. 7 mm nonobstructive calculus in the lower pole collecting system
of the left kidney.
8. Additional incidental findings, as above.
Aortic Atherosclerosis ([id number]).

## 2018-01-31 LAB — BLOOD GAS, VENOUS
Acid-Base Excess: 0.1 mmol/L (ref 0.0–2.0)
Bicarbonate: 27.1 mmol/L (ref 20.0–28.0)
O2 Saturation: 33.4 %
PATIENT TEMPERATURE: 98.6
pCO2, Ven: 61.6 mmHg — ABNORMAL HIGH (ref 44.0–60.0)
pH, Ven: 7.266 (ref 7.250–7.430)
